# Patient Record
Sex: Male | Born: 1945
Health system: Southern US, Community
[De-identification: ages and names within clinical notes are randomized; demographics above are authoritative.]

## PROBLEM LIST (undated history)

## (undated) DIAGNOSIS — E119 Type 2 diabetes mellitus without complications: Secondary | ICD-10-CM

## (undated) DIAGNOSIS — E7800 Pure hypercholesterolemia, unspecified: Secondary | ICD-10-CM

## (undated) DIAGNOSIS — G8929 Other chronic pain: Secondary | ICD-10-CM

## (undated) DIAGNOSIS — F32A Depression, unspecified: Secondary | ICD-10-CM

## (undated) DIAGNOSIS — E785 Hyperlipidemia, unspecified: Secondary | ICD-10-CM

## (undated) DIAGNOSIS — I10 Essential (primary) hypertension: Secondary | ICD-10-CM

## (undated) DIAGNOSIS — F419 Anxiety disorder, unspecified: Secondary | ICD-10-CM

## (undated) DIAGNOSIS — F329 Major depressive disorder, single episode, unspecified: Secondary | ICD-10-CM

## (undated) DIAGNOSIS — I219 Acute myocardial infarction, unspecified: Secondary | ICD-10-CM

## (undated) DIAGNOSIS — I2510 Atherosclerotic heart disease of native coronary artery without angina pectoris: Secondary | ICD-10-CM

## (undated) DIAGNOSIS — M545 Low back pain, unspecified: Secondary | ICD-10-CM

## (undated) HISTORY — DX: Anxiety disorder, unspecified: F41.9

## (undated) HISTORY — DX: Type 2 diabetes mellitus without complications: E11.9

## (undated) HISTORY — PX: EYE SURGERY: SHX253

## (undated) HISTORY — PX: COLON SURGERY: SHX602

## (undated) HISTORY — PX: HERNIA REPAIR: SHX51

## (undated) HISTORY — PX: UMBILICAL HERNIA REPAIR: SHX196

## (undated) HISTORY — PX: BACK SURGERY: SHX140

## (undated) HISTORY — DX: Hyperlipidemia, unspecified: E78.5

---
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
## 1993-02-06 HISTORY — PX: LUMBAR DISC SURGERY: SHX700

## 2003-02-07 HISTORY — PX: CORONARY ANGIOPLASTY WITH STENT PLACEMENT: SHX49

## 2003-04-23 ENCOUNTER — Ambulatory Visit (HOSPITAL_COMMUNITY): Admission: RE | Admit: 2003-04-23 | Discharge: 2003-04-23 | Payer: Self-pay | Admitting: Internal Medicine

## 2004-01-27 DIAGNOSIS — I219 Acute myocardial infarction, unspecified: Secondary | ICD-10-CM

## 2004-01-27 HISTORY — DX: Acute myocardial infarction, unspecified: I21.9

## 2004-01-28 ENCOUNTER — Inpatient Hospital Stay (HOSPITAL_COMMUNITY): Admission: AD | Admit: 2004-01-28 | Discharge: 2004-01-31 | Payer: Self-pay | Admitting: Cardiology

## 2004-01-28 ENCOUNTER — Encounter: Payer: Self-pay | Admitting: Emergency Medicine

## 2004-01-28 ENCOUNTER — Ambulatory Visit: Payer: Self-pay | Admitting: Cardiology

## 2004-02-11 ENCOUNTER — Ambulatory Visit: Payer: Self-pay | Admitting: *Deleted

## 2004-03-10 ENCOUNTER — Ambulatory Visit: Payer: Self-pay | Admitting: Internal Medicine

## 2004-04-28 ENCOUNTER — Ambulatory Visit: Payer: Self-pay | Admitting: *Deleted

## 2004-05-13 ENCOUNTER — Ambulatory Visit (HOSPITAL_COMMUNITY): Admission: RE | Admit: 2004-05-13 | Discharge: 2004-05-13 | Payer: Self-pay | Admitting: Pulmonary Disease

## 2004-07-25 ENCOUNTER — Ambulatory Visit: Payer: Self-pay | Admitting: *Deleted

## 2004-09-06 ENCOUNTER — Ambulatory Visit: Payer: Self-pay | Admitting: *Deleted

## 2004-10-28 ENCOUNTER — Ambulatory Visit: Payer: Self-pay | Admitting: Internal Medicine

## 2005-01-25 ENCOUNTER — Ambulatory Visit: Payer: Self-pay | Admitting: *Deleted

## 2005-04-21 ENCOUNTER — Ambulatory Visit: Payer: Self-pay | Admitting: Cardiology

## 2007-11-07 ENCOUNTER — Ambulatory Visit: Payer: Self-pay | Admitting: Cardiology

## 2007-11-13 ENCOUNTER — Encounter (HOSPITAL_COMMUNITY): Admission: RE | Admit: 2007-11-13 | Discharge: 2007-12-13 | Payer: Self-pay | Admitting: Cardiology

## 2007-11-13 ENCOUNTER — Ambulatory Visit: Payer: Self-pay | Admitting: Cardiology

## 2007-11-27 ENCOUNTER — Ambulatory Visit: Payer: Self-pay | Admitting: Cardiology

## 2008-05-21 ENCOUNTER — Emergency Department (HOSPITAL_COMMUNITY): Admission: EM | Admit: 2008-05-21 | Discharge: 2008-05-21 | Payer: Self-pay | Admitting: Emergency Medicine

## 2009-02-06 HISTORY — PX: COLONOSCOPY: SHX174

## 2009-05-05 ENCOUNTER — Ambulatory Visit: Payer: Self-pay | Admitting: Internal Medicine

## 2009-05-19 ENCOUNTER — Ambulatory Visit: Payer: Self-pay | Admitting: Internal Medicine

## 2009-05-19 ENCOUNTER — Ambulatory Visit (HOSPITAL_COMMUNITY): Admission: RE | Admit: 2009-05-19 | Discharge: 2009-05-19 | Payer: Self-pay | Admitting: Internal Medicine

## 2009-05-20 ENCOUNTER — Encounter: Payer: Self-pay | Admitting: Internal Medicine

## 2009-07-19 ENCOUNTER — Ambulatory Visit (HOSPITAL_COMMUNITY): Admission: RE | Admit: 2009-07-19 | Discharge: 2009-07-19 | Payer: Self-pay | Admitting: Pulmonary Disease

## 2009-12-17 ENCOUNTER — Inpatient Hospital Stay (HOSPITAL_COMMUNITY): Admission: EM | Admit: 2009-12-17 | Discharge: 2009-12-18 | Payer: Self-pay | Admitting: Emergency Medicine

## 2010-02-06 HISTORY — PX: KNEE ARTHROSCOPY: SHX127

## 2010-03-08 NOTE — Letter (Signed)
Summary: Patient Notice, Colon Biopsy Results  [facility]  [address]   [place], [place] [address]   Phone: [phone]  Fax: [phone]       May 20, 2009   [address] [place], [place]  [address] [birth year]    Dear Steven Stevens,  I am pleased to inform you that the biopsies taken during your recent colonoscopy did not show any evidence of cancer upon pathologic examination.  Additional information/recommendations:  You should have a repeat colonoscopy examination  in 5 years.  Please call us if you are having persistent problems or have questions about your condition that have not been fully answered at this time.  Sincerely,    R. Roetta Sessions MD, FACP [facility] Ph: [phone]    Fax: [phone]   Appended Document: Patient Notice, Colon Biopsy Results letter mailed to pt  Appended Document: Patient Notice, Colon Biopsy Results reminder in computer

## 2010-03-08 NOTE — Assessment & Plan Note (Signed)
Summary: NEEDS TC,HAVING PROBLEMS.GU   Visit Type:  Initial Visit Primary Care Provider:  Juanetta Gosling  Chief Complaint:  Time for TCS.  History of Present Illness: 65 year old gentleman returns to set up a high-risk screening colonoscopy. He underwent colonoscopy by me back in 2005. He was found to have a normal rectum and colon aside from a diminutive polyp at the hepatic flexure. This was biopsied/removed. This came back as a large lymphoid aggregated on pathology. Steven Stevens  has done well since last being seen here. He has any rectal bleeding or other GI symptoms. He is very concerned about getting his colon checked given his mother's history of CRC diagnosed in her 22s. Since I last saw this nice gentleman, he did have a myocardial infarction and now has a stent and takes Plavix.   Current Medications (verified): 1)  Hyzaar 50-12.5 Mg Tabs (Losartan Potassium-Hctz) .... Take 1 Tablet By Mouth Once A Day 2)  Hytrin 5 Mg .... Take 1 Tablet By Mouth Once A Day 3)  Crestor 10 Mg Tabs (Rosuvastatin Calcium) .... Take 1 Tablet By Mouth Once A Day 4)  Zetia 10 Mg Tabs (Ezetimibe) .... Take 1 Tablet By Mouth Once A Day 5)  Plavix 75 Mg Tabs (Clopidogrel Bisulfate) .... Take 1 Tablet By Mouth Once A Day 6)  Asa 325 Mg .... Take 1 Tablet By Mouth Once A Day 7)  Metoprolol Tartrate 25 Mg Tabs (Metoprolol Tartrate) .... Take 1 Tablet By Mouth Once A Day 8)  Alprazolam 1 Mg Tabs (Alprazolam) .... Take 1 Tablet By Mouth Two Times A Day 9)  Glucovance 5-500 Mg Tabs (Glyburide-Metformin) .... Take 1 Tablet By Mouth Two Times A Day 10)  Flax Seed Oil .... Take 1 Tablet By Mouth Once A Day 11)  Vicodin Hp 10-660 Mg Tabs (Hydrocodone-Acetaminophen) .... Take 1 Tablet By Mouth Four Times A Day As Needed  Allergies (verified): No Known Drug Allergies  Past History:  Family History: Last updated: June 03, 2009 Father: Deceased age 86   CHF Mother: Deceased age 93  Colon Cancer Siblings: 3 brothers        one deceased age 49 aneurysm  Social History: Last updated: 06/03/09 Marital Status: Married Children: 3   healthy Occupation: Retired      Press photographer  Past Medical History: Diabetic  hypertension Hypercholesteremia MI Anxiety Back problems  (discs removed in 1998)  Past Surgical History: Back Surgery Umbilical Hernia Heart stents  01/2004  Family History: Father: Deceased age 33   CHF Mother: Deceased age 44  Colon Cancer Siblings: 3 brothers       one deceased age 83 aneurysm  Social History: Marital Status: Married Children: 3   healthy Occupation: Retired      Press photographer  Vital Signs:  Patient profile:   65 year old male Height:      68.5 inches Weight:      321 pounds BMI:     48.27 Temp:     97.9 degrees F oral Pulse rate:   64 / minute BP sitting:   120 / 78  (left arm) Cuff size:   large  Vitals Entered By: Cloria Spring LPN (06-03-09 3:16 PM)  Physical Exam  General:  very pleasant alert well groomed gentleman resting comfortably Eyes:  no scleral icterus. Lungs:  clear to auscultation Heart:  regular rate and rhythm without murmur gallop rub Abdomen:  obese positive bowel sounds soft, nontender without appreciable mass or organomegaly Rectal:  deferred until time of colonoscopy.  Impression & Recommendations: Impression: 65 year old gentleman presents for a screening colonoscopy.  History of colon cancer in his mother but at advanced age. The patient very much wants to have another colonoscopy at this time although guidelines have now changed somewhat since 2005 examination. He is currently not having a lower GI tract symptoms.  Recommendations: All things considered, we'll go ahead and off for Steven Stevens a screening colonoscopy at this time. Risk, benefits, limitations, alternatives and imponderables have been reviewed. His questions have been answered. He is agreeable. We'll set up a screening colonoscopy the very near future. Further  recommendations to follow.         Appended Document: Orders Update    Clinical Lists Changes  Orders: Added new Service order of New Patient Level III [phone]) - Signed

## 2010-03-11 NOTE — Letter (Signed)
Summary: TCS ORDER  TCS ORDER   Imported By: Ave Filter 05/05/2009 16:07:06  _____________________________________________________________________  External Attachment:    Type:   Image     Comment:   External Document

## 2010-04-19 LAB — BASIC METABOLIC PANEL
Creatinine, Ser: 1.14 mg/dL (ref 0.4–1.5)
GFR calc Af Amer: >60 mL/min (ref >60)
Glucose, Bld: 107 mg/dL — ABNORMAL HIGH (ref 70–99)
Potassium: 3.7 mEq/L (ref 3.5–5.1)
Sodium: 138 mEq/L (ref 135–145)

## 2010-04-19 LAB — URINALYSIS, ROUTINE W REFLEX MICROSCOPIC
Bilirubin Urine: NEGATIVE
Glucose, UA: NEGATIVE mg/dL
Protein, ur: NEGATIVE mg/dL
Specific Gravity, Urine: 1.025 (ref 1.005–1.030)
pH: 5.5 (ref 5.0–8.0)

## 2010-04-19 LAB — CARDIAC PANEL(CRET KIN+CKTOT+MB+TROPI)
CK, MB: 3 ng/mL (ref 0.3–4.0)
CK, MB: 3.2 ng/mL (ref 0.3–4.0)
Relative Index: 0.9 (ref 0.0–2.5)
Troponin I: 0.04 ng/mL (ref 0.00–0.06)

## 2010-04-19 LAB — URINE CULTURE
Colony Count: >=100000
Culture  Setup Time: 201111120544

## 2010-04-19 LAB — DIFFERENTIAL
Lymphocytes Relative: 42 % (ref 12–46)
Monocytes Relative: 8 % (ref 3–12)
Neutrophils Relative %: 47 % (ref 43–77)

## 2010-04-19 LAB — CBC
MCH: 30 pg (ref 26.0–34.0)
MCV: 89.1 fL (ref 78.0–100.0)
RBC: 4.45 MIL/uL (ref 4.22–5.81)
WBC: 8.7 10*3/uL (ref 4.0–10.5)

## 2010-04-19 LAB — POCT CARDIAC MARKERS: Myoglobin, poc: 81.3 ng/mL (ref 12–200)

## 2010-04-19 LAB — BRAIN NATRIURETIC PEPTIDE: Pro B Natriuretic peptide (BNP): <30 pg/mL (ref 0.0–100.0)

## 2010-04-19 LAB — GLUCOSE, CAPILLARY

## 2010-04-19 LAB — URINE MICROSCOPIC-ADD ON

## 2010-04-22 ENCOUNTER — Other Ambulatory Visit (HOSPITAL_COMMUNITY): Payer: Self-pay | Admitting: Pulmonary Disease

## 2010-04-22 DIAGNOSIS — M25561 Pain in right knee: Secondary | ICD-10-CM

## 2010-04-26 ENCOUNTER — Other Ambulatory Visit (HOSPITAL_COMMUNITY): Payer: Self-pay

## 2010-05-18 LAB — DIFFERENTIAL
Eosinophils Relative: 1 % (ref 0–5)
Lymphocytes Relative: 24 % (ref 12–46)
Lymphs Abs: 2.1 10*3/uL (ref 0.7–4.0)
Monocytes Absolute: 0.5 10*3/uL (ref 0.1–1.0)
Neutro Abs: 5.8 10*3/uL (ref 1.7–7.7)
Neutrophils Relative %: 69 % (ref 43–77)

## 2010-05-18 LAB — URINALYSIS, ROUTINE W REFLEX MICROSCOPIC
Bilirubin Urine: NEGATIVE
Glucose, UA: NEGATIVE mg/dL
Leukocytes, UA: NEGATIVE
Nitrite: NEGATIVE
Specific Gravity, Urine: 1.02 (ref 1.005–1.030)
pH: 5.5 (ref 5.0–8.0)

## 2010-05-18 LAB — CBC
HCT: 38.5 % — ABNORMAL LOW (ref 39.0–52.0)
MCHC: 34.2 g/dL (ref 30.0–36.0)
MCV: 90.2 fL (ref 78.0–100.0)
RBC: 4.28 MIL/uL (ref 4.22–5.81)
RDW: 14.9 % (ref 11.5–15.5)
WBC: 8.4 10*3/uL (ref 4.0–10.5)

## 2010-05-18 LAB — COMPREHENSIVE METABOLIC PANEL
Chloride: 102 mEq/L (ref 96–112)
Creatinine, Ser: 1.38 mg/dL (ref 0.4–1.5)
GFR calc Af Amer: >60 mL/min (ref >60)
GFR calc non Af Amer: 52 mL/min — ABNORMAL LOW (ref >60)
Potassium: 3.8 mEq/L (ref 3.5–5.1)

## 2010-05-18 LAB — LIPASE, BLOOD: Lipase: 24 U/L (ref 11–59)

## 2010-05-18 LAB — URINE MICROSCOPIC-ADD ON

## 2010-06-21 NOTE — Assessment & Plan Note (Signed)
[facility]                       [place] CARDIOLOGY OFFICE NOTE   LAJUAN, KOVALESKI                      MRN:          [id number]  DATE:11/27/2007                            DOB:          14-Nov-1945    PRIMARY CARE PHYSICIAN:  Ramon Dredge L. Juanetta Gosling, MD   REASON OF VISIT:  Followup cardiac testing.   HISTORY OF PRESENT ILLNESS:  Mr. Goodlin returns after his recent Myoview  following our last visit earlier in the month.  He had no diagnostic  electrocardiographic changes with exercise achieving 7 METS and a peak  heart rate of 136 beats per minute, which was 85% of his maximum  predicted heart rate.  He did have a small inferior/inferior apical  defect most consistent with scar with some evidence of ischemia and this  would be very consistent with his previous coronary anatomy.  There was  some increase in the transient ischemic dilatation ratio suggesting the  possibility of balanced ischemia.  This may be reflective of his  concurrent occlusion of the mid left anterior descending with distal  collateralization.  Symptomatically, he reports having less shortness of  breath.  He has been trying to do some walking, up to a mile at a time  since I saw him.  Today we discussed the testing and, at this point, we  will continue medical therapy and observation of symptoms.  Certainly,  if he has progression, we can consider a cardiac catheterization.   ALLERGIES:  No known drug allergies.   MEDICATIONS:  1. Aspirin 325 mg p.o. daily.  2. Crestor 10 mg p.o. daily.  3. Terazosin 5 mg p.o. daily.  4. Hyzaar 50/12.5 mg p.o. daily.  5. Zetia 10 mg p.o. daily.  6. Metoprolol 12.5 mg p.o. b.i.d.  7. Plavix 75 mg p.o. daily.  8. Glyburide/metformin 5/500 mg p.o. b.i.d.  9. Xanax 1 mg p.o. p.r.n.   REVIEW OF SYSTEMS:  As described in the history of present illness,  otherwise negative.   PHYSICAL EXAMINATION:  VITAL SIGNS:  Blood pressure is 118/78,  heart  rate is 66, weight is 308 pounds.  GENERAL:  This is a morbidly obese male in no acute distress.  NECK:  No elevated jugular venous pressure.  LUNGS:  Clear without labored breathing.  CARDIAC:  Regular rate and rhythm.  No S3 gallop or murmur.  EXTREMITIES:  Exhibit no significant pitting edema.   IMPRESSION AND RECOMMENDATIONS:  Coronary disease status post drug-  eluting stent placement to the mid-to-distal right coronary artery in  2005 with known residual occlusion of the mid left anterior descending  associated with distal collateralization.  Recent Myoview results are  noted above.  At this point, would plan to continue medical therapy.  I  will plan to see him back over the next 6 months.  He will let me know  in advance if symptoms worsen.     Jonelle Sidle, MD  Electronically Signed    SGM/MedQ  DD: 11/27/2007  DT: 11/28/2007  Job #: [id number]   cc:   Ramon Dredge L. Juanetta Gosling, M.D.

## 2010-06-21 NOTE — Assessment & Plan Note (Signed)
[facility]                       [place] CARDIOLOGY OFFICE NOTE   Steven, Stevens                      MRN:          [id number]  DATE:11/07/2007                            DOB:          Jun 22, 1945    PRIMARY CARE PHYSICIAN:  Ramon Dredge L. Juanetta Gosling, MD   REASON FOR VISIT:  Routine cardiac followup.   HISTORY OF PRESENT ILLNESS:  This is my first meeting with [name].  He is a very pleasant gentleman last seen in the office in 2006.  He has  a history of coronary artery disease status post presentation with an  acute coronary syndrome back in 2005, at which time he underwent cardiac  catheterization which demonstrated an ejection fraction of 55% with  inferior basal hypokinesis, total occlusion of the mid-to-distal right  coronary artery with severe distal disease at the point of the posterior  descending branch.  There were left-to-right collaterals to this area,  and ultimately the patient underwent placement of a drug-eluting stent  with successful angiographic results.  Also noted was chronic  collateralization of the distal left anterior descending, which had a  40% eccentric stenosis after the first diagonal with occlusion of the  vessel distal to the second diagonal branch.  He has actually done  fairly well on medical therapy.  Symptomatically, he has been stable  with the exception of progressive dyspnea on exertion.  He does note  that he has not been exercising as regularly as he was approximately 3  months ago due to some problems on his back.  He has been on a good  medical regiment with lipids followed under the direction of Dr.  Juanetta Gosling.  His last LDL was 73 in September 2008.  He is now on an oral  hypoglycemic medicine with diagnosis of diabetes mellitus since he was  last seen here.  He has not had a followup ischemic evaluation.   ALLERGIES:  No known drug allergies.   PRESENT MEDICATIONS:  1. Aspirin 325 mg p.o. daily.  2. Crestor 10 mg p.o. daily.  3. Terazosin 5 mg p.o. daily.  4. Hyzaar 50/12.5 mg p.o. daily.  5. Zetia 10 mg p.o. daily.  6. Metoprolol 12.5 mg p.o. b.i.d.  7. Plavix 75 mg p.o. daily.  8. Glyburide/metformin  5/500 mg p.o. b.i.d.  9. Xanax 1 mg p.r.n.  10.Hydrocodone p.r.n.  11.Previous Levitra p.r.n.   REVIEW OF SYSTEMS:  As described in the history of present illness.  He  still has difficulties with erectile dysfunction.  No palpitations,  orthopnea, or PND.  Otherwise negative.   PHYSICAL EXAMINATION:  VITAL SIGNS:  Blood pressure today is 118/80,  heart rate is 70 and regular, weight is 309 pounds up from 294.  GENERAL:  He is a morbidly obese male in no acute distress.  HEENT:  Conjunctiva is normal.  Oropharynx is clear.  NECK:  Supple.  No elevated jugular venous pressure.  No loud bruits.  No thyromegaly is noted.  LUNGS:  Clear without labored breathing at rest.  CARDIAC:  Regular rate and rhythm.  No S3, gallop or pericardial rub.  ABDOMEN:  Obese.  Unable to adequately palpate liver edge.  Bowel sounds  present.  EXTREMITIES:  No trace edema.  Distal pulses are 2+.  SKIN:  Warm and dry.  MUSCULOSKELETAL:  No kyphosis noted.  NEUROPSYCHIATRIC:  The patient is alert and oriented x3.  Affect is  appropriate.   IMPRESSION AND RECOMMENDATIONS:  1. Cardiovascular disease status post drug-eluting stent placement to      the mid-to-distal right coronary artery back in 2005.  The patient      has residual occlusion of the mid left anterior descending with      collateralization distally, and he has done fairly well      symptomatically since then with previously documented normal      ejection fraction of 55%.  He is now experiencing dyspnea on      exertion over the last few months, although no frank angina.  In      the meanwhile, he has also been diagnosed with diabetes mellitus.      His medical regimen looks good, and we talked about proceeding with      a followup  adenosine Myoview to assess for ischemic burden.  I will      have him come back to the office over the next few weeks to discuss      the results and plan.  2. Hyperlipidemia, on Crestor.  This has been followed by Dr. Juanetta Gosling.      I would aim for aggressive LDL control around 70.  3. Hypertension, well controlled today.     Jonelle Sidle, MD  Electronically Signed    SGM/MedQ  DD: 11/07/2007  DT: 11/08/2007  Job #: [id number]   cc:   Ramon Dredge L. Juanetta Gosling, M.D.

## 2010-06-24 NOTE — Cardiovascular Report (Signed)
NAMEHYMAN, CROSSAN NO.:  [id number]   MEDICAL RECORD NO.:  [id number]          PATIENT TYPE:  OIB   LOCATION:  2931                         FACILITY:  [facility]   PHYSICIAN:  Arturo Morton. Riley Kill, M.D. [facility] OF BIRTH:  1946-01-23   DATE OF PROCEDURE:  01/28/2004  DATE OF DISCHARGE:                              CARDIAC CATHETERIZATION   INDICATIONS:  Mr. Elzey is a 65 year old gentleman who presents with chest  pain.  He has not had definite ST abnormalities, but enzymes have been  positive.  He was transferred to the cardiac catheterization for further  evaluation and treatment.   PROCEDURES:  1.  Left heart catheterization.  2.  Selective coronary arteriography.  3.  Selective left ventriculography.  4.  Temporary transvenous pacer implantation.  5.  Percutaneous stenting of the right coronary artery.   DESCRIPTION OF PROCEDURE:  The patient was brought to the catheterization  lab and prepped and draped in the usual fashion.  Through an anterior  puncture, the  right femoral artery was easily entered.  Central aortic and  left ventricular pressures were measured with a pigtail.  Ventriculography  was performed in the right RAO projection.  Coronary arteriography was  performed with standard Judkins catheters.  He was noted to have a totally  occluded and what appeared to be a fresh occlusion of the right coronary  artery.  Plans were made for percutaneous intervention.  Because of  bradycardia and RCA occlusion, it was felt that a temporary transvenous  pacer would be required.  Because of this, the femoral vein was entered and  a 6 French sheath was placed.  Temporary transvenous pacer was passed to the  RV apex where it was tested and found to work appropriately.  Intravenous  heparin was given to prolong the ACT between 200 and 300 seconds with  eptifibatide.  The right appeared to be the acute lesion and therefore the  right was crossed with a Hi-Torque  Floppy wire.  Dilatations were initially  done with 2-0 balloon, then a 2.25 balloon.  We debated about whether to put  in a 2.25 non drug-eluting stent or a 2.5 drug-eluting stent.  We leaned in  the direction of a drug-eluting stent.  A 28 x 2.5 Cypher stent was then  delivered to the distal lesion and taken up to about 11 atmospheres.  Post  dilatation was done with 2.5 mm Quantum Maverick balloon up to 14  atmospheres.  There was marked improvement in the appearance of the artery  although there still remains some TIMI-2 flow in the distal vessel.  This  was thought to be possibly due to the presence of collaterals previously.  The patient's pain was much improved.  It was felt that the LAD occlusion  was not acute and therefore could be managed medically.   At the completion of the procedure, all catheters were subsequently removed  and the femoral sheath sewn into place.  The temporary transvenous pacer was  also removed.   He was taken to the holding area in satisfactory clinical condition.   HEMODYNAMIC  DATA:  1.  Central aorta:  101/69, mean 84.  2.  Left ventricle:  121/26.  3.  No aortic or left ventricular gradient on pullback across the aortic      valve.   ANGIOGRAPHIC DATA:  1.  Ventriculography was performed in the RAO projection.  There was      inferior basal hypokinesis with ejection fraction estimated at 55%.  2.  The left main was free of critical disease.  3.  The LAD has a 40% eccentric stenosis after the diagonal and before the      septal.  After the second diagonal and septal, the vessel was totally      occluded.  It fills by bridging collaterals from the septal perforators      as well as the right mid coronary vessel.  The second diagonal has 50%      segmental narrowing.  4.  The right coronary artery is fairly smooth down to the site of total      occlusion and then is totally occluded.  The distal vessel is fairly      severely diseased up to the  point of the PDA.  This entire area was      stented using a 28 x 2.5 Cypher stent and post dilated with a 2.5      Quantum Maverick.  There remains some TIMI-2 flow in the distal vessel.      We questioned whether there might be some diffuse disease in the distal      vessel prior to the PDA take off, but after careful analysis, it was      felt that there was a fairly large caliber lumen.   CONCLUSION:  1.  Acute myocardial infarction, non-ST-elevation due to occlusion of the      right coronary artery with left-to-right collaterals.  2.  Successful percutaneous stenting of the distal right coronary using a      Cypher drug-eluting stent.  3.  Chronic collateralization of the distal left anterior descending artery.   RECOMMENDATIONS:  At the present time, we will continue on the same medical  regimen.  The patient has been enrolled in the TRITAN study and has received  TRITAN study drug.  Hopefully, he will continue to do well.  He will need  close followup in the office.       TDS/MEDQ  D:  01/28/2004  T:  01/29/2004  Job:  [id number]   cc:   Ramon Dredge L. Juanetta Gosling, M.D.  [address]  [place]  [place] [address]  Fax: [phone]

## 2010-06-24 NOTE — Discharge Summary (Signed)
Steven Stevens, VANOVERBEKE NO.:  [id number]   MEDICAL RECORD NO.:  [id number]          PATIENT TYPE:  OIB   LOCATION:  2011                         FACILITY:  [facility]   PHYSICIAN:  Willa Rough, M.D.     DATE OF BIRTH:  1945-06-26   DATE OF ADMISSION:  01/28/2004  DATE OF DISCHARGE:  01/31/2004                           DISCHARGE SUMMARY - REFERRING   SUMMARY OF HISTORY:  Mr. Colan is a 65 year old African-American male who  on the evening prior to admission at approximately 11 o'clock developed a  right sternal chest discomfort.  He felt that his sensation was similar  symptoms relieved with antacids or belching that he has had over these last  1-2 weeks.  However, these did not resolve.  He went to [facility] at  approximately 8 a.m.  He received 3 sublingual nitroglycerin, aspirin,  nitroglycerin drip, IV heparin, IV Lopressor and Integrilin reducing his  discomfort for 3 on a scale of 0/10.  [facility] transported him to [facility].   HISTORY:  1.  Obesity.  2.  Hyperlipidemia.  3.  Tobacco use.   LABORATORY DATA:  At [facility] H&H was 14.5 and 41.5 normal  indices, platelets 234, WBCs 10.1.  Subsequent hematologies were stable.  His last H&H was drawn on the 23rd.  This was 13.3 and 37.8, platelets 234,  WBCs 9.7.  Admission PTT was greater than 200, PT 14.7, INR 1.2.  Sodium  128, potassium 4.2, BUN 12, creatinine 1.2.  At [facility] LFTs were drawn  on the 23rd that showed a low alkaline phosphatase at 35 and SGOT elevated  at 106.  At [facility] total CK was 1775 with an MB of 178.8,  relative index 10.1.  Subsequent CK-MBs were declining.  The first troponin  that was drawn on the 23rd was 13.57.  Hemoglobin A1C on the 23rd was 6.  Total cholesterol 219, triglycerides 193, HDL 39, LDL 141.  TSH 1.832.  EKG  on admission showed normal sinus rhythm, normal axis, inferior Q waves,  early R waves.  Subsequent EKGs were essentially  the same.  Chest x-ray  obtained on the 22nd showed mild cardiomegaly and possible mild interstitial  edema with bibasilar atelectasis.  On the 24th two-view chest x-ray was  performed showed mild COPD without edema, mild diffuse peribronchial  thickening.   HOSPITAL COURSE:  Mr. Hineman was taken emergently to the cardiac  catheterization lab by Dr. Riley Kill.  According to Dr. Rosalyn Charters note, his EF  was 55%, inferior hypokinesis, 40% proximal LAD after the first diagonal,  50% proximal second diagonal, 100% mid RCA with right and left-to-left  collaterals.  He had 100% distal RCA.  Utilizing a CYPHER stent, Dr. Riley Kill  reduced this lesion from 100% to 0% restoring TIMI II flow.  Dr. Riley Kill  noted that the patient has a past history of ADVERSE REACTION TO LIPITOR  thus Crestor was begun and to add a beta-blocker.  Post catheterization, he  was admitted to the coronary care unit.  He was placed in the TRITON  research  trial.  Tobacco cessation consult was also performed and cardiac  rehab assisted with education and ambulation.  On the 23rd and 24th the  patient wanted to go home; however, given this recent myocardial infarction  it was felt that he should stay another 24 hours.  By the 25th he was doing  well without any further problems with bradycardia, chest discomfort or  shortness of breath.  Dr. Myrtis Ser reviewed and felt that this patient could be  discharged home.   DISCHARGE DIAGNOSES:  1.  Inferior myocardial infarction status post stenting to the right      coronary artery utilizing CYPHER stent.  2.  Hyperlipidemia, Crestor initiated.  3.  Sinus bradycardia.  4.  Hypertension.  5.  History of tobacco use.  6.  History as previously.   DISPOSITION:  He is discharged home.   DISCHARGE MEDICATIONS:  1.  His medications included the TRITON study drug daily.  2.  Aspirin 325 mg daily.  3.  His Cozaar was decreased to 25 mg daily.  4.  Xanax 1 mg q.h.s.  5.  Terazosin 5 mg  daily.  6.  Crestor 10 mg daily.  7.  Nitroglycerin 0.4 mg p.r.n.  8.  Toprol-XL 25 mg daily.   He is advised no lifting, driving, sexual activity, or heavy exertion for  two weeks.  Maintain low salt, fat, cholesterol diet.  If he has any  problems with her catheterization site, he was asked to call.  The research  team will be contacting him for follow up appointment.  He was asked to call  on Tuesday to the [place] office to arrange a 2-3 week appointment with  Dr. Dorethea Clan.  He was advised no smoking or tobacco products.       EW/MEDQ  D:  01/31/2004  T:  02/01/2004  Job:  [id number]   cc:   Ramon Dredge L. Juanetta Gosling, M.D.  [address]  [place]  [place] [address]  Fax: [phone]   Arturo Morton. Riley Kill, M.D. [facility]

## 2010-06-24 NOTE — Op Note (Signed)
NAME:  Steven Stevens, RUHLMAN                         ACCOUNT NO.:  [id number]   MEDICAL RECORD NO.:  [id number]                   PATIENT TYPE:  AMB   LOCATION:  DAY                                  FACILITY:  [facility]   PHYSICIAN:  R. Roetta Sessions, M.D.              DATE OF BIRTH:  Mar 17, 1945   DATE OF PROCEDURE:  04/23/2003  DATE OF DISCHARGE:                                 OPERATIVE REPORT   PROCEDURE:  High-risk screening colonoscopy.   ENDOSCOPIST:  Gerrit Friends. Rourk, M.D.   INDICATIONS FOR PROCEDURE:  The patient is a 65 year old gentleman with no  GI symptoms whose mother succumbed to colorectal cancer at age 46.  He is  referred over for colorectal cancer screening.  He has never had his lower  GI tract imaged previously.  Colonoscopy is now being done as a screening  maneuver.  This approach has been discussed with the patient at length.  The  potential risks, benefits, and alternatives have been reviewed; questions  answered.   PROCEDURE NOTE:  O2 saturation, blood pressure, pulse and respirations were  monitored throughout the entire procedure.  Conscious sedation: Versed 2 mg IV, Demerol 50 mg IV.   INSTRUMENT:  Olympus CF-140 colonoscope.   FINDINGS:  Digital rectal exam revealed no abnormalities.   ENDOSCOPIC FINDINGS:  The prep was adequate.   RECTUM:  Examination of the rectal mucosa including the retroflex view of  the anal verge revealed no abnormalities.   COLON:  The colonic mucosa was surveyed from the rectosigmoid junction  through the left transverse and right colon to the area of the appendiceal  orifice, ileocecal valve, and cecum.  These structures were well seen and  photographed for the record.   From this level the scope was slowly withdrawn.  All previously mentioned  mucosal surfaces were again seen.  The patient was noted to have a 4-mm  polyp at the hepatic flexure and some early shallow left-sided diverticula.  The remainder of the colonic mucosa  appeared normal.  The polyp at the  hepatic flexure was cold biopsied/removed.  The patient tolerated the  procedure well and was reacted in endoscopy.   IMPRESSION:  1. Rectum, normal colon.  2. Shallow, subdiverticular dimunitive polyp in the hepatic flexure cold     biopsied/removed.  3. The remainder of the colonic mucosa appeared normal.   RECOMMENDATIONS:  1. Follow up on path.  2. Further recommendations to follow.      ___________________________________________                                            Jonathon Bellows, M.D.   RMR/MEDQ  D:  04/23/2003  T:  04/23/2003  Job:  [id number]   cc:   Ramon Dredge L. Juanetta Gosling, M.D.  [address]  [place]  [place] [address]  Fax: [phone]   R. Roetta Sessions, M.D.  [address]  [place]  [place] [address]  Fax: [phone]

## 2010-06-24 NOTE — H&P (Signed)
NAMEJEVAUGHN, Steven Stevens NO.:  [id number]   MEDICAL RECORD NO.:  [id number]          PATIENT TYPE:  OIB   LOCATION:  2931                         FACILITY:  [facility]   PHYSICIAN:  Steven Stevens, M.D. [facility] OF BIRTH:  [birth year]   DATE OF ADMISSION:  01/28/2004  DATE OF DISCHARGE:                                HISTORY & PHYSICAL   PRIMARY CARE PHYSICIAN:  Dr. Juanetta Gosling.   PRIMARY CARDIOLOGIST:  New, and will be Dr. Riley Stevens unless he requests  followup in [place].   CHIEF COMPLAINT:  Chest pain.   HISTORY OF PRESENT ILLNESS:  [name] is a 65 year old African-American  male with no known history of coronary artery disease. He had onset of  substernal/right sternal chest pain at approximately 11:00 last night. He  has had symptoms similar to this before that were relieved by antacids or  belching. He gets these symptoms about one or two times a week. The symptoms  he had last p.m. did not resolve despite his usual home medications. He got  very little sleep and went to [facility] emergency room about 8 a.m.  (driving himself). There, he received 3 sublingual nitroglycerin, 325 mg of  aspirin. He was started on a nitroglycerin drip, IV heparin, given Lopressor  IV, and Integrilin. His pain was down to a 3/10 when [facility] picked him up.  They gave him 6 mg total of morphine and increased his nitroglycerin from 5  mcg to 50 mcg, and his chest pain now is less than 1/10. His states his pain  is almost completely gone. He had some slight shortness of breath but no  vomiting and denies diaphoresis with the pain.   PAST MEDICAL HISTORY:  1.  Significant for hypertension.  2.  Obesity.  3.  Tobacco use  4.  Family history of coronary artery disease.   PAST SURGICAL HISTORY:  1.  He has had back surgery and has ongoing back problems.  2.  He has also had an umbilical hernia repair.   ALLERGIES:  No known drug allergies.   MEDICATIONS:  1.  Cozaar 50 mg a  day.  2.  Terazosin 5 mg a day.  3.  Xanax 1 mg q.h.s.  4.  Vicodin p.r.n.  5.  Aspirin 325 mg q.d.   SOCIAL HISTORY:  He lives alone in [place] but has two daughters that live in  the area. He is disabled secondary to his back problems. He smokes a pipe  but does not abuse alcohol or drugs.   FAMILY HISTORY:  His parents were both in their 54s when they died and had  no history of heart disease, but he has a brother who his alive but had a MI  about 5 years ago.   REVIEW OF SYSTEMS:  Significant for chest pain as described above. He has  had some dyspnea on exertion and orthopnea. He has an occasional cough but  denies PND or syncope or claudication symptoms. He denies any genitourinary  symptoms and states that he has no problems with his prostate. He has  chronic arthralgias  and back pain. He has occasional reflux symptoms but  denies any hematemesis, hemoptysis, or melena. Review of systems is  otherwise negative.   PHYSICAL EXAMINATION:  VITAL SIGNS:  Temperature 97.2, blood pressure  104/55, heart rate 55, respiratory rate 16 to 18, and O2 saturation is 97%  on 2 liters.  GENERAL:  He is a well-developed, obese, African-American male in no acute  distress.  HEENT:  His head is normocephalic, atraumatic. Pupils are equal, round, and  reactive to light and accommodation. Extraocular movements intact. Sclerae  are clear. Nares without difficulty.  NECK:  Supple and there is no lymphadenopathy, thyromegaly, bruit, or JVD  noted.  CARDIOVASCULAR:  His heart is regular in rate and rhythm with a S1, S2, and  a possible S4 but no significant murmur or rub is noted. His distal pulses  are 2+ in all four extremities, and no femoral bruits are appreciated.  LUNGS:  Clear to auscultation bilaterally.  SKIN:  No rashes or lesions are noted.  ABDOMEN:  Firm and nontender with active bowel sounds. No hepatosplenomegaly  by percussion.  EXTREMITIES:  There is no clubbing, cyanosis, or  edema noted.  MUSCULOSKELETAL:  There is joint deformity or effusion, and no spine or CVA  tenderness.  NEUROLOGICAL:  He is alert and oriented with cranial nerves II-XII grossly  intact.   STUDIES:  Chest x-ray showed cardiomegaly and possible mild interstitial  edema. EKG:  Rate is sinus rhythm with frequent PACs, rate 65. He has  inferior Q waves. There is no old to compare.   LABORATORY VALUES:  His myoglobin was greater than 500, his MB fraction was  40.4, and his troponin was 0.87 on point of care markers. Hemoglobin 14.4,  hematocrit 41.5, WBCs 10.1, platelets 234. Sodium 128, potassium 4.2,  chloride 99, CO2 23, BUN 12, creatinine 1.2, glucose 129.   ASSESSMENT/PLAN:  1.  Acute myocardial infarction. His cardiac enzymes were elevated, and he      has inferior Q waves with ongoing pain. He will be taken urgently to the      catheterization lab.  2.  Hyperglycemia. Check hemoglobin A1c.  3.  Unknown lipid status. Check lipids in a.m.  4.  Ongoing tobacco use. Smoking cessation consult.  5.  Hypertension. Add beta blocker.  6.  He is otherwise stable and will be continued on his home medications for      his back.       RB/MEDQ  D:  01/28/2004  T:  01/29/2004  Job:  [id number]

## 2011-03-20 ENCOUNTER — Ambulatory Visit (HOSPITAL_COMMUNITY)
Admission: RE | Admit: 2011-03-20 | Discharge: 2011-03-20 | Disposition: A | Discharge status: Home / Self Care | Payer: Medicare Other | Source: Ambulatory Visit | Attending: Pulmonary Disease | Admitting: Pulmonary Disease

## 2011-03-20 ENCOUNTER — Other Ambulatory Visit (HOSPITAL_COMMUNITY): Payer: Self-pay | Admitting: Pulmonary Disease

## 2011-03-20 DIAGNOSIS — M25579 Pain in unspecified ankle and joints of unspecified foot: Secondary | ICD-10-CM | POA: Insufficient documentation

## 2011-03-20 DIAGNOSIS — IMO0002 Reserved for concepts with insufficient information to code with codable children: Secondary | ICD-10-CM

## 2011-03-21 ENCOUNTER — Emergency Department (HOSPITAL_COMMUNITY)
Admission: EM | Admit: 2011-03-21 | Discharge: 2011-03-21 | Disposition: A | Discharge status: Home / Self Care | Payer: Medicare Other | Attending: Emergency Medicine | Admitting: Emergency Medicine

## 2011-03-21 ENCOUNTER — Encounter (HOSPITAL_COMMUNITY): Payer: Self-pay | Admitting: *Deleted

## 2011-03-21 ENCOUNTER — Emergency Department (HOSPITAL_COMMUNITY): Payer: Medicare Other

## 2011-03-21 DIAGNOSIS — E7800 Pure hypercholesterolemia, unspecified: Secondary | ICD-10-CM | POA: Insufficient documentation

## 2011-03-21 DIAGNOSIS — W268XXA Contact with other sharp object(s), not elsewhere classified, initial encounter: Secondary | ICD-10-CM | POA: Insufficient documentation

## 2011-03-21 DIAGNOSIS — S91339A Puncture wound without foreign body, unspecified foot, initial encounter: Secondary | ICD-10-CM

## 2011-03-21 DIAGNOSIS — E119 Type 2 diabetes mellitus without complications: Secondary | ICD-10-CM | POA: Insufficient documentation

## 2011-03-21 DIAGNOSIS — M7730 Calcaneal spur, unspecified foot: Secondary | ICD-10-CM

## 2011-03-21 DIAGNOSIS — I10 Essential (primary) hypertension: Secondary | ICD-10-CM | POA: Insufficient documentation

## 2011-03-21 DIAGNOSIS — S91309A Unspecified open wound, unspecified foot, initial encounter: Secondary | ICD-10-CM | POA: Insufficient documentation

## 2011-03-21 DIAGNOSIS — I252 Old myocardial infarction: Secondary | ICD-10-CM | POA: Insufficient documentation

## 2011-03-21 HISTORY — DX: Essential (primary) hypertension: I10

## 2011-03-21 HISTORY — DX: Acute myocardial infarction, unspecified: I21.9

## 2011-03-21 HISTORY — DX: Pure hypercholesterolemia, unspecified: E78.00

## 2011-03-21 MED ORDER — TETANUS-DIPHTHERIA TOXOIDS TD 5-2 LFU IM INJ
0.5000 mL | INJECTION | Freq: Once | INTRAMUSCULAR | Status: AC
  Administered 2011-03-21: 0.5 mL via INTRAMUSCULAR
  Filled 2011-03-21: qty 0.5

## 2011-03-21 NOTE — Discharge Instructions (Signed)
Heel Spur °A heel spur is a hook of bone that can form on the calcaneus (the heel bone and the largest bone of the foot). Heel spurs are often associated with plantar fasciitis and usually come in people who have had the problem for an extended period of time. The cause of the relationship is unknown. The pain associated with them is thought to be caused by an inflammation (soreness and redness) of the plantar fascia rather than the spur itself. The plantar fascia is a thick fibrous like tissue that runs from the calcaneus (heel bone) to the ball of the foot. This strong, tight tissue helps maintain the arch of your foot. It helps distribute the weight across your foot as you walk or run. Stresses placed on the plantar fascia can be tremendous. When it is inflamed normal activities become painful. Pain is worse in the morning after sleeping. After sleeping the plantar fascia is tight. The first movements stretch the fascia and this causes pain. As the tendon loosens, the pain usually gets better. It often returns with too much standing or walking.  °About 70% of patients with plantar fasciitis have a heel spur. About half of people without foot pain also have heel spurs. °DIAGNOSIS  °The diagnosis of a heel spur is made by X-ray. The X-ray shows a hook of bone protruding from the bottom of the calcaneus at the point where the plantar fascia is attached to the heel bone.  °TREATMENT °· It is necessary to find out what is causing the stretching of the plantar fascia. If the cause is over-pronation (flat feet), orthotics and proper foot ware may help.  °· Stretching exercises, losing weight, wearing shoes that have a cushioned heel that absorbs shock, and elevating the heel with the use of a heel cradle, heel cup, or orthotics may all help. Heel cradles and heel cups provide extra comfort and cushion to the heel, and reduce the amount of shock to the sore area.  °AVOIDING THE PAIN OF PLANTAR FASCIITIS AND HEEL  SPURS °· Consult a sports medicine professional before beginning a new exercise program.  °· Walking programs offer a good workout. There is a lower chance of overuse injuries common to the runners. There is less impact and less jarring of the joints.  °· Begin all new exercise programs slowly. If problems or pains develop, decrease the amount of time or distance until you are at a comfortable level.  °· Wear good shoes and replace them regularly.  °· Stretch your foot and the heel cords at the back of the ankle (Achilles tendons) both before and after exercise.  °· Run or exercise on even surfaces that are not hard. For example, asphalt is better than pavement.  °· Do not run barefoot on hard surfaces.  °· If using a treadmill, vary the incline.  °· Do not continue to workout if you have foot or joint problems. Seek professional help if they do not improve.  °HOME CARE INSTRUCTIONS  °· Avoid activities that cause you pain until you recover.  °· Use ice or cold packs to the problem or painful areas after working out.  °· Only take over-the-counter or prescription medicines for pain, discomfort, or fever as directed by your caregiver.  °· Soft shoe inserts or athletic shoes with air or gel sole cushions may be helpful.  °· If problems continue or become more severe, consult a sports medicine caregiver. Cortisone is a potent anti-inflammatory medication that may be injected into   the painful area. You can discuss this treatment with your caregiver.  MAKE SURE YOU:   Understand these instructions.   Will watch your condition.   Will get help right away if you are not doing well or get worse.  Document Released: 03/01/2005 Document Revised: 10/05/2010 Document Reviewed: 05/03/2005 [facility] Patient Information 2012 [place], [place].   Take over the counter extra strength tyelnol, as directed on packaging, with food, for the next week, then as needed for discomfort.  Wear well-supportive shoes, with soft  cushions to heels or insoles for the next several months. Apply moist heat or ice to the area(s) of discomfort, for 15 minutes at a time, several times per day for the next few days.  Do not fall asleep on a heating or ice pack.  Call your regular medical doctor tomorrow to schedule a follow up appointment this week.  Return to the Emergency Department immediately if worsening.

## 2011-03-21 NOTE — ED Notes (Signed)
Pt back in room from radiology.

## 2011-03-21 NOTE — ED Provider Notes (Signed)
History     CSN: [id number]  Arrival date & time 03/21/11  1710   First MD Initiated Contact with Patient 03/21/11 1745      Chief Complaint  Patient presents with  . Foot Pain    HPI Pt was seen at 1825.  Per pt, c/o gradual onset and persistence of constant left heel "pain" x1 week.  States he may have stepped on a pin approx 1 week ago but is unsure.  Denies rash, no fevers, no drainage.   Past Medical History  Diagnosis Date  . Myocardial infarct   . Hypertension   . High cholesterol   . Diabetes mellitus     Past Surgical History  Procedure Date  . Hernia repair      History  Substance Use Topics  . Smoking status: Former Games developer  . Smokeless tobacco: Not on file  . Alcohol Use: No      Review of Systems ROS: Statement: All systems negative except as marked or noted in the HPI; Constitutional: Negative for fever and chills. ; ; Eyes: Negative for eye pain, redness and discharge. ; ; ENMT: Negative for ear pain, hoarseness, nasal congestion, sinus pressure and sore throat. ; ; Cardiovascular: Negative for chest pain, palpitations, diaphoresis, dyspnea and peripheral edema. ; ; Respiratory: Negative for cough, wheezing and stridor. ; ; Gastrointestinal: Negative for nausea, vomiting, diarrhea, abdominal pain, blood in stool, hematemesis, jaundice and rectal bleeding. . ; ; Genitourinary: Negative for dysuria, flank pain and hematuria. ; ; Musculoskeletal: Negative for back pain and neck pain. Negative for swelling, +left heel pain; ; Skin: Negative for pruritus, rash, abrasions, blisters, bruising and skin lesion.; ; Neuro: Negative for headache, lightheadedness and neck stiffness. Negative for weakness, altered level of consciousness , altered mental status, extremity weakness, paresthesias, involuntary movement, seizure and syncope.      Allergies  Review of patient's allergies indicates no known allergies.  Home Medications  No current outpatient prescriptions on  file.  BP 133/81  Pulse 76  Temp(Src) 98 F (36.7 C) (Oral)  Resp 18  Ht 5\' 9"  (1.753 m)  Wt 301 lb (136.533 kg)  BMI 44.45 kg/m2  SpO2 98%  Physical Exam 1830: Physical examination:  Nursing notes reviewed; Vital signs and O2 SAT reviewed;  Constitutional: Well developed, Well nourished, Well hydrated, In no acute distress; Head:  Normocephalic, atraumatic; Eyes: EOMI, PERRL, No scleral icterus; ENMT: Mouth and pharynx normal, Mucous membranes moist; Neck: Supple, Full range of motion, No lymphadenopathy; Cardiovascular: Regular rate and rhythm, No murmur, rub, or gallop; Respiratory: Breath sounds clear & equal bilaterally, No rales, rhonchi, wheezes, or rub, Normal respiratory effort/excursion; Chest: Nontender, Movement normal; Abdomen: Soft, Nontender, Nondistended, Normal bowel sounds; Extremities: Pulses normal, No tenderness, No edema, No calf edema or asymmetry, +very small puncture wound to left heel without surrounding erythema, no ecchymosis, no drainage, no streaking.  +mild tenderness to palp left distal heel and arch of foot.; Neuro: AA&Ox3, Major CN grossly intact.  No gross focal motor or sensory deficits in extremities.; Skin: Color normal, Warm, Dry, no rash.    ED Course  Procedures  MDM  MDM Reviewed: nursing note and vitals Interpretation: x-ray     Dg Os Calcis Left 03/21/2011  *RADIOLOGY REPORT*  Clinical Data: Foot pain.  Rule out foreign body.  May have step on something a week ago.  LEFT OS CALCIS - 2+ VIEW  Comparison: None.  Findings: There are small posterior and plantar are calcaneal heel spurs.  No fracture or subluxation.  No radio-opaque foreign bodies or soft tissue calcifications.  IMPRESSION:  1.  No acute findings. 2.  Heel spurs.  Original Report Authenticated By: Rosealee Albee, M.D.    Dg Foot Complete Left 03/21/2011  *RADIOLOGY REPORT*  Clinical Data: Foot pain.  Evaluate for foreign body  LEFT FOOT - COMPLETE 3+ VIEW  Comparison: None   Findings: There are small plantar are and posterior calcaneal heel spurs.  There is no acute fracture or subluxation identified.  No radio-opaque foreign bodies or soft tissue calcifications.  IMPRESSION:  1.  No acute findings. 2.  Heel spurs.  Original Report Authenticated By: Rosealee Albee, M.D.     6:58 PM:  No FB, no signs of infection.  Td updated.  Same XR found in [facility] from yesterday.  Pt and family questioned regarding this.  Now endorse they went to their PMD yesterday for same, had XR, told he had heel spurs, no FB and was referred to a Podiatrist.  Apparently he came to the ED for another opinion.  Dx testing d/w pt and family.  Questions answered.  Verb understanding, agreeable to d/c home with outpt f/u.      Laray Anger, DO 03/23/11 [phone]

## 2011-03-21 NOTE — ED Notes (Signed)
C/o pain to left heel x 1 wk.  States he thinks he stepped on a pin needle and tip of needle broke off into heel.

## 2012-02-04 ENCOUNTER — Emergency Department (HOSPITAL_COMMUNITY)
Admission: EM | Admit: 2012-02-04 | Discharge: 2012-02-04 | Disposition: A | Discharge status: Home / Self Care | Payer: Medicare Other | Attending: Emergency Medicine | Admitting: Emergency Medicine

## 2012-02-04 ENCOUNTER — Encounter (HOSPITAL_COMMUNITY): Payer: Self-pay | Admitting: *Deleted

## 2012-02-04 DIAGNOSIS — M79609 Pain in unspecified limb: Secondary | ICD-10-CM | POA: Insufficient documentation

## 2012-02-04 DIAGNOSIS — I252 Old myocardial infarction: Secondary | ICD-10-CM | POA: Insufficient documentation

## 2012-02-04 DIAGNOSIS — Z87891 Personal history of nicotine dependence: Secondary | ICD-10-CM | POA: Insufficient documentation

## 2012-02-04 DIAGNOSIS — M79643 Pain in unspecified hand: Secondary | ICD-10-CM

## 2012-02-04 DIAGNOSIS — E7800 Pure hypercholesterolemia, unspecified: Secondary | ICD-10-CM | POA: Insufficient documentation

## 2012-02-04 DIAGNOSIS — Z7901 Long term (current) use of anticoagulants: Secondary | ICD-10-CM | POA: Insufficient documentation

## 2012-02-04 DIAGNOSIS — I10 Essential (primary) hypertension: Secondary | ICD-10-CM | POA: Insufficient documentation

## 2012-02-04 DIAGNOSIS — E119 Type 2 diabetes mellitus without complications: Secondary | ICD-10-CM | POA: Insufficient documentation

## 2012-02-04 DIAGNOSIS — Z79899 Other long term (current) drug therapy: Secondary | ICD-10-CM | POA: Insufficient documentation

## 2012-02-04 LAB — CBC WITH DIFFERENTIAL/PLATELET
Eosinophils Absolute: 0.1 10*3/uL (ref 0.0–0.7)
Eosinophils Relative: 1 % (ref 0–5)
Hemoglobin: 12.9 g/dL — ABNORMAL LOW (ref 13.0–17.0)
Lymphs Abs: 1.3 10*3/uL (ref 0.7–4.0)
MCH: 30.1 pg (ref 26.0–34.0)
MCV: 89.5 fL (ref 78.0–100.0)
Monocytes Absolute: 0.5 10*3/uL (ref 0.1–1.0)
Monocytes Relative: 6 % (ref 3–12)
RBC: 4.28 MIL/uL (ref 4.22–5.81)

## 2012-02-04 LAB — BASIC METABOLIC PANEL
BUN: 16 mg/dL (ref 6–23)
Calcium: 9 mg/dL (ref 8.4–10.5)
GFR calc non Af Amer: 62 mL/min — ABNORMAL LOW (ref >90)
Glucose, Bld: 181 mg/dL — ABNORMAL HIGH (ref 70–99)

## 2012-02-04 MED ORDER — HYDROMORPHONE HCL 4 MG PO TABS: 4.0000 mg | ORAL_TABLET | ORAL | Status: DC | PRN

## 2012-02-04 MED ORDER — FENTANYL CITRATE 0.05 MG/ML IJ SOLN
50.0000 ug | Freq: Once | INTRAMUSCULAR | Status: AC
  Administered 2012-02-04: 50 ug via INTRAVENOUS
  Filled 2012-02-04: qty 2

## 2012-02-04 MED ORDER — FENTANYL CITRATE 0.05 MG/ML IJ SOLN
100.0000 ug | Freq: Once | INTRAMUSCULAR | Status: AC
  Administered 2012-02-04: 100 ug via INTRAVENOUS
  Filled 2012-02-04: qty 2

## 2012-02-04 MED ORDER — ONDANSETRON HCL 4 MG/2ML IJ SOLN
4.0000 mg | Freq: Once | INTRAMUSCULAR | Status: AC
  Administered 2012-02-04: 4 mg via INTRAVENOUS
  Filled 2012-02-04: qty 2

## 2012-02-04 MED ORDER — PREDNISONE 20 MG PO TABS: ORAL_TABLET | ORAL | Status: DC

## 2012-02-04 MED ORDER — HYDROMORPHONE HCL PF 1 MG/ML IJ SOLN
1.0000 mg | Freq: Once | INTRAMUSCULAR | Status: AC
  Administered 2012-02-04: 1 mg via INTRAVENOUS
  Filled 2012-02-04: qty 1

## 2012-02-04 NOTE — ED Notes (Signed)
Patient has been using a salonpas  patch

## 2012-02-04 NOTE — ED Notes (Signed)
Pt states that his pain is worse again, Dr. Fonnie Jarvis notified,

## 2012-02-04 NOTE — ED Provider Notes (Signed)
History     CSN: [id number]  Arrival date & time 02/04/12  1478   First MD Initiated Contact with Patient 02/04/12 [phone]      Chief Complaint  Patient presents with  . Hand Pain    no injury noted    (Consider location/radiation/quality/duration/timing/severity/associated sxs/prior treatment) HPI This 66 year old male has a five-day history gradual onset constant bilateral hand pain left hand worse than the right with no weakness no numbness no color change and minimal swelling and no trauma. He has not had pain in his hands like this before. His treatment prior to arrival consists of his hydrocodone that he takes for his chronic low back pain and has not helped his pain in his hands. He is no fever chest pain palpitations cough shortness breath abdominal pain vomiting or other concerns. His pain is severe. His pain is worse with palpation or movement and better if he stays still. Past Medical History  Diagnosis Date  . Myocardial infarct   . Hypertension   . High cholesterol   . Diabetes mellitus     Past Surgical History  Procedure Date  . Hernia repair     History reviewed. No pertinent family history.  History  Substance Use Topics  . Smoking status: Former Games developer  . Smokeless tobacco: Not on file  . Alcohol Use: No      Review of Systems 10 Systems reviewed and are negative for acute change except as noted in the HPI. Allergies  Review of patient's allergies indicates no known allergies.  Home Medications   Current Outpatient Rx  Name  Route  Sig  Dispense  Refill  . ALPRAZOLAM 1 MG PO TABS   Oral   Take 1 mg by mouth 4 (four) times daily.         . ASPIRIN 325 MG PO TABS   Oral   Take 325 mg by mouth daily.         . ATORVASTATIN CALCIUM 20 MG PO TABS   Oral   Take 20 mg by mouth daily.         Marland Kitchen CLOPIDOGREL BISULFATE 75 MG PO TABS   Oral   Take 75 mg by mouth daily.         . GLYBURIDE-METFORMIN 5-500 MG PO TABS   Oral   Take 1  tablet by mouth 2 (two) times daily with a meal.         . HYDROCODONE-ACETAMINOPHEN 10-500 MG PO TABS   Oral   Take 1 tablet by mouth every 6 (six) hours as needed.         Marland Kitchen METOPROLOL TARTRATE 25 MG PO TABS   Oral   Take 12.5 mg by mouth 2 (two) times daily.         Marland Kitchen NIACIN 500 MG PO TABS   Oral   Take 500 mg by mouth daily with breakfast.         . TERAZOSIN HCL 5 MG PO CAPS   Oral   Take 5 mg by mouth at bedtime.         Marland Kitchen HYDROMORPHONE HCL 4 MG PO TABS   Oral   Take 1 tablet (4 mg total) by mouth every 4 (four) hours as needed for pain.   10 tablet   0   . PREDNISONE 20 MG PO TABS      3 tabs po daily x 2 days   6 tablet   0     BP  122/54  Pulse 83  Temp 99.7 F (37.6 C) (Oral)  Resp 22  Ht 5' 8.5" (1.74 m)  Wt 301 lb (136.533 kg)  BMI 45.10 kg/m2  SpO2 93%  Physical Exam  Nursing note and vitals reviewed. Constitutional:       Awake, alert, nontoxic appearance.  HENT:  Head: Atraumatic.  Eyes: Right eye exhibits no discharge. Left eye exhibits no discharge.  Neck: Neck supple.  Cardiovascular: Normal rate and regular rhythm.   No murmur heard. Pulmonary/Chest: Effort normal and breath sounds normal. No respiratory distress. He has no wheezes. He has no rales. He exhibits no tenderness.  Abdominal: Soft. There is no tenderness. There is no rebound.  Musculoskeletal: He exhibits no edema and no tenderness.       Baseline ROM except somewhat limited and bilateral hands due to pain, no obvious new focal weakness. Bilateral upper arms forearms are nontender. Bilateral hands have capillary refill less than 2 seconds in all digits, normal light touch, decreased range of motion due to pain with diffuse tenderness to both hands and all joints with minimal tenderness to the wrist diffusely with intact light touch and strength in the distributions of the median, radial, and ulnar nerve function in both hands. The skin to both hands is intact without  erythema to both hands feel a bit warmer than his forearms. There is no specific swelling or tenderness to any one specific joint rather he is a diffuse apparent polyarthritis in both hands.  Neurological: He is alert.       Mental status and motor strength appears baseline for patient and situation.  Skin: No rash noted.  Psychiatric: He has a normal mood and affect.    ED Course  Procedures (including critical care time)  Labs Reviewed  CBC WITH DIFFERENTIAL - Abnormal; Notable for the following:    Hemoglobin 12.9 (*)     HCT 38.3 (*)     Neutrophils Relative 80 (*)     All other components within normal limits  BASIC METABOLIC PANEL - Abnormal; Notable for the following:    Glucose, Bld 181 (*)     GFR calc non Af Amer 62 (*)     GFR calc Af Amer 72 (*)     All other components within normal limits  SEDIMENTATION RATE - Abnormal; Notable for the following:    Sed Rate 35 (*)     All other components within normal limits  RHEUMATOID FACTOR  ANA   No results found.   1. Hand pain       MDM  Patient / Family / Caregiver informed of clinical course, understand medical decision-making process, and agree with plan.I doubt any other EMC precluding discharge at this time including, but not necessarily limited to the following:septic arthritis.        Hurman Horn, MD 02/04/12 [phone]

## 2012-02-04 NOTE — ED Notes (Signed)
Dr. Bednar at bedside. 

## 2012-02-05 ENCOUNTER — Ambulatory Visit (HOSPITAL_COMMUNITY)
Admission: RE | Admit: 2012-02-05 | Discharge: 2012-02-05 | Disposition: A | Discharge status: Home / Self Care | Payer: Medicare Other | Source: Ambulatory Visit | Attending: Pulmonary Disease | Admitting: Pulmonary Disease

## 2012-02-05 ENCOUNTER — Other Ambulatory Visit (HOSPITAL_COMMUNITY): Payer: Self-pay | Admitting: Pulmonary Disease

## 2012-02-05 DIAGNOSIS — R609 Edema, unspecified: Secondary | ICD-10-CM

## 2012-02-05 DIAGNOSIS — M7989 Other specified soft tissue disorders: Secondary | ICD-10-CM | POA: Insufficient documentation

## 2012-02-05 DIAGNOSIS — M79609 Pain in unspecified limb: Secondary | ICD-10-CM | POA: Insufficient documentation

## 2012-02-06 LAB — ANTI-NUCLEAR AB-TITER (ANA TITER): ANA Titer 1: NEGATIVE

## 2012-02-06 LAB — ANA: Anti Nuclear Antibody(ANA): POSITIVE — AB

## 2012-05-31 ENCOUNTER — Encounter: Payer: Self-pay | Admitting: *Deleted

## 2012-05-31 DIAGNOSIS — Z9861 Coronary angioplasty status: Secondary | ICD-10-CM | POA: Insufficient documentation

## 2012-05-31 DIAGNOSIS — E785 Hyperlipidemia, unspecified: Secondary | ICD-10-CM | POA: Insufficient documentation

## 2012-05-31 DIAGNOSIS — I2510 Atherosclerotic heart disease of native coronary artery without angina pectoris: Secondary | ICD-10-CM | POA: Insufficient documentation

## 2012-06-03 ENCOUNTER — Ambulatory Visit (INDEPENDENT_AMBULATORY_CARE_PROVIDER_SITE_OTHER): Payer: Medicare Other | Admitting: Internal Medicine

## 2012-06-03 ENCOUNTER — Encounter: Payer: Self-pay | Admitting: *Deleted

## 2012-06-03 ENCOUNTER — Encounter: Payer: Self-pay | Admitting: Internal Medicine

## 2012-06-03 VITALS — BP 140/85 | Wt 314.0 lb

## 2012-06-03 DIAGNOSIS — E785 Hyperlipidemia, unspecified: Secondary | ICD-10-CM

## 2012-06-03 DIAGNOSIS — I2510 Atherosclerotic heart disease of native coronary artery without angina pectoris: Secondary | ICD-10-CM

## 2012-06-03 DIAGNOSIS — I10 Essential (primary) hypertension: Secondary | ICD-10-CM

## 2012-06-03 MED ORDER — EZETIMIBE 10 MG PO TABS: 10.0000 mg | ORAL_TABLET | Freq: Every day | ORAL | Status: DC

## 2012-06-03 MED ORDER — LISINOPRIL 2.5 MG PO TABS: 2.5000 mg | ORAL_TABLET | Freq: Every day | ORAL | Status: DC

## 2012-06-03 NOTE — Patient Instructions (Addendum)
Your physician has recommended you make the following change in your medication: START Zetia 10mg  take one by mouth daily, START Lisinopril 2.5mg  take one by mouth daily  Your physician recommends that you return for a FASTING Lipid profile in 8 WEEKS--nothing to eat or drink after midnight (order in system)  Your physician recommends that you return for lab work in: 10 days (BMP--order given to patient)  Your physician has requested that you have a lexiscan myoview. For further information please visit [email or web address] Please follow instruction sheet, as given.  Your physician wants you to follow-up in: 6 MONTHS with Dr Tenny Craw.  You will receive a reminder letter in the mail two months in advance. If you don't receive a letter, please call our office to schedule the follow-up appointment.

## 2012-06-03 NOTE — Progress Notes (Signed)
HPI Patient is a 67 yo who was previously seen by Ival Bible  In 2009. He has a histroy of CAD, s/p ACS in 2005.  Cath showed LVEF of 55% with inferobasal hypokinesis. RCA was 100% occluded in mid/distal region.  There was severe dz in PDA.  L to R collaterals in this area. Patient underwent PTCA/ DES to the RCA.  The LAD had a chronic occlusion after second diagonal with collateralziation. THe patient underwent a myoview scan in 2009 that showed inferior and inferoapical defect consistent with scar and ischemia The patient is usually followed in medicine clinic He has noticed over the past few months increased chest tightness with walking hills.  None at rest.   Is very busy helping wife who is an invalid.  Has no CP with this.  No Known Allergies  Current Outpatient Prescriptions  Medication Sig Dispense Refill  . ALPRAZolam (XANAX) 1 MG tablet Take 1 mg by mouth 4 (four) times daily.      Marland Kitchen aspirin 325 MG tablet Take 325 mg by mouth daily.      Marland Kitchen atorvastatin (LIPITOR) 20 MG tablet Take 20 mg by mouth daily.      . clopidogrel (PLAVIX) 75 MG tablet Take 75 mg by mouth daily.      Marland Kitchen glyBURIDE-metformin (GLUCOVANCE) 5-500 MG per tablet Take 1 tablet by mouth 2 (two) times daily with a meal.      . HYDROcodone-acetaminophen (LORTAB) 10-500 MG per tablet Take 1 tablet by mouth every 6 (six) hours as needed.      . metoprolol tartrate (LOPRESSOR) 25 MG tablet Take 12.5 mg by mouth 2 (two) times daily.      . niacin 500 MG tablet Take 500 mg by mouth daily with breakfast.      . nitroGLYCERIN (NITROSTAT) 0.4 MG SL tablet Place 0.4 mg under the tongue every 5 (five) minutes as needed for chest pain.      Marland Kitchen terazosin (HYTRIN) 5 MG capsule Take 5 mg by mouth at bedtime.       No current facility-administered medications for this visit.    Past Medical History  Diagnosis Date  . Myocardial infarct   . Hypertension   . High cholesterol   . Diabetes mellitus     Past Surgical History   Procedure Laterality Date  . Hernia repair    . Coronary angioplasty with stent placement    . Back surgery    . Colonoscopy      No family history on file.  History   Social History  . Marital Status: Married    Spouse Name: N/A    Number of Children: N/A  . Years of Education: N/A   Occupational History  . Not on file.   Social History Main Topics  . Smoking status: Former Games developer  . Smokeless tobacco: Not on file  . Alcohol Use: No  . Drug Use: No  . Sexually Active: Not on file   Other Topics Concern  . Not on file   Social History Narrative  . No narrative on file    Review of Systems:  All systems reviewed.  They are negative to the above problem except as previously stated.  Vital Signs: BP 140/85  Wt 314 lb (142.429 kg)  BMI 47.04 kg/m2  Physical Exam Patient is a morbidly obese 67 yo in NAD HEENT:  Normocephalic, atraumatic. EOMI, PERRLA.  Neck: JVP is normal.  No bruits.  Lungs: clear to auscultation. No rales  no wheezes.  Heart: Regular rate and rhythm. Normal S1, S2. No S3.   No significant murmurs. PMI not displaced.  Abdomen:  Supple, nontender. Normal bowel sounds. No masses. No hepatomegaly.  Extremities:   Good distal pulses throughout. No lower extremity edema.  Musculoskeletal :moving all extremities.  Neuro:   alert and oriented x3.  CN II-XII grossly intact.  EKG  SR 71.  rSR' V1.  Possible inf MI Assessment and Plan:  1.  CAD  Significant CAD history  Complains of SOB and chest tightness with some activities but not others.   I would recomm a lexiscan myoview to r/o ischemia  2.  DM  COunselled on diet  3.  HL  Lipids are too high for extent of dz  Will add Zetia to lipitor  Recheck lipids with AST in 8 wks.    4.  HTN  Will add low dose ACE I to regiemn  2.5 mg Lisinopril. Follow up BMET in 10 days.  Tentative f/u in 6 months.

## 2012-06-13 ENCOUNTER — Other Ambulatory Visit: Payer: Self-pay | Admitting: Adult Health

## 2012-06-13 ENCOUNTER — Encounter (HOSPITAL_COMMUNITY): Payer: Self-pay

## 2012-06-13 ENCOUNTER — Encounter (HOSPITAL_COMMUNITY)
Admission: RE | Admit: 2012-06-13 | Discharge: 2012-06-13 | Disposition: A | Discharge status: Home / Self Care | Payer: Medicare Other | Source: Ambulatory Visit | Attending: Internal Medicine | Admitting: Internal Medicine

## 2012-06-13 ENCOUNTER — Ambulatory Visit (HOSPITAL_COMMUNITY)
Admission: RE | Admit: 2012-06-13 | Discharge: 2012-06-13 | Disposition: A | Discharge status: Home / Self Care | Payer: Medicare Other | Source: Ambulatory Visit | Attending: Internal Medicine | Admitting: Internal Medicine

## 2012-06-13 DIAGNOSIS — I2510 Atherosclerotic heart disease of native coronary artery without angina pectoris: Secondary | ICD-10-CM | POA: Insufficient documentation

## 2012-06-13 DIAGNOSIS — I209 Angina pectoris, unspecified: Secondary | ICD-10-CM | POA: Insufficient documentation

## 2012-06-13 DIAGNOSIS — I10 Essential (primary) hypertension: Secondary | ICD-10-CM | POA: Insufficient documentation

## 2012-06-13 DIAGNOSIS — R079 Chest pain, unspecified: Secondary | ICD-10-CM | POA: Insufficient documentation

## 2012-06-13 DIAGNOSIS — E785 Hyperlipidemia, unspecified: Secondary | ICD-10-CM

## 2012-06-13 MED ORDER — REGADENOSON 0.4 MG/5ML IV SOLN
INTRAVENOUS | Status: AC
  Filled 2012-06-13: qty 5

## 2012-06-13 MED ORDER — TECHNETIUM TC 99M SESTAMIBI - CARDIOLITE
30.0000 | Freq: Once | INTRAVENOUS | Status: AC | PRN
  Administered 2012-06-13: 11:00:00 30 via INTRAVENOUS

## 2012-06-13 MED ORDER — SODIUM CHLORIDE 0.9 % IJ SOLN
INTRAMUSCULAR | Status: AC
  Administered 2012-06-13: 10 mL via INTRAVENOUS
  Filled 2012-06-13: qty 10

## 2012-06-13 NOTE — Progress Notes (Signed)
Stress Lab Nurses Notes - Steven Stevens  Steven Stevens 06/13/2012 Reason for doing test: CAD and Chest Pain Type of test: Stress Cardiolite Nurse performing test: Parke Poisson, RN Nuclear Medicine Tech: Lyndel Pleasure Echo Tech: Not Applicable MD performing test: R. Rothbart Family MD: Dr. Juanetta Gosling Test explained and consent signed: yes IV started: 22g jelco, Saline lock flushed, No redness or edema and Saline lock started in radiology Symptoms:  Chest tightness & SOB Treatment/Intervention: None Reason test stopped: fatigue After recovery IV was: Discontinued via X-ray tech and No redness or edema Patient to return to Nuc. Med at : 12:00 Patient discharged: Home Patient's Condition upon discharge was: stable Comments: During test peak BP 146/84 & HR 150.  Recovery BP 144/76 & HR 82.. Symptoms resolved in recovery. [name]

## 2012-06-14 ENCOUNTER — Encounter (HOSPITAL_COMMUNITY)
Admission: RE | Admit: 2012-06-14 | Discharge: 2012-06-14 | Disposition: A | Discharge status: Home / Self Care | Payer: Medicare Other | Source: Ambulatory Visit | Attending: Internal Medicine | Admitting: Internal Medicine

## 2012-06-14 MED ORDER — TECHNETIUM TC 99M SESTAMIBI - CARDIOLITE
25.0000 | Freq: Once | INTRAVENOUS | Status: AC | PRN
  Administered 2012-06-14: 25 via INTRAVENOUS

## 2012-06-19 LAB — BASIC METABOLIC PANEL
CO2: 31 mEq/L (ref 19–32)
Calcium: 9.2 mg/dL (ref 8.4–10.5)
Creat: 1.2 mg/dL (ref 0.50–1.35)

## 2012-08-28 ENCOUNTER — Other Ambulatory Visit: Payer: Self-pay | Admitting: *Deleted

## 2012-08-28 ENCOUNTER — Encounter: Payer: Self-pay | Admitting: *Deleted

## 2012-08-28 DIAGNOSIS — E782 Mixed hyperlipidemia: Secondary | ICD-10-CM

## 2012-08-30 ENCOUNTER — Telehealth: Payer: Self-pay | Admitting: *Deleted

## 2012-08-30 NOTE — Telephone Encounter (Signed)
[facility] pharmacist called because pt came to peak up a prescription for Lozartan/hct 50-12.5 mg one tablet by mouth daily. The pharmacist wants to know if Dr. Tenny Craw wants for pt to take this medication and Lisinopril 2.5 mg. Pt has been taking these medications for the  last 4 to 5 months. On pt's last office visit on 4/28 /14 pt was taking the Losartan/HCT 50-12.5 mg and his BP then was 140/85. [facility] Pharmacist will refill the Losartan/hct prescription, and wants for the office to call pt with update.

## 2012-09-03 NOTE — Telephone Encounter (Signed)
Keep on the same regimen. WIll review when I see her in clinic next.

## 2012-09-04 NOTE — Telephone Encounter (Signed)
Shannon notified.  °

## 2012-10-22 ENCOUNTER — Other Ambulatory Visit: Payer: Self-pay | Admitting: Internal Medicine

## 2012-10-22 LAB — LIPID PANEL
Cholesterol: 136 mg/dL (ref 0–200)
HDL: 38 mg/dL — ABNORMAL LOW (ref >39)
Triglycerides: 170 mg/dL — ABNORMAL HIGH (ref <150)

## 2012-10-30 ENCOUNTER — Other Ambulatory Visit: Payer: Self-pay | Admitting: *Deleted

## 2012-10-30 DIAGNOSIS — E782 Mixed hyperlipidemia: Secondary | ICD-10-CM

## 2012-10-30 DIAGNOSIS — I10 Essential (primary) hypertension: Secondary | ICD-10-CM

## 2012-11-11 ENCOUNTER — Encounter: Payer: Self-pay | Admitting: Internal Medicine

## 2012-11-11 ENCOUNTER — Ambulatory Visit (INDEPENDENT_AMBULATORY_CARE_PROVIDER_SITE_OTHER): Payer: Medicare Other | Admitting: Internal Medicine

## 2012-11-11 ENCOUNTER — Ambulatory Visit: Payer: Medicare Other | Admitting: Internal Medicine

## 2012-11-11 VITALS — BP 148/92 | HR 70 | Ht 68.0 in | Wt 318.0 lb

## 2012-11-11 DIAGNOSIS — E782 Mixed hyperlipidemia: Secondary | ICD-10-CM

## 2012-11-11 DIAGNOSIS — I10 Essential (primary) hypertension: Secondary | ICD-10-CM

## 2012-11-11 MED ORDER — LOSARTAN POTASSIUM-HCTZ 100-12.5 MG PO TABS: 1.0000 | ORAL_TABLET | Freq: Every day | ORAL | Status: DC

## 2012-11-11 MED ORDER — ATORVASTATIN CALCIUM 40 MG PO TABS: 40.0000 mg | ORAL_TABLET | Freq: Every day | ORAL | Status: DC

## 2012-11-11 MED ORDER — NITROGLYCERIN 0.4 MG SL SUBL: 0.4000 mg | SUBLINGUAL_TABLET | SUBLINGUAL | Status: DC | PRN

## 2012-11-11 NOTE — Progress Notes (Signed)
HPI Patient is a 67 yo who was previously seen by Steven Stevens  In 2009. He has a histroy of CAD, s/p ACS in 2005.  Cath showed LVEF of 55% with inferobasal hypokinesis. RCA was 100% occluded in mid/distal region.  There was severe dz in PDA.  L to R collaterals in this area. Patient underwent PTCA/ DES to the RCA.  The LAD had a chronic occlusion after second diagonal with collateralziation. THe patient underwent a myoview scan in 2009 that showed inferior and inferoapical defect consistent with scar and ischemia  He was last in cardiology clinic in the spring  He complained of some chest tightness with walking not with other activities  Nuclear stress test done  It showed inferior scar with mild periinfarct ischemia.  Zetia was added to medical regimen  Lipid in September were excellent with LDL of 64.   [name] called  Recomm stop and increase Lipitor to 40  F/U lipids in 8 wks.   SInce seen he has done well Breathing is OK  NO CP  Did not walk much in summer due to heat.   No Known Allergies  Current Outpatient Prescriptions  Medication Sig Dispense Refill  . ALPRAZolam (XANAX) 1 MG tablet Take 1 mg by mouth 4 (four) times daily.      Marland Kitchen aspirin 325 MG tablet Take 325 mg by mouth daily.      Marland Kitchen atorvastatin (LIPITOR) 20 MG tablet Take 20 mg by mouth daily.      . clopidogrel (PLAVIX) 75 MG tablet Take 75 mg by mouth daily.      Marland Kitchen glyBURIDE-metformin (GLUCOVANCE) 5-500 MG per tablet Take 1 tablet by mouth 2 (two) times daily with a meal.      . HYDROcodone-acetaminophen (LORTAB) 10-500 MG per tablet Take 1 tablet by mouth every 6 (six) hours as needed.      Marland Kitchen lisinopril (PRINIVIL,ZESTRIL) 2.5 MG tablet Take 1 tablet (2.5 mg total) by mouth daily.  90 tablet  3  . losartan-hydrochlorothiazide (HYZAAR) 50-12.5 MG per tablet       . meclizine (ANTIVERT) 25 MG tablet       . metoprolol tartrate (LOPRESSOR) 25 MG tablet Take 12.5 mg by mouth 2 (two) times daily.      . niacin 500 MG tablet Take  500 mg by mouth daily with breakfast.      . nitroGLYCERIN (NITROSTAT) 0.4 MG SL tablet Place 1 tablet (0.4 mg total) under the tongue every 5 (five) minutes as needed for chest pain.  25 tablet  4  . ONE TOUCH ULTRA TEST test strip       . terazosin (HYTRIN) 5 MG capsule Take 5 mg by mouth at bedtime.       No current facility-administered medications for this visit.    Past Medical History  Diagnosis Date  . Myocardial infarct   . Hypertension   . High cholesterol   . Diabetes mellitus     Past Surgical History  Procedure Laterality Date  . Hernia repair    . Coronary angioplasty with stent placement    . Back surgery    . Colonoscopy      No family history on file.  History   Social History  . Marital Status: Married    Spouse Name: N/A    Number of Children: N/A  . Years of Education: N/A   Occupational History  . Not on file.   Social History Main Topics  . Smoking status:  Former Smoker  . Smokeless tobacco: Not on file  . Alcohol Use: No  . Drug Use: No  . Sexual Activity: Not on file   Other Topics Concern  . Not on file   Social History Narrative  . No narrative on file    Review of Systems:  All systems reviewed.  They are negative to the above problem except as previously stated.  Vital Signs: BP 148/92  Pulse 70  Ht 5\' 8"  (1.727 m)  Wt 318 lb (144.244 kg)  BMI 48.36 kg/m2  Physical Exam Patient is a morbidly obese 67 yo in NAD HEENT:  Normocephalic, atraumatic. EOMI, PERRLA.  Neck: JVP is normal.  No bruits.  Lungs: clear to auscultation. No rales no wheezes.  Heart: Regular rate and rhythm. Normal S1, S2. No S3.   No significant murmurs. PMI not displaced.  Abdomen:  Supple, nontender. Normal bowel sounds. No masses. No hepatomegaly.  Extremities:   Good distal pulses throughout. No lower extremity edema.  Musculoskeletal :moving all extremities.  Neuro:   alert and oriented x3.  CN II-XII grossly intact.  EKG  SR 71.  rSR' V1.   Possible inf MI Assessment and Plan:  1.  CAD  Significant CAD history   Recent myoview unchanged. Asymptomatic  Continue durrent regimen  2.  DM  COunselled on diet  Glu running good.   3.  HL  Lipids were excellent  Recomm to try Lipitor 40 instead of Zetia to simplify  Will start  Get lipids in December.  4.  HTN  BP on my check 120/85  WIll d/c lisinopril  Increase hyzaar to 100/12.5 5.  Obesity  Counselled on wt loss  Patient has lost up t o40 lbs in past  Will refer to dietary Needs to use silver sneakers   Tentative f/u in 6 months.

## 2012-11-11 NOTE — Patient Instructions (Addendum)
Your physician recommends that you schedule a follow-up appointment in: 6 MONTHS  Your physician has recommended you make the following change in your medication:   1) FINISH YOUR CURRENT PRESCRIPTION FOR LISINOPRIL 2.5MG , THEN STOP  2) INCREASE YOUR HYZAAR TO 100/12.5MG  ONCE DAILY 3) PICK UP YOUR INCREASE IN LIPITOR 40MG  ONCE DAILY, ALREADY CALLED INTO YOUR PHARMACY   Your physician recommends THAT YOU HAVE AN APPOINTMENT SCHEDULED WITH OUR NUTIRONIST TO DISCUSS WEIGHT LOSS, A staff member from our office will alert you the with appointment date and time, once available

## 2012-11-13 ENCOUNTER — Telehealth (HOSPITAL_COMMUNITY): Payer: Self-pay | Admitting: Dietician

## 2012-11-13 NOTE — Telephone Encounter (Signed)
Called and left message at [phone].

## 2012-11-13 NOTE — Telephone Encounter (Signed)
Received referral via fax from [facility] (Dr. Tenny Craw) for dx: obesity.

## 2012-11-20 NOTE — Telephone Encounter (Signed)
No response to previous contact attempt. Sent letter to pt home via US Mail in attempt to contact pt to schedule appointment.  

## 2012-11-25 ENCOUNTER — Encounter (HOSPITAL_COMMUNITY): Payer: Self-pay

## 2012-11-25 NOTE — Telephone Encounter (Signed)
Pt has not responded to attempts to contact to schedule appointment. Referral filed.  

## 2012-11-26 ENCOUNTER — Encounter (HOSPITAL_COMMUNITY)
Admission: RE | Admit: 2012-11-26 | Discharge: 2012-11-26 | Disposition: A | Discharge status: Home / Self Care | Payer: Medicare Other | Source: Ambulatory Visit | Attending: Ophthalmology | Admitting: Ophthalmology

## 2012-11-26 ENCOUNTER — Encounter (HOSPITAL_COMMUNITY): Payer: Self-pay

## 2012-11-26 DIAGNOSIS — Z01812 Encounter for preprocedural laboratory examination: Secondary | ICD-10-CM | POA: Insufficient documentation

## 2012-11-26 LAB — CBC
HCT: 38.6 % — ABNORMAL LOW (ref 39.0–52.0)
MCH: 30.1 pg (ref 26.0–34.0)
MCV: 89.4 fL (ref 78.0–100.0)
RDW: 13.8 % (ref 11.5–15.5)
WBC: 6.6 10*3/uL (ref 4.0–10.5)

## 2012-11-26 LAB — BASIC METABOLIC PANEL
BUN: 17 mg/dL (ref 6–23)
Chloride: 99 mEq/L (ref 96–112)
Creatinine, Ser: 1.27 mg/dL (ref 0.50–1.35)
GFR calc Af Amer: 66 mL/min — ABNORMAL LOW (ref >90)

## 2012-11-26 NOTE — Progress Notes (Signed)
11/26/12 0829  OBSTRUCTIVE SLEEP APNEA  Have you ever been diagnosed with sleep apnea through a sleep study? No  Do you snore loudly (loud enough to be heard through closed doors)?  1  Do you often feel tired, fatigued, or sleepy during the daytime? 0  Has anyone observed you stop breathing during your sleep? 0  Do you have, or are you being treated for high blood pressure? 1  BMI more than 35 kg/m2? 1  Age over 67 years old? 1  Neck circumference greater than 40 cm/18 inches? 1  Gender: 1  Obstructive Sleep Apnea Score 6

## 2012-11-26 NOTE — Patient Instructions (Signed)
Liliana Brentlinger Somes  11/26/2012   Your procedure is scheduled on:  12/03/2012  Report to [facility] at 9:30 AM.  Call this number if you have problems the morning of surgery: [phone]   Remember:   Do not eat food or drink liquids after midnight.   Take these medicines the morning of surgery with A SIP OF WATER: Lisinopril, Lopressor and Hyzaar  Do not wear jewelry, make-up or nail polish.  Do not wear lotions, powders, or perfumes. You may wear deodorant.  Do not shave 48 hours prior to surgery. Men may shave face and neck.  Do not bring valuables to the hospital.  [facility] is not responsible                  for any belongings or valuables.               Contacts, dentures or bridgework may not be worn into surgery.  Leave suitcase in the car. After surgery it may be brought to your room.  For patients admitted to the hospital, discharge time is determined by your                treatment team.               Patients discharged the day of surgery will not be allowed to drive  home.  Name and phone number of your driver:  Special Instructions: N/A   Please read over the following fact sheets that you were given: Care and Recovery After Surgery

## 2012-12-03 ENCOUNTER — Ambulatory Visit (HOSPITAL_COMMUNITY)
Admission: RE | Admit: 2012-12-03 | Discharge: 2012-12-03 | Disposition: A | Discharge status: Home / Self Care | Payer: Medicare Other | Source: Ambulatory Visit | Attending: Ophthalmology | Admitting: Ophthalmology

## 2012-12-03 ENCOUNTER — Encounter (HOSPITAL_COMMUNITY): Payer: Medicare Other | Admitting: Anesthesiology

## 2012-12-03 ENCOUNTER — Ambulatory Visit (HOSPITAL_COMMUNITY): Payer: Medicare Other | Admitting: Anesthesiology

## 2012-12-03 ENCOUNTER — Encounter (HOSPITAL_COMMUNITY): Payer: Self-pay

## 2012-12-03 ENCOUNTER — Encounter (HOSPITAL_COMMUNITY)
Admission: RE | Disposition: A | Discharge status: Home / Self Care | Payer: Self-pay | Source: Ambulatory Visit | Attending: Ophthalmology

## 2012-12-03 DIAGNOSIS — H2510 Age-related nuclear cataract, unspecified eye: Secondary | ICD-10-CM | POA: Insufficient documentation

## 2012-12-03 DIAGNOSIS — Z01812 Encounter for preprocedural laboratory examination: Secondary | ICD-10-CM | POA: Insufficient documentation

## 2012-12-03 DIAGNOSIS — E119 Type 2 diabetes mellitus without complications: Secondary | ICD-10-CM | POA: Insufficient documentation

## 2012-12-03 DIAGNOSIS — I10 Essential (primary) hypertension: Secondary | ICD-10-CM | POA: Insufficient documentation

## 2012-12-03 HISTORY — PX: CATARACT EXTRACTION W/PHACO: SHX586

## 2012-12-03 LAB — GLUCOSE, CAPILLARY
Glucose-Capillary: 66 mg/dL — ABNORMAL LOW (ref 70–99)
Glucose-Capillary: 91 mg/dL (ref 70–99)

## 2012-12-03 SURGERY — PHACOEMULSIFICATION, CATARACT, WITH IOL INSERTION
Anesthesia: Monitor Anesthesia Care | Site: Eye | Laterality: Right | Wound class: Clean

## 2012-12-03 MED ORDER — FENTANYL CITRATE 0.05 MG/ML IJ SOLN
25.0000 ug | INTRAMUSCULAR | Status: AC
  Administered 2012-12-03 (×2): 25 ug via INTRAVENOUS
  Filled 2012-12-03: qty 2

## 2012-12-03 MED ORDER — LACTATED RINGERS IV SOLN
INTRAVENOUS | Status: DC
  Administered 2012-12-03: 10:00:00 via INTRAVENOUS

## 2012-12-03 MED ORDER — DEXTROSE 50 % IV SOLN
INTRAVENOUS | Status: AC
  Filled 2012-12-03: qty 50

## 2012-12-03 MED ORDER — EPINEPHRINE HCL 1 MG/ML IJ SOLN
INTRAMUSCULAR | Status: DC | PRN
  Administered 2012-12-03: 11:00:00

## 2012-12-03 MED ORDER — KETOROLAC TROMETHAMINE 0.5 % OP SOLN
1.0000 [drp] | OPHTHALMIC | Status: AC
  Administered 2012-12-03 (×3): 1 [drp] via OPHTHALMIC

## 2012-12-03 MED ORDER — MIDAZOLAM HCL 2 MG/2ML IJ SOLN
INTRAMUSCULAR | Status: AC
  Filled 2012-12-03: qty 2

## 2012-12-03 MED ORDER — PROVISC 10 MG/ML IO SOLN
INTRAOCULAR | Status: DC | PRN
  Administered 2012-12-03: 8.5 mg via INTRAOCULAR

## 2012-12-03 MED ORDER — TETRACAINE HCL 0.5 % OP SOLN
1.0000 [drp] | OPHTHALMIC | Status: AC
  Administered 2012-12-03 (×3): 1 [drp] via OPHTHALMIC

## 2012-12-03 MED ORDER — MIDAZOLAM HCL 2 MG/2ML IJ SOLN
1.0000 mg | INTRAMUSCULAR | Status: DC | PRN
  Administered 2012-12-03 (×2): 2 mg via INTRAVENOUS
  Filled 2012-12-03: qty 2

## 2012-12-03 MED ORDER — DEXTROSE 50 % IV SOLN
12.5000 g | Freq: Once | INTRAVENOUS | Status: AC
  Administered 2012-12-03: 12.5 g via INTRAVENOUS

## 2012-12-03 MED ORDER — PHENYLEPHRINE HCL 2.5 % OP SOLN
1.0000 [drp] | OPHTHALMIC | Status: AC
  Administered 2012-12-03 (×3): 1 [drp] via OPHTHALMIC

## 2012-12-03 MED ORDER — CYCLOPENTOLATE-PHENYLEPHRINE 0.2-1 % OP SOLN
1.0000 [drp] | OPHTHALMIC | Status: AC
  Administered 2012-12-03 (×3): 1 [drp] via OPHTHALMIC

## 2012-12-03 MED ORDER — BSS IO SOLN
INTRAOCULAR | Status: DC | PRN
  Administered 2012-12-03: 15 mL via INTRAOCULAR

## 2012-12-03 MED ORDER — EPINEPHRINE HCL 1 MG/ML IJ SOLN
INTRAMUSCULAR | Status: AC
  Filled 2012-12-03: qty 1

## 2012-12-03 SURGICAL SUPPLY — 9 items
CLOTH BEACON ORANGE TIMEOUT ST (SAFETY) ×1 IMPLANT
EYE SHIELD UNIVERSAL CLEAR (GAUZE/BANDAGES/DRESSINGS) ×1 IMPLANT
GLOVE BIO SURGEON STRL SZ 6.5 (GLOVE) ×1 IMPLANT
GLOVE EXAM NITRILE LRG STRL (GLOVE) ×1 IMPLANT
PAD ARMBOARD 7.5X6 YLW CONV (MISCELLANEOUS) ×1 IMPLANT
SIGHTPATH CAT PROC W REG LENS (Ophthalmic Related) ×2 IMPLANT
TAPE SURG TRANSPORE 1 IN (GAUZE/BANDAGES/DRESSINGS) IMPLANT
TAPE SURGICAL TRANSPORE 1 IN (GAUZE/BANDAGES/DRESSINGS) ×1
WATER STERILE IRR 250ML POUR (IV SOLUTION) ×1 IMPLANT

## 2012-12-03 NOTE — Anesthesia Preprocedure Evaluation (Addendum)
Anesthesia Evaluation  Patient identified by MRN, date of birth, ID band Patient awake  General Assessment Comment:HR 40's, will resume beta blocker when he returns home today.  Reviewed: Allergy & Precautions, H&P , Patient's Chart, lab work & pertinent test results, reviewed documented beta blocker date and time   Airway Mallampati: IV TM Distance: >3 FB Neck ROM: Full    Dental  (+) Teeth Intact   Pulmonary former smoker,  breath sounds clear to auscultation        Cardiovascular hypertension, Pt. on medications + angina with exertion + CAD, + Past MI and + Cardiac Stents Rhythm:Regular Rate:Normal     Neuro/Psych    GI/Hepatic   Endo/Other  diabetes, Type 2, Oral Hypoglycemic AgentsMorbid obesity  Renal/GU      Musculoskeletal   Abdominal   Peds  Hematology   Anesthesia Other Findings   Reproductive/Obstetrics                           Anesthesia Physical Anesthesia Plan  ASA: III  Anesthesia Plan: MAC   Post-op Pain Management:    Induction: Intravenous  Airway Management Planned: Nasal Cannula  Additional Equipment:   Intra-op Plan:   Post-operative Plan:   Informed Consent: I have reviewed the patients History and Physical, chart, labs and discussed the procedure including the risks, benefits and alternatives for the proposed anesthesia with the patient or authorized representative who has indicated his/her understanding and acceptance.     Plan Discussed with:   Anesthesia Plan Comments:         Anesthesia Quick Evaluation  

## 2012-12-03 NOTE — H&P (Signed)
The patient was re examined and there is no change in the patients condition since the original H and P. 

## 2012-12-03 NOTE — Op Note (Signed)
Patient brought to the operating room and prepped and draped in the usual manner.  Lid speculum inserted in right eye.  Stab incision made at the twelve o'clock position.  Provisc instilled in the anterior chamber.   A 2.4 mm. Stab incision was made temporally.  An anterior capsulotomy was done with a bent 25 gauge needle.  The nucleus was hydrodissected.  The Phaco tip was inserted in the anterior chamber and the nucleus was emulsified.  CDE was 2.41.  The cortical material was then removed with the I and A tip.  Posterior capsule was the polished.  The anterior chamber was deepened with Provisc.  A 20.0 Diopter Rayner 570C IOL was then inserted in the capsular bag.  Provisc was then removed with the I and A tip.  The wound was then hydrated.  Patient sent to the Recovery Room in good condition with follow up in my office.  Preoperative Diagnosis:  Nuclear Cataract OD Postoperative Diagnosis:  Same Procedure name: Kelman Phacoemulsification OD with IOL

## 2012-12-03 NOTE — Anesthesia Postprocedure Evaluation (Signed)
  Anesthesia Post-op Note  Patient: Steven Stevens  Procedure(s) Performed: Procedure(s): CATARACT EXTRACTION RIGHT EYE (WITH PHACO) AND INTRAOCULAR LENS PLACEMENT  CDE=2.41 (Right)  Patient Location: Short Stay  Anesthesia Type:MAC  Level of Consciousness: awake, alert  and oriented  Airway and Oxygen Therapy: Patient Spontanous Breathing  Post-op Pain: none  Post-op Assessment: Post-op Vital signs reviewed, Patient's Cardiovascular Status Stable, Respiratory Function Stable, Patent Airway and No signs of Nausea or vomiting  Post-op Vital Signs: Reviewed and stable  Complications: No apparent anesthesia complications

## 2012-12-03 NOTE — Transfer of Care (Signed)
Immediate Anesthesia Transfer of Care Note  Patient: Steven Stevens  Procedure(s) Performed: Procedure(s): CATARACT EXTRACTION RIGHT EYE (WITH PHACO) AND INTRAOCULAR LENS PLACEMENT  CDE=2.41 (Right)  Patient Location: Short Stay  Anesthesia Type:MAC  Level of Consciousness: awake  Airway & Oxygen Therapy: Patient Spontanous Breathing  Post-op Assessment: Report given to PACU RN  Post vital signs: Reviewed  Complications: No apparent anesthesia complications

## 2012-12-04 ENCOUNTER — Encounter (HOSPITAL_COMMUNITY): Payer: Self-pay | Admitting: Ophthalmology

## 2012-12-11 ENCOUNTER — Encounter (HOSPITAL_COMMUNITY): Payer: Medicare Other | Attending: Pulmonary Disease

## 2012-12-11 MED ORDER — FENTANYL CITRATE 0.05 MG/ML IJ SOLN
25.0000 ug | INTRAMUSCULAR | Status: DC | PRN
Start: 2012-12-11 — End: 2012-12-17

## 2012-12-11 MED ORDER — ONDANSETRON HCL 4 MG/2ML IJ SOLN: 4.0000 mg | Freq: Once | INTRAMUSCULAR | Status: AC | PRN

## 2012-12-17 ENCOUNTER — Encounter (HOSPITAL_COMMUNITY): Payer: Self-pay | Admitting: *Deleted

## 2012-12-17 ENCOUNTER — Encounter (HOSPITAL_COMMUNITY)
Admission: RE | Disposition: A | Discharge status: Home / Self Care | Payer: Self-pay | Source: Ambulatory Visit | Attending: Ophthalmology

## 2012-12-17 ENCOUNTER — Encounter (HOSPITAL_COMMUNITY): Payer: Medicare Other | Admitting: Anesthesiology

## 2012-12-17 ENCOUNTER — Ambulatory Visit (HOSPITAL_COMMUNITY): Payer: Medicare Other | Admitting: Anesthesiology

## 2012-12-17 ENCOUNTER — Ambulatory Visit (HOSPITAL_COMMUNITY)
Admission: RE | Admit: 2012-12-17 | Discharge: 2012-12-17 | Disposition: A | Discharge status: Home / Self Care | Payer: Medicare Other | Source: Ambulatory Visit | Attending: Ophthalmology | Admitting: Ophthalmology

## 2012-12-17 DIAGNOSIS — I10 Essential (primary) hypertension: Secondary | ICD-10-CM | POA: Insufficient documentation

## 2012-12-17 DIAGNOSIS — Z01812 Encounter for preprocedural laboratory examination: Secondary | ICD-10-CM | POA: Insufficient documentation

## 2012-12-17 DIAGNOSIS — H2510 Age-related nuclear cataract, unspecified eye: Secondary | ICD-10-CM | POA: Insufficient documentation

## 2012-12-17 DIAGNOSIS — E119 Type 2 diabetes mellitus without complications: Secondary | ICD-10-CM | POA: Insufficient documentation

## 2012-12-17 HISTORY — PX: CATARACT EXTRACTION W/PHACO: SHX586

## 2012-12-17 LAB — GLUCOSE, CAPILLARY: Glucose-Capillary: 121 mg/dL — ABNORMAL HIGH (ref 70–99)

## 2012-12-17 SURGERY — PHACOEMULSIFICATION, CATARACT, WITH IOL INSERTION
Anesthesia: Monitor Anesthesia Care | Site: Eye | Laterality: Left | Wound class: Clean

## 2012-12-17 MED ORDER — LACTATED RINGERS IV SOLN
INTRAVENOUS | Status: DC
  Administered 2012-12-17: 1000 mL via INTRAVENOUS

## 2012-12-17 MED ORDER — FENTANYL CITRATE 0.05 MG/ML IJ SOLN
25.0000 ug | INTRAMUSCULAR | Status: AC
  Administered 2012-12-17 (×2): 25 ug via INTRAVENOUS

## 2012-12-17 MED ORDER — MIDAZOLAM HCL 2 MG/2ML IJ SOLN
1.0000 mg | INTRAMUSCULAR | Status: DC | PRN
  Administered 2012-12-17: 2 mg via INTRAVENOUS

## 2012-12-17 MED ORDER — BUPIVACAINE HCL 0.5 % IJ SOLN
INTRAMUSCULAR | Status: DC | PRN
  Administered 2012-12-17: 5 mL

## 2012-12-17 MED ORDER — EPINEPHRINE HCL 1 MG/ML IJ SOLN
INTRAMUSCULAR | Status: AC
  Filled 2012-12-17: qty 1

## 2012-12-17 MED ORDER — BUPIVACAINE HCL (PF) 0.5 % IJ SOLN
INTRAMUSCULAR | Status: AC
  Filled 2012-12-17: qty 30

## 2012-12-17 MED ORDER — PHENYLEPHRINE HCL 2.5 % OP SOLN
1.0000 [drp] | OPHTHALMIC | Status: AC
  Administered 2012-12-17 (×3): 1 [drp] via OPHTHALMIC

## 2012-12-17 MED ORDER — BSS IO SOLN
INTRAOCULAR | Status: DC | PRN
  Administered 2012-12-17: 15 mL via INTRAOCULAR

## 2012-12-17 MED ORDER — FENTANYL CITRATE 0.05 MG/ML IJ SOLN
INTRAMUSCULAR | Status: AC
  Filled 2012-12-17: qty 2

## 2012-12-17 MED ORDER — KETOROLAC TROMETHAMINE 0.5 % OP SOLN
1.0000 [drp] | OPHTHALMIC | Status: AC
  Administered 2012-12-17 (×3): 1 [drp] via OPHTHALMIC

## 2012-12-17 MED ORDER — PROVISC 10 MG/ML IO SOLN
INTRAOCULAR | Status: DC | PRN
  Administered 2012-12-17: 0.85 mL via INTRAOCULAR

## 2012-12-17 MED ORDER — TETRACAINE HCL 0.5 % OP SOLN
1.0000 [drp] | OPHTHALMIC | Status: AC
  Administered 2012-12-17 (×3): 1 [drp] via OPHTHALMIC

## 2012-12-17 MED ORDER — LIDOCAINE HCL (PF) 1 % IJ SOLN
INTRAMUSCULAR | Status: DC | PRN
  Administered 2012-12-17: .5 mL

## 2012-12-17 MED ORDER — LIDOCAINE HCL (PF) 1 % IJ SOLN
INTRAMUSCULAR | Status: AC
  Filled 2012-12-17: qty 30

## 2012-12-17 MED ORDER — BSS IO SOLN
INTRAOCULAR | Status: DC | PRN
  Administered 2012-12-17: 09:00:00

## 2012-12-17 MED ORDER — MIDAZOLAM HCL 2 MG/2ML IJ SOLN
INTRAMUSCULAR | Status: AC
  Filled 2012-12-17: qty 2

## 2012-12-17 MED ORDER — CYCLOPENTOLATE-PHENYLEPHRINE 0.2-1 % OP SOLN
1.0000 [drp] | OPHTHALMIC | Status: AC
  Administered 2012-12-17 (×3): 1 [drp] via OPHTHALMIC

## 2012-12-17 MED ORDER — LIDOCAINE HCL (PF) 1 % IJ SOLN
INTRAMUSCULAR | Status: AC
  Filled 2012-12-17: qty 2

## 2012-12-17 SURGICAL SUPPLY — 23 items

## 2012-12-17 NOTE — Anesthesia Postprocedure Evaluation (Signed)
  Anesthesia Post-op Note  Patient: Steven Stevens  Procedure(s) Performed: Procedure(s) with comments: CATARACT EXTRACTION PHACO AND INTRAOCULAR LENS PLACEMENT (IOC) (Left) - CDE:7.82  Patient Location: Short Stay  Anesthesia Type:MAC  Level of Consciousness: awake, alert  and oriented  Airway and Oxygen Therapy: Patient Spontanous Breathing  Post-op Pain: none  Post-op Assessment: Post-op Vital signs reviewed, Patient's Cardiovascular Status Stable, Respiratory Function Stable, Patent Airway and No signs of Nausea or vomiting  Post-op Vital Signs: Reviewed and stable  Complications: No apparent anesthesia complications

## 2012-12-17 NOTE — Anesthesia Preprocedure Evaluation (Signed)
Anesthesia Evaluation  Patient identified by MRN, date of birth, ID band Patient awake  General Assessment Comment:HR 40's, will resume beta blocker when he returns home today.  Reviewed: Allergy & Precautions, H&P , Patient's Chart, lab work & pertinent test results, reviewed documented beta blocker date and time   Airway Mallampati: IV TM Distance: >3 FB Neck ROM: Full    Dental  (+) Teeth Intact   Pulmonary former smoker,  breath sounds clear to auscultation        Cardiovascular hypertension, Pt. on medications + angina with exertion + CAD, + Past MI and + Cardiac Stents Rhythm:Regular Rate:Normal     Neuro/Psych    GI/Hepatic   Endo/Other  diabetes, Type 2, Oral Hypoglycemic AgentsMorbid obesity  Renal/GU      Musculoskeletal   Abdominal   Peds  Hematology   Anesthesia Other Findings   Reproductive/Obstetrics                           Anesthesia Physical Anesthesia Plan  ASA: III  Anesthesia Plan: MAC   Post-op Pain Management:    Induction: Intravenous  Airway Management Planned: Nasal Cannula  Additional Equipment:   Intra-op Plan:   Post-operative Plan:   Informed Consent: I have reviewed the patients History and Physical, chart, labs and discussed the procedure including the risks, benefits and alternatives for the proposed anesthesia with the patient or authorized representative who has indicated his/her understanding and acceptance.     Plan Discussed with:   Anesthesia Plan Comments:         Anesthesia Quick Evaluation

## 2012-12-17 NOTE — H&P (Signed)
The patient was re examined and there is no change in the patients condition since the original H and P. 

## 2012-12-17 NOTE — Op Note (Signed)
Patient brought to the operating room and prepped and draped in the usual manner. A Van Lint block was given with Marcaine and Xylocaine.  Lid speculum inserted in left eye.  Stab incision made at the twelve o'clock position.  Provisc instilled in the anterior chamber.   A 2.4 mm. Stab incision was made temporally.  An anterior capsulotomy was done with a bent 25 gauge needle.  The nucleus was hydrodissected.  The Phaco tip was inserted in the anterior chamber and the nucleus was emulsified.  CDE was 7.82.  The cortical material was then removed with the I and A tip.  Posterior capsule was the polished.  The anterior chamber was deepened with Provisc.  A 19.5 Diopter Rayner 570C IOL was then inserted in the capsular bag.  Provisc was then removed with the I and A tip.  The wound was then hydrated.  Patient sent to the Recovery Room in good condition with follow up in my office.  Preoperative Diagnosis:  Nuclear Cataract OS Postoperative Diagnosis:  Same Procedure name: Kelman Phacoemulsification OS with IOL

## 2012-12-17 NOTE — Transfer of Care (Signed)
Immediate Anesthesia Transfer of Care Note  Patient: Steven Stevens  Procedure(s) Performed: Procedure(s) with comments: CATARACT EXTRACTION PHACO AND INTRAOCULAR LENS PLACEMENT (IOC) (Left) - CDE:7.82  Patient Location: Short Stay  Anesthesia Type:MAC  Level of Consciousness: awake  Airway & Oxygen Therapy: Patient Spontanous Breathing  Post-op Assessment: Report given to PACU RN  Post vital signs: Reviewed  Complications: No apparent anesthesia complications

## 2012-12-18 ENCOUNTER — Encounter (HOSPITAL_COMMUNITY): Payer: Self-pay | Admitting: Ophthalmology

## 2012-12-23 ENCOUNTER — Encounter: Payer: Self-pay | Admitting: *Deleted

## 2012-12-23 ENCOUNTER — Other Ambulatory Visit: Payer: Self-pay | Admitting: *Deleted

## 2012-12-23 DIAGNOSIS — E782 Mixed hyperlipidemia: Secondary | ICD-10-CM

## 2012-12-23 DIAGNOSIS — I10 Essential (primary) hypertension: Secondary | ICD-10-CM

## 2012-12-26 LAB — BASIC METABOLIC PANEL
BUN: 15 mg/dL (ref 6–23)
CO2: 28 mEq/L (ref 19–32)
Chloride: 103 mEq/L (ref 96–112)
Creat: 1.25 mg/dL (ref 0.50–1.35)
Potassium: 4.4 mEq/L (ref 3.5–5.3)

## 2012-12-26 LAB — LIPID PANEL
Cholesterol: 162 mg/dL (ref 0–200)
LDL Cholesterol: 87 mg/dL (ref 0–99)
Triglycerides: 199 mg/dL — ABNORMAL HIGH (ref <150)

## 2013-01-10 ENCOUNTER — Telehealth: Payer: Self-pay | Admitting: *Deleted

## 2013-01-10 NOTE — Telephone Encounter (Signed)
FYI: pt did not reply to several attempts made from the referral you ordered for the pt to have outpatient nutrional management per phone note from [facility] dietician Shirl Harris phone note encounter dated 11-13-2012

## 2013-01-20 ENCOUNTER — Encounter (HOSPITAL_COMMUNITY): Payer: Self-pay

## 2013-05-09 ENCOUNTER — Ambulatory Visit (INDEPENDENT_AMBULATORY_CARE_PROVIDER_SITE_OTHER): Payer: Medicare Other | Admitting: Adult Health

## 2013-05-09 ENCOUNTER — Ambulatory Visit: Payer: Medicare Other | Admitting: Internal Medicine

## 2013-05-09 ENCOUNTER — Encounter: Payer: Self-pay | Admitting: Adult Health

## 2013-05-09 VITALS — BP 147/61 | HR 64 | Ht 68.0 in | Wt 319.0 lb

## 2013-05-09 DIAGNOSIS — I2510 Atherosclerotic heart disease of native coronary artery without angina pectoris: Secondary | ICD-10-CM

## 2013-05-09 DIAGNOSIS — E782 Mixed hyperlipidemia: Secondary | ICD-10-CM

## 2013-05-09 DIAGNOSIS — I10 Essential (primary) hypertension: Secondary | ICD-10-CM

## 2013-05-09 DIAGNOSIS — E785 Hyperlipidemia, unspecified: Secondary | ICD-10-CM

## 2013-05-09 MED ORDER — LOSARTAN POTASSIUM-HCTZ 100-25 MG PO TABS: 1.0000 | ORAL_TABLET | Freq: Every day | ORAL | Status: DC

## 2013-05-09 NOTE — Progress Notes (Signed)
HPI: [name] is a 68 year old patient of Dr. Domenic Polite, we are following for ongoing assessment and management of prior myocardial infarction, he was last seen in the office in May of 2012, for followup with drug-eluting stent to the mid and distal right coronary artery in 2005 with known residual occlusion of the mid left anterior descending associated with distal collateralization. He had a recent Myoview in 2012 revealing a defect most consistent with scar and some evidence of ischemia consistent with previous coronary anatomy.   The patient comes today feeling well but is unhappy with his weight gain over the winter. He admits to not being very active during the winter months. He also admits to eating salty foods. His daughter, is a Designer, jewellery. She was advised him to avoid salt, but he states he is having trouble with this.   He denies any chest pain, he is having some dyspnea on exertion due to his morbid obesity, he denies any wheezing or worsening fatigue. He is medically compliant.  No Known Allergies  Current Outpatient Prescriptions  Medication Sig Dispense Refill  . ALPRAZolam (XANAX) 1 MG tablet Take 1 mg by mouth 4 (four) times daily as needed for anxiety.       Marland Kitchen aspirin 325 MG tablet Take 325 mg by mouth daily.      Marland Kitchen atorvastatin (LIPITOR) 40 MG tablet Take 1 tablet (40 mg total) by mouth daily.  90 tablet  1  . clopidogrel (PLAVIX) 75 MG tablet Take 75 mg by mouth daily.      Marland Kitchen glyBURIDE-metformin (GLUCOVANCE) 5-500 MG per tablet Take 1 tablet by mouth 2 (two) times daily with a meal.      . HYDROcodone-acetaminophen (LORTAB) 10-500 MG per tablet Take 1 tablet by mouth every 6 (six) hours as needed for pain.       . meclizine (ANTIVERT) 25 MG tablet Take 25 mg by mouth daily.       . metoprolol tartrate (LOPRESSOR) 25 MG tablet Take 12.5 mg by mouth 2 (two) times daily.      . nitroGLYCERIN (NITROSTAT) 0.4 MG SL tablet Place 1 tablet (0.4 mg total) under the tongue  every 5 (five) minutes as needed for chest pain.  25 tablet  4  . ONE TOUCH ULTRA TEST test strip       . terazosin (HYTRIN) 5 MG capsule Take 5 mg by mouth at bedtime.      Marland Kitchen losartan-hydrochlorothiazide (HYZAAR) 100-25 MG per tablet Take 1 tablet by mouth daily.  30 tablet  6   No current facility-administered medications for this visit.    Past Medical History  Diagnosis Date  . Hypertension   . High cholesterol   . Diabetes mellitus   . Myocardial infarct 2005    Past Surgical History  Procedure Laterality Date  . Hernia repair    . Back surgery    . Colonoscopy    . Coronary angioplasty with stent placement  2005  . Cataract extraction w/phaco Right 12/03/2012    Procedure: CATARACT EXTRACTION RIGHT EYE (WITH PHACO) AND INTRAOCULAR LENS PLACEMENT  CDE=2.41;  Surgeon: Elta Guadeloupe [name], MD;  Location: [facility];  Service: Ophthalmology;  Laterality: Right;  . Cataract extraction w/phaco Left 12/17/2012    Procedure: CATARACT EXTRACTION PHACO AND INTRAOCULAR LENS PLACEMENT (IOC);  Surgeon: Elta Guadeloupe [name], MD;  Location: [facility];  Service: Ophthalmology;  Laterality: Left;  CDE:7.82    ROS:  Review of systems complete and found to  be negative unless listed above  PHYSICAL EXAM BP 147/61  Pulse 64  Ht 5\' 8"  (1.727 m)  Wt 319 lb (144.697 kg)  BMI 48.51 kg/m2 General: Well developed, well nourished, in no acute distress, obese Head: Eyes PERRLA, No xanthomas.   Normal cephalic and atramatic  Lungs: Clear bilaterally to auscultation and percussion. Heart: HRRR S1 S2, without MRG.  Pulses are 2+ & equal.            No carotid bruit. No JVD.  No abdominal bruits. No femoral bruits. Abdomen: Bowel sounds are positive, abdomen soft , obese, non-tender without masses or                  Hernia's noted. Msk:  Back normal, normal gait. Normal strength and tone for age. Extremities: No clubbing, cyanosis 1+ bilateral edema.  DP +1 Neuro: Alert and oriented X 3. Psych:  Good affect,  responds appropriately   ASSESSMENT AND PLAN

## 2013-05-09 NOTE — Assessment & Plan Note (Signed)
He will have followup lipids and LFTs completed dear Dr. Luan Pulling office. Most recent studies were completed in November 2000 4T with a total cholesterol 162, triglycerides 199, HDL 35, LDL 87. Low cholesterol diet is also recommended with his healthy lifestyle changes.

## 2013-05-09 NOTE — Assessment & Plan Note (Signed)
He is without cardiac complaints today, his blood pressure is elevated. All increase his losartan HCTZ to 100/25 mg daily. I have given him pamphlets on a low-sodium diet and counseled him for same. I have also advised since he walks daily. He states during the spring through fall he walks approximately 1 mile a day but has not done so over the winter.  He will be seen again in 6 months unless he becomes symptomatic. Labs are followed by his primary care physician Dr. Luan Pulling.

## 2013-05-09 NOTE — Patient Instructions (Signed)
Your physician recommends that you schedule a follow-up appointment in: 6 months You will receive a reminder letter two months in advance reminding you to call and schedule your appointment. If you don't receive this letter, please contact our office.  Your physician has recommended you make the following change in your medication:  Increase Hyzaar to 100/25 mg daily

## 2013-05-09 NOTE — Progress Notes (Deleted)
Name: Steven Stevens    DOB: May 31, 1945  Age: 68 y.o.  MR#: [id number]       PCP:  Alonza Bogus, MD      Insurance: Payor: [employer] MEDICARE / Plan: AARP MEDICARE COMPLETE / Product Type: *No Product type* /   CC:   No chief complaint on file.   VS Filed Vitals:   05/09/13 1417  BP: 147/61  Pulse: 64  Height: $Remove'5\' 8"'VdDHBAt$  (1.727 m)  Weight: 319 lb (144.697 kg)    Weights Current Weight  05/09/13 319 lb (144.697 kg)  12/17/12 312 lb (141.522 kg)  12/17/12 312 lb (141.522 kg)    Blood Pressure  BP Readings from Last 3 Encounters:  05/09/13 147/61  12/17/12 118/75  12/17/12 118/75     Admit date:  (Not on file) Last encounter with RMR:  Visit date not found   Allergy Review of patient's allergies indicates no known allergies.  Current Outpatient Prescriptions  Medication Sig Dispense Refill  . ALPRAZolam (XANAX) 1 MG tablet Take 1 mg by mouth 4 (four) times daily as needed for anxiety.       Marland Kitchen aspirin 325 MG tablet Take 325 mg by mouth daily.      Marland Kitchen atorvastatin (LIPITOR) 40 MG tablet Take 1 tablet (40 mg total) by mouth daily.  90 tablet  1  . clopidogrel (PLAVIX) 75 MG tablet Take 75 mg by mouth daily.      Marland Kitchen glyBURIDE-metformin (GLUCOVANCE) 5-500 MG per tablet Take 1 tablet by mouth 2 (two) times daily with a meal.      . HYDROcodone-acetaminophen (LORTAB) 10-500 MG per tablet Take 1 tablet by mouth every 6 (six) hours as needed for pain.       Marland Kitchen losartan-hydrochlorothiazide (HYZAAR) 100-12.5 MG per tablet Take 1 tablet by mouth daily.  30 tablet  6  . meclizine (ANTIVERT) 25 MG tablet Take 25 mg by mouth daily.       . metoprolol tartrate (LOPRESSOR) 25 MG tablet Take 12.5 mg by mouth 2 (two) times daily.      . nitroGLYCERIN (NITROSTAT) 0.4 MG SL tablet Place 1 tablet (0.4 mg total) under the tongue every 5 (five) minutes as needed for chest pain.  25 tablet  4  . ONE TOUCH ULTRA TEST test strip       . terazosin (HYTRIN) 5 MG capsule Take 5 mg by mouth at  bedtime.       No current facility-administered medications for this visit.    Discontinued Meds:    Medications Discontinued During This Encounter  Medication Reason  . lisinopril (PRINIVIL,ZESTRIL) 2.5 MG tablet Error    Patient Active Problem List   Diagnosis Date Noted  . Other and unspecified hyperlipidemia 05/31/2012  . CAD (coronary artery disease) 05/31/2012    LABS    Component Value Date/Time   NA 142 12/26/2012 0831   NA 138 11/26/2012 0900   NA 141 06/18/2012 1130   K 4.4 12/26/2012 0831   K 4.3 11/26/2012 0900   K 4.5 06/18/2012 1130   CL 103 12/26/2012 0831   CL 99 11/26/2012 0900   CL 103 06/18/2012 1130   CO2 28 12/26/2012 0831   CO2 31 11/26/2012 0900   CO2 31 06/18/2012 1130   GLUCOSE 133* 12/26/2012 0831   GLUCOSE 113* 11/26/2012 0900   GLUCOSE 102* 06/18/2012 1130   BUN 15 12/26/2012 0831   BUN 17 11/26/2012 0900   BUN 16 06/18/2012 1130   CREATININE  1.25 12/26/2012 0831   CREATININE 1.27 11/26/2012 0900   CREATININE 1.20 06/18/2012 1130   CREATININE 1.19 02/04/2012 0650   CREATININE 1.14 12/17/2009 1741   CALCIUM 9.2 12/26/2012 0831   CALCIUM 9.6 11/26/2012 0900   CALCIUM 9.2 06/18/2012 1130   GFRNONAA 57* 11/26/2012 0900   GFRNONAA 62* 02/04/2012 0650   GFRNONAA >60 12/17/2009 1741   GFRAA 66* 11/26/2012 0900   GFRAA 72* 02/04/2012 0650   GFRAA  Value: >60        The eGFR has been calculated using the MDRD equation. This calculation has not been validated in all clinical situations. eGFR's persistently <60 mL/min signify possible Chronic Kidney Disease. 12/17/2009 1741   CMP     Component Value Date/Time   NA 142 12/26/2012 0831   K 4.4 12/26/2012 0831   CL 103 12/26/2012 0831   CO2 28 12/26/2012 0831   GLUCOSE 133* 12/26/2012 0831   BUN 15 12/26/2012 0831   CREATININE 1.25 12/26/2012 0831   CREATININE 1.27 11/26/2012 0900   CALCIUM 9.2 12/26/2012 0831   PROT 7.0 05/21/2008 1316   ALBUMIN 3.9 05/21/2008 1316   AST 19 12/26/2012 0829    ALT 26 05/21/2008 1316   ALKPHOS 38* 05/21/2008 1316   BILITOT 0.7 05/21/2008 1316   GFRNONAA 57* 11/26/2012 0900   GFRAA 66* 11/26/2012 0900       Component Value Date/Time   WBC 6.6 11/26/2012 0930   WBC 9.5 02/04/2012 0650   WBC 8.7 12/17/2009 1741   HGB 13.0 11/26/2012 0930   HGB 12.9* 02/04/2012 0650   HGB 13.4 12/17/2009 1741   HCT 38.6* 11/26/2012 0930   HCT 38.3* 02/04/2012 0650   HCT 39.7 12/17/2009 1741   MCV 89.4 11/26/2012 0930   MCV 89.5 02/04/2012 0650   MCV 89.1 12/17/2009 1741    Lipid Panel     Component Value Date/Time   CHOL 162 12/26/2012 0826   TRIG 199* 12/26/2012 0826   HDL 35* 12/26/2012 0826   CHOLHDL 4.6 12/26/2012 0826   VLDL 40 12/26/2012 0826   LDLCALC 87 12/26/2012 0826    ABG No results found for this basename: phart, pco2, pco2art, po2, po2art, hco3, tco2, acidbasedef, o2sat     No results found for this basename: TSH   BNP (last 3 results) No results found for this basename: PROBNP,  in the last 8760 hours Cardiac Panel (last 3 results) No results found for this basename: CKTOTAL, CKMB, TROPONINI, RELINDX,  in the last 72 hours  Iron/TIBC/Ferritin No results found for this basename: iron, tibc, ferritin     EKG Orders placed in visit on 06/05/12  . EKG 12-LEAD     Prior Assessment and Plan Problem List as of 05/09/2013     Cardiovascular and Mediastinum   CAD (coronary artery disease)     Other   Other and unspecified hyperlipidemia       Imaging: No results found.

## 2013-05-14 ENCOUNTER — Other Ambulatory Visit: Payer: Self-pay | Admitting: Internal Medicine

## 2013-10-29 ENCOUNTER — Ambulatory Visit (INDEPENDENT_AMBULATORY_CARE_PROVIDER_SITE_OTHER): Payer: Medicare Other | Admitting: Physician Assistant

## 2013-10-29 ENCOUNTER — Encounter: Payer: Self-pay | Admitting: Physician Assistant

## 2013-10-29 VITALS — BP 102/62 | HR 67 | Ht 68.0 in | Wt 313.0 lb

## 2013-10-29 DIAGNOSIS — E785 Hyperlipidemia, unspecified: Secondary | ICD-10-CM

## 2013-10-29 DIAGNOSIS — I10 Essential (primary) hypertension: Secondary | ICD-10-CM

## 2013-10-29 DIAGNOSIS — I2510 Atherosclerotic heart disease of native coronary artery without angina pectoris: Secondary | ICD-10-CM

## 2013-10-29 NOTE — Progress Notes (Signed)
HPI: This is a 68 year old male patient of Dr. Luan Pulling and Dr. Harrington Challenger who has history of coronary artery disease status post drug-eluting stent to the mid and distal RCA in 2005 with chronic occlusion in the LAD with distal collateralization.EF 55% with inferobasal HK. Myoview in 2012 revealed defect most consistent with scar and some evidence of ischemia consistent with previous anatomy. Patient also has history of hypertension, hyperlipidemia, and diabetes mellitus.  Patient comes in today for routine followup. Overall he is doing well. He gets short of breath and mild chest tightness if he overexerts himself by lifting something too heavy. It goes away immediately with rest and he never has to use nitroglycerin. [place] and does whatever he wants to do without angina. Unfortunately he continues to gain weight and now weighs 313 pounds. He used to walk 1 mile twice a day but stopped doing this. He also eats out 2 or 3 days a week. He is followed by Dr. Luan Pulling for his other medical problems and recently had blood work which was stable.    No Known Allergies   Current Outpatient Prescriptions  Medication Sig Dispense Refill  . ALPRAZolam (XANAX) 1 MG tablet Take 1 mg by mouth 4 (four) times daily as needed for anxiety.       Marland Kitchen aspirin 325 MG tablet Take 325 mg by mouth daily.      Marland Kitchen atorvastatin (LIPITOR) 40 MG tablet TAKE 1 TABLET BY MOUTH EVERY DAY  90 tablet  3  . clopidogrel (PLAVIX) 75 MG tablet Take 75 mg by mouth daily.      Marland Kitchen glyBURIDE-metformin (GLUCOVANCE) 5-500 MG per tablet Take 1 tablet by mouth 2 (two) times daily with a meal.      . HYDROcodone-acetaminophen (LORTAB) 10-500 MG per tablet Take 1 tablet by mouth every 6 (six) hours as needed for pain.       Marland Kitchen losartan-hydrochlorothiazide (HYZAAR) 100-25 MG per tablet Take 1 tablet by mouth daily.  30 tablet  6  . meclizine (ANTIVERT) 25 MG tablet Take 25 mg by mouth daily.       . metoprolol  tartrate (LOPRESSOR) 25 MG tablet Take 12.5 mg by mouth 2 (two) times daily.      . nitroGLYCERIN (NITROSTAT) 0.4 MG SL tablet Place 1 tablet (0.4 mg total) under the tongue every 5 (five) minutes as needed for chest pain.  25 tablet  4  . ONE TOUCH ULTRA TEST test strip       . terazosin (HYTRIN) 5 MG capsule Take 5 mg by mouth at bedtime.       No current facility-administered medications for this visit.    Past Medical History  Diagnosis Date  . Hypertension   . High cholesterol   . Diabetes mellitus   . Myocardial infarct 2005    Past Surgical History  Procedure Laterality Date  . Hernia repair    . Back surgery    . Colonoscopy    . Coronary angioplasty with stent placement  2005  . Cataract extraction w/phaco Right 12/03/2012    Procedure: CATARACT EXTRACTION RIGHT EYE (WITH PHACO) AND INTRAOCULAR LENS PLACEMENT  CDE=2.41;  Surgeon: Elta Guadeloupe [name], MD;  Location: [facility];  Service: Ophthalmology;  Laterality: Right;  . Cataract extraction w/phaco Left 12/17/2012    Procedure: CATARACT EXTRACTION PHACO AND INTRAOCULAR LENS PLACEMENT (IOC);  Surgeon: Elta Guadeloupe [name], MD;  Location: [facility];  Service: Ophthalmology;  Laterality: Left;  CDE:7.82    No family history on file.  History   Social History  . Marital Status: Married    Spouse Name: N/A    Number of Children: N/A  . Years of Education: N/A   Occupational History  . Not on file.   Social History Main Topics  . Smoking status: Former Research scientist (life sciences)  . Smokeless tobacco: Not on file  . Alcohol Use: No  . Drug Use: No  . Sexual Activity: Not on file   Other Topics Concern  . Not on file   Social History Narrative  . No narrative on file    ROS: See history of present illness otherwise negative  BP 102/62  Pulse 67  Ht 5\' 8"  (1.727 m)  Wt 313 lb (141.976 kg)  BMI 47.60 kg/m2   PHYSICAL EXAM: Obese, in no acute distress. Neck: No JVD, HJR, Bruit, or thyroid enlargement  Lungs: No tachypnea, clear  without wheezing, rales, or rhonchi  Cardiovascular: RRR, PMI not displaced, positive S4, 2/6 systolic murmur at the left sternal border, no bruit, thrill, or heave.  Abdomen: BS normal. Soft without organomegaly, masses, lesions or tenderness.  Extremities: without cyanosis, clubbing or edema. Good distal pulses bilateral  SKin: Warm, no lesions or rashes   Musculoskeletal: No deformities  Neuro: no focal signs   Wt Readings from Last 3 Encounters:  05/09/13 319 lb (144.697 kg)  12/17/12 312 lb (141.522 kg)  12/17/12 312 lb (141.522 kg)     EKG: Normal sinus rhythm prior inferior infarct, no acute change

## 2013-10-29 NOTE — Assessment & Plan Note (Signed)
I had along discussion with the patient concerning his weight gain. Discussed a weight loss program and exercise program. He is willing to try and says he'll be 25 pounds less on his next office visit.

## 2013-10-29 NOTE — Assessment & Plan Note (Signed)
Stable without chest pain. Continue Plavix, aspirin and metoprolol.

## 2013-10-29 NOTE — Assessment & Plan Note (Signed)
Controlled.  

## 2013-10-29 NOTE — Patient Instructions (Signed)
Your physician wants you to follow-up in: 6 months with Dr. Theressa Stamps will receive a reminder letter in the mail two months in advance. If you don't receive a letter, please call our office to schedule the follow-up appointment.  Your physician recommends that you continue on your current medications as directed. Please refer to the Current Medication list given to you today.  Your physician encouraged you to lose weight for better health.  Thank you for choosing [facility]!!

## 2013-10-29 NOTE — Assessment & Plan Note (Signed)
Treated by Dr. Luan Pulling

## 2013-11-08 ENCOUNTER — Other Ambulatory Visit: Payer: Self-pay | Admitting: Internal Medicine

## 2013-11-21 ENCOUNTER — Other Ambulatory Visit: Payer: Self-pay

## 2014-01-26 NOTE — Discharge Instructions (Signed)
Steven Stevens  01/26/2014     Instructions    Activity: No Restrictions.   Diet: Resume Diet you were on at home.   Pain Medication: Tylenol if Needed.   CONTACT YOUR DOCTOR IF YOU HAVE PAIN, REDNESS IN YOUR EYE, OR DECREASED VISION.   Follow-up: today between 2:00-3:00  with Elta Guadeloupe [name], MD.   Dr. Gershon Crane: [phone]        If you find that you cannot contact your physician, but feel that your signs and   Symptoms warrant a physician's attention, call the Emergency Room at   [phone] ext.[phone].

## 2014-01-27 ENCOUNTER — Encounter (HOSPITAL_COMMUNITY): Payer: Self-pay | Admitting: *Deleted

## 2014-01-27 ENCOUNTER — Ambulatory Visit (HOSPITAL_COMMUNITY)
Admission: RE | Admit: 2014-01-27 | Discharge: 2014-01-27 | Disposition: A | Discharge status: Home / Self Care | Payer: Medicare Other | Source: Ambulatory Visit | Attending: Ophthalmology | Admitting: Ophthalmology

## 2014-01-27 ENCOUNTER — Encounter (HOSPITAL_COMMUNITY)
Admission: RE | Disposition: A | Discharge status: Home / Self Care | Payer: Self-pay | Source: Ambulatory Visit | Attending: Ophthalmology

## 2014-01-27 DIAGNOSIS — H2640 Unspecified secondary cataract: Secondary | ICD-10-CM | POA: Diagnosis present

## 2014-01-27 HISTORY — PX: YAG LASER APPLICATION: SHX6189

## 2014-01-27 SURGERY — TREATMENT, USING YAG LASER
Anesthesia: LOCAL | Laterality: Right

## 2014-01-27 MED ORDER — TROPICAMIDE 1 % OP SOLN
1.0000 [drp] | OPHTHALMIC | Status: AC
  Administered 2014-01-27 (×3): 1 [drp] via OPHTHALMIC

## 2014-01-27 MED ORDER — TROPICAMIDE 1 % OP SOLN
OPHTHALMIC | Status: AC
  Filled 2014-01-27: qty 3

## 2014-01-27 NOTE — Op Note (Signed)
Steven Stevens [name], MD  Procedure: Yag Capsulotomy  Yag Laser Self Test Completedyes. Procedure: Posterior Capsulotomy, Eye Protection Worn by Staff yes. Laser In Use Sign on Door yes.  Laser: Nd:YAG Spot Size: Fixed Burst Mode: III Power Setting: 3,5 mJ/burst Number of shots: 12 Total energy delivered: 58.0 mJ   The patient tolerated the procedure without difficulty. No complications were encountered.   The patient was discharged home with the instructions to continue all her current glaucoma medications, if any.   Patient instructed to go to office at 0200 for intraocular pressure check.  Patient verbalizes understanding of discharge instructions Yes.  .    Pre-Operative Diagnosis: After-Cataract, obscuring vision, 366.53 OD Post-Operative Diagnosis: After-Cataract, obscuring vision, 366.53 OD

## 2014-01-27 NOTE — H&P (Signed)
The patient was re examined and there is no change in the patients condition since the original H and P. 

## 2014-02-02 ENCOUNTER — Encounter (HOSPITAL_COMMUNITY): Payer: Self-pay | Admitting: Ophthalmology

## 2014-02-10 ENCOUNTER — Other Ambulatory Visit: Payer: Self-pay | Admitting: Internal Medicine

## 2014-04-30 ENCOUNTER — Encounter: Payer: Self-pay | Admitting: Internal Medicine

## 2014-04-30 DIAGNOSIS — I10 Essential (primary) hypertension: Secondary | ICD-10-CM | POA: Diagnosis not present

## 2014-04-30 DIAGNOSIS — E1165 Type 2 diabetes mellitus with hyperglycemia: Secondary | ICD-10-CM | POA: Diagnosis not present

## 2014-04-30 DIAGNOSIS — E669 Obesity, unspecified: Secondary | ICD-10-CM | POA: Diagnosis not present

## 2014-04-30 DIAGNOSIS — I2510 Atherosclerotic heart disease of native coronary artery without angina pectoris: Secondary | ICD-10-CM | POA: Diagnosis not present

## 2014-05-27 ENCOUNTER — Other Ambulatory Visit: Payer: Self-pay | Admitting: Adult Health

## 2014-05-29 DIAGNOSIS — I10 Essential (primary) hypertension: Secondary | ICD-10-CM | POA: Diagnosis not present

## 2014-05-29 DIAGNOSIS — I2510 Atherosclerotic heart disease of native coronary artery without angina pectoris: Secondary | ICD-10-CM | POA: Diagnosis not present

## 2014-05-29 DIAGNOSIS — E1165 Type 2 diabetes mellitus with hyperglycemia: Secondary | ICD-10-CM | POA: Diagnosis not present

## 2014-05-29 DIAGNOSIS — E669 Obesity, unspecified: Secondary | ICD-10-CM | POA: Diagnosis not present

## 2014-06-08 ENCOUNTER — Other Ambulatory Visit: Payer: Self-pay | Admitting: Internal Medicine

## 2014-06-23 ENCOUNTER — Other Ambulatory Visit: Payer: Self-pay | Admitting: Physician Assistant

## 2014-08-03 ENCOUNTER — Other Ambulatory Visit: Payer: Self-pay

## 2014-08-04 DIAGNOSIS — Z0000 Encounter for general adult medical examination without abnormal findings: Secondary | ICD-10-CM | POA: Diagnosis not present

## 2014-10-09 DIAGNOSIS — E785 Hyperlipidemia, unspecified: Secondary | ICD-10-CM | POA: Diagnosis not present

## 2014-10-09 DIAGNOSIS — Z125 Encounter for screening for malignant neoplasm of prostate: Secondary | ICD-10-CM | POA: Diagnosis not present

## 2014-10-09 DIAGNOSIS — E1165 Type 2 diabetes mellitus with hyperglycemia: Secondary | ICD-10-CM | POA: Diagnosis not present

## 2014-10-09 DIAGNOSIS — F329 Major depressive disorder, single episode, unspecified: Secondary | ICD-10-CM | POA: Diagnosis not present

## 2014-10-09 DIAGNOSIS — F419 Anxiety disorder, unspecified: Secondary | ICD-10-CM | POA: Diagnosis not present

## 2014-10-09 DIAGNOSIS — E669 Obesity, unspecified: Secondary | ICD-10-CM | POA: Diagnosis not present

## 2014-11-04 DIAGNOSIS — E1165 Type 2 diabetes mellitus with hyperglycemia: Secondary | ICD-10-CM | POA: Diagnosis not present

## 2014-11-04 DIAGNOSIS — I119 Hypertensive heart disease without heart failure: Secondary | ICD-10-CM | POA: Diagnosis not present

## 2014-11-04 DIAGNOSIS — I10 Essential (primary) hypertension: Secondary | ICD-10-CM | POA: Diagnosis not present

## 2014-11-04 DIAGNOSIS — F419 Anxiety disorder, unspecified: Secondary | ICD-10-CM | POA: Diagnosis not present

## 2014-12-03 DIAGNOSIS — L03031 Cellulitis of right toe: Secondary | ICD-10-CM | POA: Diagnosis not present

## 2014-12-03 DIAGNOSIS — M79671 Pain in right foot: Secondary | ICD-10-CM | POA: Diagnosis not present

## 2014-12-03 DIAGNOSIS — M79674 Pain in right toe(s): Secondary | ICD-10-CM | POA: Diagnosis not present

## 2014-12-03 DIAGNOSIS — L600 Ingrowing nail: Secondary | ICD-10-CM | POA: Diagnosis not present

## 2014-12-17 DIAGNOSIS — M79671 Pain in right foot: Secondary | ICD-10-CM | POA: Diagnosis not present

## 2014-12-17 DIAGNOSIS — M79674 Pain in right toe(s): Secondary | ICD-10-CM | POA: Diagnosis not present

## 2014-12-17 DIAGNOSIS — L600 Ingrowing nail: Secondary | ICD-10-CM | POA: Diagnosis not present

## 2014-12-17 DIAGNOSIS — L03031 Cellulitis of right toe: Secondary | ICD-10-CM | POA: Diagnosis not present

## 2015-01-18 ENCOUNTER — Other Ambulatory Visit: Payer: Self-pay | Admitting: Physician Assistant

## 2015-02-04 ENCOUNTER — Other Ambulatory Visit: Payer: Self-pay | Admitting: Physician Assistant

## 2015-02-04 ENCOUNTER — Other Ambulatory Visit: Payer: Self-pay | Admitting: Internal Medicine

## 2015-02-04 DIAGNOSIS — I10 Essential (primary) hypertension: Secondary | ICD-10-CM | POA: Diagnosis not present

## 2015-02-04 DIAGNOSIS — M545 Low back pain: Secondary | ICD-10-CM | POA: Diagnosis not present

## 2015-02-04 DIAGNOSIS — E1165 Type 2 diabetes mellitus with hyperglycemia: Secondary | ICD-10-CM | POA: Diagnosis not present

## 2015-02-04 DIAGNOSIS — I25119 Atherosclerotic heart disease of native coronary artery with unspecified angina pectoris: Secondary | ICD-10-CM | POA: Diagnosis not present

## 2015-02-23 ENCOUNTER — Other Ambulatory Visit: Payer: Self-pay | Admitting: Physician Assistant

## 2015-02-28 ENCOUNTER — Other Ambulatory Visit: Payer: Self-pay | Admitting: Physician Assistant

## 2015-03-01 ENCOUNTER — Other Ambulatory Visit: Payer: Self-pay | Admitting: *Deleted

## 2015-03-01 MED ORDER — LOSARTAN POTASSIUM-HCTZ 100-25 MG PO TABS: 1.0000 | ORAL_TABLET | Freq: Every day | ORAL | Status: DC

## 2015-03-01 NOTE — Telephone Encounter (Signed)
Patient has not been seen since 10/2013. Giving half RX.

## 2015-03-25 ENCOUNTER — Other Ambulatory Visit: Payer: Self-pay | Admitting: Adult Health

## 2015-03-25 ENCOUNTER — Ambulatory Visit (HOSPITAL_COMMUNITY)
Admission: RE | Admit: 2015-03-25 | Discharge: 2015-03-25 | Disposition: A | Discharge status: Home / Self Care | Payer: Medicare Other | Source: Ambulatory Visit | Attending: Adult Health | Admitting: Adult Health

## 2015-03-25 ENCOUNTER — Encounter: Payer: Self-pay | Admitting: Adult Health

## 2015-03-25 ENCOUNTER — Encounter: Payer: Self-pay | Admitting: *Deleted

## 2015-03-25 ENCOUNTER — Ambulatory Visit (INDEPENDENT_AMBULATORY_CARE_PROVIDER_SITE_OTHER): Payer: Medicare Other | Admitting: Adult Health

## 2015-03-25 ENCOUNTER — Other Ambulatory Visit: Payer: Self-pay | Admitting: Cardiology

## 2015-03-25 VITALS — BP 142/82 | HR 76 | Ht 68.5 in | Wt 312.0 lb

## 2015-03-25 DIAGNOSIS — I10 Essential (primary) hypertension: Secondary | ICD-10-CM

## 2015-03-25 DIAGNOSIS — Z01818 Encounter for other preprocedural examination: Secondary | ICD-10-CM | POA: Insufficient documentation

## 2015-03-25 DIAGNOSIS — I25119 Atherosclerotic heart disease of native coronary artery with unspecified angina pectoris: Secondary | ICD-10-CM | POA: Diagnosis not present

## 2015-03-25 DIAGNOSIS — I2510 Atherosclerotic heart disease of native coronary artery without angina pectoris: Secondary | ICD-10-CM | POA: Diagnosis not present

## 2015-03-25 DIAGNOSIS — M545 Low back pain: Secondary | ICD-10-CM | POA: Diagnosis not present

## 2015-03-25 DIAGNOSIS — R0789 Other chest pain: Secondary | ICD-10-CM | POA: Insufficient documentation

## 2015-03-25 DIAGNOSIS — Z01810 Encounter for preprocedural cardiovascular examination: Secondary | ICD-10-CM

## 2015-03-25 DIAGNOSIS — Z634 Disappearance and death of family member: Secondary | ICD-10-CM | POA: Diagnosis not present

## 2015-03-25 DIAGNOSIS — E1165 Type 2 diabetes mellitus with hyperglycemia: Secondary | ICD-10-CM | POA: Diagnosis not present

## 2015-03-25 DIAGNOSIS — R0602 Shortness of breath: Secondary | ICD-10-CM | POA: Insufficient documentation

## 2015-03-25 NOTE — Progress Notes (Signed)
Cardiology Office Note   Date:  03/25/2015   ID:  Steven Stevens, DOB [birth year], MRN [id number]  PCP:  Alonza Bogus, MD  Cardiologist: Ross/  Jory Sims, NP   No chief complaint on file.     History of Present Illness: Steven Stevens is a 70 y.o. male who presents for ongoing assessment and management of CAD, with history of drug-eluting stent to the mid and distal RCA in 2005, chronic occlusion of the LAD with distal collateralization.  An EF of 55% per cath in 2005.  As recent exercise stress test revealed a defect consistent with scar with some evidence of ischemia consistent with previous MI.  Other history includes hypertension, hyperlipidemia, and diabetes.he was last seen in the office in September of 2015 and was stable from a cardiovascular standpoint.  He was continued on all of his medications.  In a with his daughter with complaints of recurrent chest pain.  The patient states he has been having progressive chest tightness and pressure, non-radiating with mild dyspnea associated.  Pain goes away with rest.also, the patient's wife died approximately 10 weeks ago suddenly.  He at first developed a recurrent chest pain was related to the stress of her death, but he continues to have this has become concerned.  His daughter works for the [employer] at  [employer], and accompanies him today.  Past Medical History  Diagnosis Date  . Hypertension   . High cholesterol   . Diabetes mellitus   . Myocardial infarct [facility]) 2005    Past Surgical History  Procedure Laterality Date  . Hernia repair    . Back surgery    . Colonoscopy    . Coronary angioplasty with stent placement  2005  . Cataract extraction w/phaco Right 12/03/2012    Procedure: CATARACT EXTRACTION RIGHT EYE (WITH PHACO) AND INTRAOCULAR LENS PLACEMENT  CDE=2.41;  Surgeon: Elta Guadeloupe [name], MD;  Location: [facility];  Service: Ophthalmology;  Laterality: Right;  . Cataract extraction w/phaco Left 12/17/2012   Procedure: CATARACT EXTRACTION PHACO AND INTRAOCULAR LENS PLACEMENT (IOC);  Surgeon: Elta Guadeloupe [name], MD;  Location: [facility];  Service: Ophthalmology;  Laterality: Left;  CDE:7.82  . Yag laser application Right [id number]    Procedure: YAG LASER APPLICATION;  Surgeon: Elta Guadeloupe [name], MD;  Location: [facility];  Service: Ophthalmology;  Laterality: Right;     Current Outpatient Prescriptions  Medication Sig Dispense Refill  . ALPRAZolam (XANAX) 1 MG tablet Take 1 mg by mouth 4 (four) times daily as needed for anxiety.     Marland Kitchen aspirin 325 MG tablet Take 325 mg by mouth daily.    Marland Kitchen atorvastatin (LIPITOR) 40 MG tablet TAKE 1 TABLET BY MOUTH EVERY DAY 30 tablet 0  . clopidogrel (PLAVIX) 75 MG tablet Take 75 mg by mouth daily.    Marland Kitchen glipiZIDE-metformin (METAGLIP) 5-500 MG per tablet Take 1 tablet by mouth 2 (two) times daily before a meal.     . HYDROcodone-acetaminophen (LORTAB) 10-500 MG per tablet Take 1 tablet by mouth every 6 (six) hours as needed for pain.     Marland Kitchen losartan-hydrochlorothiazide (HYZAAR) 100-25 MG tablet Take 1 tablet by mouth daily. 15 tablet 0  . metoprolol tartrate (LOPRESSOR) 25 MG tablet Take 12.5 mg by mouth 2 (two) times daily.    . nitroGLYCERIN (NITROSTAT) 0.4 MG SL tablet Place 1 tablet (0.4 mg total) under the tongue every 5 (five) minutes as needed for chest pain. 25 tablet 4  . ONE TOUCH ULTRA TEST  test strip     . sertraline (ZOLOFT) 50 MG tablet Take 50 mg by mouth daily.     Marland Kitchen terazosin (HYTRIN) 5 MG capsule Take 5 mg by mouth at bedtime.     No current facility-administered medications for this visit.    Allergies:   Review of patient's allergies indicates no known allergies.    Social History:  The patient  reports that he quit smoking about 11 years ago. His smoking use included Cigarettes. He smoked 1.00 pack per day. He has never used smokeless tobacco. He reports that he does not drink alcohol or use illicit drugs.   Family History:  The patient's family  history is not on file.    ROS: All other systems are reviewed and negative. Unless otherwise mentioned in H&P    PHYSICAL EXAM: VS:  BP 142/82 mmHg  Pulse 76  Ht 5' 8.5" (1.74 m)  Wt 312 lb (141.522 kg)  BMI 46.74 kg/m2  SpO2 94% , BMI Body mass index is 46.74 kg/(m^2). GEN: Well nourished, well developed, in no acute distressmorbidly obese. HEENT: normal Neck: no JVD, carotid bruits, or masses Cardiac: RRR, distant heart sounds; no murmurs, rubs, or gallops,no edema  Respiratory:  clear to auscultation bilaterally, normal work of breathing GI: soft, nontender, nondistended, + BS MS: no deformity or atrophy Skin: warm and dry, no rash Neuro:  Strength and sensation are intact Psych: euthymic mood, full affect   EKG:  The ekg ordered today demonstrates normal sinus rhythm, rate of 75 beats per minute, with inferior Q waves.   Recent Labs: No results found for requested labs within last 365 days.    Lipid Panel    Component Value Date/Time   CHOL 162 12/26/2012 0826   TRIG 199* 12/26/2012 0826   HDL 35* 12/26/2012 0826   CHOLHDL 4.6 12/26/2012 0826   VLDL 40 12/26/2012 0826   LDLCALC 87 12/26/2012 0826      Wt Readings from Last 3 Encounters:  03/25/15 312 lb (141.522 kg)  10/29/13 313 lb (141.976 kg)  05/09/13 319 lb (144.697 kg)      Other studies Reviewed: Additional studies/ records that were reviewed today include: cardiac catheterization report from 06/22/2003  Review of the above records demonstrates: LAD with a 40% eccentric stenosis at the diagonal and before the septal, after the second diagonal and septal the vessel was totally occluded.  The second diagonal has 50% segmental narrowing.  The right coronary artery was fairly smooth down the side of the total occlusion and then totally occluded.  The distal vessel severely diseased its point of the PDA.  The entire area was stented using a Cypher stent.   ASSESSMENT AND PLAN:  1. CAD: history of  drug-eluting stent to the right coronary artery with disease noted in the LAD, and first and second diagonal per cardiac catheterization in 2005.  The patient has continued cardiovascular risk factors include diabetes, hypertension, obesity.  He continues on aspirin, metoprolol, and Plavix.  He also continues on statin therapy.    Due to recurrent chest pain, which has become progressive with exertion, plus situational , with the occurrence of his wife's recent death, I discussed with Dr. Harl Bowie, the need to proceed with cardiac catheterization. After reviewing the patient's case, he is in agreement to proceed with cardiac catheterization.  I discussed with the patient the risks and benefits of the procedure, along with description of the procedure, which is significantly different than in 2005.  Both he  and his daughter verbalized understanding and are willing to proceed. I have written a new prescription for nitroglycerin as he does not have any  Fresh bottles.  Cardiac catheterization has been scheduled for 03/30/2015 at 10 AM with Dr.Varanosi.  Orders are written.  2. Hypertension:blood pressure is currently slightly elevated for someone with diabetes.  However, he is anxious, and continues to grieve over his wife as we discussed.  It.  I will continue him on losartan and metoprolol.  3. Hypercholesterolemia: He will continue on statin therapy.   Current medicines are reviewed at length with the patient today.    Labs/ tests ordered today include: Pre-cardiac cath with pre-procedure labs and X-rays.   Orders Placed This Encounter  Procedures  . DG Chest 2 View  . EKG 12-Lead     Disposition:   FU with Dr. Harl Bowie or myself post procedure.  Signed, Jory Sims, NP  03/25/2015 4:33 PM    [facility] [address], [place], [place] [address] Phone: [phone]; Fax: [phone]

## 2015-03-25 NOTE — Progress Notes (Signed)
Name: Steven Stevens    DOB: [birth year]  Age: 70 y.o.  MR#: [id number]       PCP:  Alonza Bogus, MD      Insurance: Payor: [employer] MEDICARE / Plan: [facility] MEDICARE / Product Type: *No Product type* /   CC:   No chief complaint on file.   VS Filed Vitals:   03/25/15 1536  BP: 142/82  Pulse: 76  Height: 5' 8.5" (1.74 m)  Weight: 312 lb (141.522 kg)  SpO2: 94%    Weights Current Weight  03/25/15 312 lb (141.522 kg)  10/29/13 313 lb (141.976 kg)  05/09/13 319 lb (144.697 kg)    Blood Pressure  BP Readings from Last 3 Encounters:  03/25/15 142/82  01/27/14 159/57  10/29/13 102/62     Admit date:  (Not on file) Last encounter with RMR:  Visit date not found   Allergy Review of patient's allergies indicates no known allergies.  Current Outpatient Prescriptions  Medication Sig Dispense Refill  . ALPRAZolam (XANAX) 1 MG tablet Take 1 mg by mouth 4 (four) times daily as needed for anxiety.     Marland Kitchen aspirin 325 MG tablet Take 325 mg by mouth daily.    Marland Kitchen atorvastatin (LIPITOR) 40 MG tablet TAKE 1 TABLET BY MOUTH EVERY DAY 30 tablet 0  . clopidogrel (PLAVIX) 75 MG tablet Take 75 mg by mouth daily.    Marland Kitchen glipiZIDE-metformin (METAGLIP) 5-500 MG per tablet Take 1 tablet by mouth 2 (two) times daily before a meal.     . HYDROcodone-acetaminophen (LORTAB) 10-500 MG per tablet Take 1 tablet by mouth every 6 (six) hours as needed for pain.     Marland Kitchen losartan-hydrochlorothiazide (HYZAAR) 100-25 MG tablet Take 1 tablet by mouth daily. 15 tablet 0  . metoprolol tartrate (LOPRESSOR) 25 MG tablet Take 12.5 mg by mouth 2 (two) times daily.    . nitroGLYCERIN (NITROSTAT) 0.4 MG SL tablet Place 1 tablet (0.4 mg total) under the tongue every 5 (five) minutes as needed for chest pain. 25 tablet 4  . ONE TOUCH ULTRA TEST test strip     . sertraline (ZOLOFT) 50 MG tablet Take 50 mg by mouth daily.     Marland Kitchen terazosin (HYTRIN) 5 MG capsule Take 5 mg by mouth at bedtime.     No current  facility-administered medications for this visit.    Discontinued Meds:   There are no discontinued medications.  Patient Active Problem List   Diagnosis Date Noted  . Essential hypertension 10/29/2013  . Obesity, morbid, BMI 40.0-49.9 ([place]) 10/29/2013  . Other and unspecified hyperlipidemia 05/31/2012  . CAD (coronary artery disease) 05/31/2012    LABS    Component Value Date/Time   NA 142 12/26/2012 0831   NA 138 11/26/2012 0900   NA 141 06/18/2012 1130   K 4.4 12/26/2012 0831   K 4.3 11/26/2012 0900   K 4.5 06/18/2012 1130   CL 103 12/26/2012 0831   CL 99 11/26/2012 0900   CL 103 06/18/2012 1130   CO2 28 12/26/2012 0831   CO2 31 11/26/2012 0900   CO2 31 06/18/2012 1130   GLUCOSE 133* 12/26/2012 0831   GLUCOSE 113* 11/26/2012 0900   GLUCOSE 102* 06/18/2012 1130   BUN 15 12/26/2012 0831   BUN 17 11/26/2012 0900   BUN 16 06/18/2012 1130   CREATININE 1.25 12/26/2012 0831   CREATININE 1.27 11/26/2012 0900   CREATININE 1.20 06/18/2012 1130   CREATININE 1.19 02/04/2012 0650   CREATININE 1.14  12/17/2009 1741   CALCIUM 9.2 12/26/2012 0831   CALCIUM 9.6 11/26/2012 0900   CALCIUM 9.2 06/18/2012 1130   GFRNONAA 57* 11/26/2012 0900   GFRNONAA 62* 02/04/2012 0650   GFRNONAA >60 12/17/2009 1741   GFRAA 66* 11/26/2012 0900   GFRAA 72* 02/04/2012 0650   GFRAA  12/17/2009 1741    >60        The eGFR has been calculated using the MDRD equation. This calculation has not been validated in all clinical situations. eGFR's persistently <60 mL/min signify possible Chronic Kidney Disease.   CMP     Component Value Date/Time   NA 142 12/26/2012 0831   K 4.4 12/26/2012 0831   CL 103 12/26/2012 0831   CO2 28 12/26/2012 0831   GLUCOSE 133* 12/26/2012 0831   BUN 15 12/26/2012 0831   CREATININE 1.25 12/26/2012 0831   CREATININE 1.27 11/26/2012 0900   CALCIUM 9.2 12/26/2012 0831   PROT 7.0 05/21/2008 1316   ALBUMIN 3.9 05/21/2008 1316   AST 19 12/26/2012 0829   ALT 26  05/21/2008 1316   ALKPHOS 38* 05/21/2008 1316   BILITOT 0.7 05/21/2008 1316   GFRNONAA 57* 11/26/2012 0900   GFRAA 66* 11/26/2012 0900       Component Value Date/Time   WBC 6.6 11/26/2012 0930   WBC 9.5 02/04/2012 0650   WBC 8.7 12/17/2009 1741   HGB 13.0 11/26/2012 0930   HGB 12.9* 02/04/2012 0650   HGB 13.4 12/17/2009 1741   HCT 38.6* 11/26/2012 0930   HCT 38.3* 02/04/2012 0650   HCT 39.7 12/17/2009 1741   MCV 89.4 11/26/2012 0930   MCV 89.5 02/04/2012 0650   MCV 89.1 12/17/2009 1741    Lipid Panel     Component Value Date/Time   CHOL 162 12/26/2012 0826   TRIG 199* 12/26/2012 0826   HDL 35* 12/26/2012 0826   CHOLHDL 4.6 12/26/2012 0826   VLDL 40 12/26/2012 0826   LDLCALC 87 12/26/2012 0826    ABG No results found for: PHART, PCO2ART, PO2ART, HCO3, TCO2, ACIDBASEDEF, O2SAT   No results found for: TSH BNP (last 3 results) No results for input(s): BNP in the last 8760 hours.  ProBNP (last 3 results) No results for input(s): PROBNP in the last 8760 hours.  Cardiac Panel (last 3 results) No results for input(s): CKTOTAL, CKMB, TROPONINI, RELINDX in the last 72 hours.  Iron/TIBC/Ferritin/ %Sat No results found for: IRON, TIBC, FERRITIN, IRONPCTSAT   EKG Orders placed or performed in visit on 03/25/15  . EKG 12-Lead     Prior Assessment and Plan Problem List as of 03/25/2015      Cardiovascular and Mediastinum   CAD (coronary artery disease)   Last Assessment & Plan 10/29/2013 Office Visit Written 10/29/2013  2:10 PM by Imogene Burn, PA-C    Stable without chest pain. Continue Plavix, aspirin and metoprolol.      Essential hypertension   Last Assessment & Plan 10/29/2013 Office Visit Written 10/29/2013  2:12 PM by Imogene Burn, PA-C    Controlled        Other   Other and unspecified hyperlipidemia   Last Assessment & Plan 10/29/2013 Office Visit Written 10/29/2013  2:13 PM by Imogene Burn, PA-C    Treated by Dr. Luan Pulling      Obesity, morbid, BMI  40.0-49.9 [facility])   Last Assessment & Plan 10/29/2013 Office Visit Written 10/29/2013  2:14 PM by Imogene Burn, PA-C    I had along discussion with the  patient concerning his weight gain. Discussed a weight loss program and exercise program. He is willing to try and says he'll be 25 pounds less on his next office visit.          Imaging: No results found.

## 2015-03-25 NOTE — Patient Instructions (Addendum)
Your physician has requested that you have a cardiac catheterization. Cardiac catheterization is used to diagnose and/or treat various heart conditions. Doctors may recommend this procedure for a number of different reasons. The most common reason is to evaluate chest pain. Chest pain can be a symptom of coronary artery disease (CAD), and cardiac catheterization can show whether plaque is narrowing or blocking your heart's arteries. This procedure is also used to evaluate the valves, as well as measure the blood flow and oxygen levels in different parts of your heart. For further information please visit [email or web address]. Please follow instruction sheet, as given.  Have Chest X-Ray done today  Your physician recommends that you continue on your current medications as directed. Please refer to the Current Medication list given to you today.  If you need a refill on your cardiac medications before your next appointment, please call your pharmacy.  Thank you for choosing [facility]!

## 2015-03-30 ENCOUNTER — Encounter (HOSPITAL_COMMUNITY)
Admission: RE | Disposition: A | Discharge status: Home / Self Care | Payer: Self-pay | Source: Ambulatory Visit | Attending: Interventional Cardiology

## 2015-03-30 ENCOUNTER — Ambulatory Visit (HOSPITAL_COMMUNITY)
Admission: RE | Admit: 2015-03-30 | Discharge: 2015-03-31 | Disposition: A | Discharge status: Home / Self Care | Payer: Medicare Other | Source: Ambulatory Visit | Attending: Interventional Cardiology | Admitting: Interventional Cardiology

## 2015-03-30 ENCOUNTER — Encounter (HOSPITAL_COMMUNITY): Payer: Self-pay | Admitting: Interventional Cardiology

## 2015-03-30 DIAGNOSIS — I10 Essential (primary) hypertension: Secondary | ICD-10-CM | POA: Diagnosis not present

## 2015-03-30 DIAGNOSIS — Z7982 Long term (current) use of aspirin: Secondary | ICD-10-CM | POA: Insufficient documentation

## 2015-03-30 DIAGNOSIS — I2510 Atherosclerotic heart disease of native coronary artery without angina pectoris: Secondary | ICD-10-CM | POA: Diagnosis present

## 2015-03-30 DIAGNOSIS — I252 Old myocardial infarction: Secondary | ICD-10-CM | POA: Insufficient documentation

## 2015-03-30 DIAGNOSIS — Z955 Presence of coronary angioplasty implant and graft: Secondary | ICD-10-CM | POA: Insufficient documentation

## 2015-03-30 DIAGNOSIS — E119 Type 2 diabetes mellitus without complications: Secondary | ICD-10-CM | POA: Insufficient documentation

## 2015-03-30 DIAGNOSIS — Z6841 Body Mass Index (BMI) 40.0 and over, adult: Secondary | ICD-10-CM | POA: Diagnosis not present

## 2015-03-30 DIAGNOSIS — Z87891 Personal history of nicotine dependence: Secondary | ICD-10-CM | POA: Insufficient documentation

## 2015-03-30 DIAGNOSIS — I25119 Atherosclerotic heart disease of native coronary artery with unspecified angina pectoris: Secondary | ICD-10-CM | POA: Insufficient documentation

## 2015-03-30 DIAGNOSIS — Z01812 Encounter for preprocedural laboratory examination: Secondary | ICD-10-CM | POA: Diagnosis not present

## 2015-03-30 DIAGNOSIS — E785 Hyperlipidemia, unspecified: Secondary | ICD-10-CM | POA: Insufficient documentation

## 2015-03-30 DIAGNOSIS — Z7902 Long term (current) use of antithrombotics/antiplatelets: Secondary | ICD-10-CM | POA: Diagnosis not present

## 2015-03-30 DIAGNOSIS — E7800 Pure hypercholesterolemia, unspecified: Secondary | ICD-10-CM | POA: Insufficient documentation

## 2015-03-30 DIAGNOSIS — Z01810 Encounter for preprocedural cardiovascular examination: Secondary | ICD-10-CM | POA: Diagnosis not present

## 2015-03-30 DIAGNOSIS — Z9861 Coronary angioplasty status: Secondary | ICD-10-CM

## 2015-03-30 HISTORY — DX: Atherosclerotic heart disease of native coronary artery without angina pectoris: I25.10

## 2015-03-30 HISTORY — PX: CORONARY STENT PLACEMENT: SHX1402

## 2015-03-30 HISTORY — PX: CARDIAC CATHETERIZATION: SHX172

## 2015-03-30 LAB — CBC
HCT: 35.2 % — ABNORMAL LOW (ref 39.0–52.0)
HEMOGLOBIN: 11.6 g/dL — AB (ref 13.0–17.0)
MCH: 29.9 pg (ref 26.0–34.0)
MCHC: 33 g/dL (ref 30.0–36.0)
MCV: 90.7 fL (ref 78.0–100.0)
Platelets: 243 10*3/uL (ref 150–400)
RBC: 3.88 MIL/uL — AB (ref 4.22–5.81)
RDW: 14.6 % (ref 11.5–15.5)
WBC: 7.8 10*3/uL (ref 4.0–10.5)

## 2015-03-30 LAB — CBC WITH DIFFERENTIAL/PLATELET
BASOS PCT: 0 % (ref 0–1)
Basophils Absolute: 0 10*3/uL (ref 0.0–0.1)
EOS ABS: 0.2 10*3/uL (ref 0.0–0.7)
Eosinophils Relative: 3 % (ref 0–5)
HCT: 36.8 % — ABNORMAL LOW (ref 39.0–52.0)
Hemoglobin: 12.1 g/dL — ABNORMAL LOW (ref 13.0–17.0)
Lymphocytes Relative: 26 % (ref 12–46)
Lymphs Abs: 2 10*3/uL (ref 0.7–4.0)
MCH: 29.3 pg (ref 26.0–34.0)
MCHC: 32.9 g/dL (ref 30.0–36.0)
MCV: 89.1 fL (ref 78.0–100.0)
MONOS PCT: 8 % (ref 3–12)
MPV: 8.4 fL — AB (ref 8.6–12.4)
Monocytes Absolute: 0.6 10*3/uL (ref 0.1–1.0)
NEUTROS PCT: 63 % (ref 43–77)
Neutro Abs: 4.9 10*3/uL (ref 1.7–7.7)
Platelets: 277 10*3/uL (ref 150–400)
RBC: 4.13 MIL/uL — AB (ref 4.22–5.81)
RDW: 15 % (ref 11.5–15.5)
WBC: 7.7 10*3/uL (ref 4.0–10.5)

## 2015-03-30 LAB — BASIC METABOLIC PANEL
ANION GAP: 12 (ref 5–15)
BUN: 14 mg/dL (ref 6–20)
CALCIUM: 8.7 mg/dL — AB (ref 8.9–10.3)
CO2: 26 mmol/L (ref 22–32)
Chloride: 102 mmol/L (ref 101–111)
Creatinine, Ser: 1.06 mg/dL (ref 0.61–1.24)
Glucose, Bld: 144 mg/dL — ABNORMAL HIGH (ref 65–99)
Potassium: 3.7 mmol/L (ref 3.5–5.1)
Sodium: 140 mmol/L (ref 135–145)

## 2015-03-30 LAB — GLUCOSE, CAPILLARY
GLUCOSE-CAPILLARY: 116 mg/dL — AB (ref 65–99)
GLUCOSE-CAPILLARY: 110 mg/dL — AB (ref 65–99)
Glucose-Capillary: 118 mg/dL — ABNORMAL HIGH (ref 65–99)

## 2015-03-30 LAB — PROTIME-INR
INR: 1.02 (ref <1.50)
INR: 1.19 (ref 0.00–1.49)
PROTHROMBIN TIME: 13.5 s (ref 11.6–15.2)
PROTHROMBIN TIME: 15.3 s — AB (ref 11.6–15.2)

## 2015-03-30 LAB — POCT ACTIVATED CLOTTING TIME
ACTIVATED CLOTTING TIME: 270 s
Activated Clotting Time: 250 seconds

## 2015-03-30 SURGERY — LEFT HEART CATH AND CORONARY ANGIOGRAPHY

## 2015-03-30 MED ORDER — HEPARIN SODIUM (PORCINE) 1000 UNIT/ML IJ SOLN
INTRAMUSCULAR | Status: AC
  Filled 2015-03-30: qty 1

## 2015-03-30 MED ORDER — SODIUM CHLORIDE 0.9% FLUSH: 3.0000 mL | INTRAVENOUS | Status: DC | PRN

## 2015-03-30 MED ORDER — SODIUM CHLORIDE 0.9 % WEIGHT BASED INFUSION: 1.0000 mL/kg/h | INTRAVENOUS | Status: DC

## 2015-03-30 MED ORDER — HEPARIN (PORCINE) IN NACL 2-0.9 UNIT/ML-% IJ SOLN
INTRAMUSCULAR | Status: AC
  Filled 2015-03-30: qty 1000

## 2015-03-30 MED ORDER — ASPIRIN 81 MG PO CHEW: 81.0000 mg | CHEWABLE_TABLET | ORAL | Status: DC

## 2015-03-30 MED ORDER — NITROGLYCERIN 1 MG/10 ML FOR IR/CATH LAB
INTRA_ARTERIAL | Status: AC
  Filled 2015-03-30: qty 10

## 2015-03-30 MED ORDER — SERTRALINE HCL 50 MG PO TABS
50.0000 mg | ORAL_TABLET | Freq: Every day | ORAL | Status: DC
  Administered 2015-03-31: 09:00:00 50 mg via ORAL
  Filled 2015-03-30: qty 1

## 2015-03-30 MED ORDER — FENTANYL CITRATE (PF) 100 MCG/2ML IJ SOLN
INTRAMUSCULAR | Status: AC
  Filled 2015-03-30: qty 2

## 2015-03-30 MED ORDER — IOHEXOL 350 MG/ML SOLN
INTRAVENOUS | Status: DC | PRN
  Administered 2015-03-30: 160 mL via INTRA_ARTERIAL

## 2015-03-30 MED ORDER — SODIUM CHLORIDE 0.9 % WEIGHT BASED INFUSION
3.0000 mL/kg/h | INTRAVENOUS | Status: DC
  Administered 2015-03-30: 3 mL/kg/h via INTRAVENOUS

## 2015-03-30 MED ORDER — HEPARIN (PORCINE) IN NACL 2-0.9 UNIT/ML-% IJ SOLN
INTRAMUSCULAR | Status: DC | PRN
Start: 2015-03-30 — End: 2015-03-30
  Administered 2015-03-30: 1000 mL via INTRA_ARTERIAL

## 2015-03-30 MED ORDER — MIDAZOLAM HCL 2 MG/2ML IJ SOLN
INTRAMUSCULAR | Status: DC | PRN
  Administered 2015-03-30: 2 mg via INTRAVENOUS

## 2015-03-30 MED ORDER — NITROGLYCERIN 0.4 MG SL SUBL: 0.4000 mg | SUBLINGUAL_TABLET | SUBLINGUAL | Status: DC | PRN

## 2015-03-30 MED ORDER — NITROGLYCERIN 1 MG/10 ML FOR IR/CATH LAB
INTRA_ARTERIAL | Status: DC | PRN
  Administered 2015-03-30 (×2): 200 ug via INTRACORONARY

## 2015-03-30 MED ORDER — GLIPIZIDE 5 MG PO TABS
5.0000 mg | ORAL_TABLET | Freq: Two times a day (BID) | ORAL | Status: DC
  Administered 2015-03-30 – 2015-03-31 (×2): 5 mg via ORAL
  Filled 2015-03-30 (×2): qty 1

## 2015-03-30 MED ORDER — SODIUM CHLORIDE 0.9 % IV SOLN: 250.0000 mL | INTRAVENOUS | Status: DC | PRN

## 2015-03-30 MED ORDER — ATORVASTATIN CALCIUM 40 MG PO TABS
40.0000 mg | ORAL_TABLET | Freq: Every day | ORAL | Status: DC
  Administered 2015-03-30: 18:00:00 40 mg via ORAL
  Filled 2015-03-30: qty 1

## 2015-03-30 MED ORDER — SODIUM CHLORIDE 0.9% FLUSH: 3.0000 mL | Freq: Two times a day (BID) | INTRAVENOUS | Status: DC

## 2015-03-30 MED ORDER — VERAPAMIL HCL 2.5 MG/ML IV SOLN
INTRAVENOUS | Status: DC | PRN
  Administered 2015-03-30: 11:00:00 via INTRA_ARTERIAL

## 2015-03-30 MED ORDER — SODIUM CHLORIDE 0.9% FLUSH
3.0000 mL | Freq: Two times a day (BID) | INTRAVENOUS | Status: DC
  Administered 2015-03-30: 17:00:00 3 mL via INTRAVENOUS

## 2015-03-30 MED ORDER — CLOPIDOGREL BISULFATE 300 MG PO TABS
ORAL_TABLET | ORAL | Status: AC
  Filled 2015-03-30: qty 1

## 2015-03-30 MED ORDER — ASPIRIN 81 MG PO CHEW
81.0000 mg | CHEWABLE_TABLET | Freq: Every day | ORAL | Status: DC
  Administered 2015-03-31: 09:00:00 81 mg via ORAL
  Filled 2015-03-30: qty 1

## 2015-03-30 MED ORDER — ASPIRIN 81 MG PO CHEW
CHEWABLE_TABLET | ORAL | Status: AC
Start: 2015-03-30 — End: 2015-03-30
  Administered 2015-03-30: 81 mg
  Filled 2015-03-30: qty 1

## 2015-03-30 MED ORDER — TERAZOSIN HCL 5 MG PO CAPS
5.0000 mg | ORAL_CAPSULE | Freq: Every day | ORAL | Status: DC
  Administered 2015-03-30: 21:00:00 5 mg via ORAL
  Filled 2015-03-30: qty 1

## 2015-03-30 MED ORDER — CLOPIDOGREL BISULFATE 75 MG PO TABS: 75.0000 mg | ORAL_TABLET | Freq: Every day | ORAL | Status: DC

## 2015-03-30 MED ORDER — HEPARIN SODIUM (PORCINE) 1000 UNIT/ML IJ SOLN
INTRAMUSCULAR | Status: DC | PRN
  Administered 2015-03-30 (×2): 6000 [IU] via INTRAVENOUS
  Administered 2015-03-30: 2000 [IU] via INTRAVENOUS

## 2015-03-30 MED ORDER — ACETAMINOPHEN 325 MG PO TABS: 650.0000 mg | ORAL_TABLET | ORAL | Status: DC | PRN

## 2015-03-30 MED ORDER — MIDAZOLAM HCL 2 MG/2ML IJ SOLN
INTRAMUSCULAR | Status: AC
  Filled 2015-03-30: qty 2

## 2015-03-30 MED ORDER — VERAPAMIL HCL 2.5 MG/ML IV SOLN
INTRAVENOUS | Status: AC
  Filled 2015-03-30: qty 2

## 2015-03-30 MED ORDER — ANGIOPLASTY BOOK
Freq: Once | Status: AC
  Administered 2015-03-30: 16:00:00
  Filled 2015-03-30: qty 1

## 2015-03-30 MED ORDER — ONDANSETRON HCL 4 MG/2ML IJ SOLN
4.0000 mg | Freq: Four times a day (QID) | INTRAMUSCULAR | Status: DC | PRN
Start: 2015-03-30 — End: 2015-03-31

## 2015-03-30 MED ORDER — CLOPIDOGREL BISULFATE 300 MG PO TABS
ORAL_TABLET | ORAL | Status: DC | PRN
  Administered 2015-03-30: 300 mg via ORAL

## 2015-03-30 MED ORDER — METOPROLOL TARTRATE 12.5 MG HALF TABLET
12.5000 mg | ORAL_TABLET | Freq: Two times a day (BID) | ORAL | Status: DC
  Administered 2015-03-30 – 2015-03-31 (×2): 12.5 mg via ORAL
  Filled 2015-03-30 (×2): qty 1

## 2015-03-30 MED ORDER — HYDROCODONE-ACETAMINOPHEN 5-325 MG PO TABS: 1.0000 | ORAL_TABLET | ORAL | Status: DC | PRN

## 2015-03-30 MED ORDER — LIDOCAINE HCL (PF) 1 % IJ SOLN
INTRAMUSCULAR | Status: DC | PRN
  Administered 2015-03-30: 5 mL via INTRADERMAL

## 2015-03-30 MED ORDER — CLOPIDOGREL BISULFATE 75 MG PO TABS
75.0000 mg | ORAL_TABLET | Freq: Every day | ORAL | Status: DC
  Administered 2015-03-31: 75 mg via ORAL
  Filled 2015-03-30: qty 1

## 2015-03-30 MED ORDER — ALPRAZOLAM 0.5 MG PO TABS
1.0000 mg | ORAL_TABLET | Freq: Four times a day (QID) | ORAL | Status: DC | PRN
  Administered 2015-03-30: 1 mg via ORAL
  Filled 2015-03-30: qty 2

## 2015-03-30 MED ORDER — LIDOCAINE HCL (PF) 1 % IJ SOLN
INTRAMUSCULAR | Status: AC
  Filled 2015-03-30: qty 30

## 2015-03-30 MED ORDER — FENTANYL CITRATE (PF) 100 MCG/2ML IJ SOLN
INTRAMUSCULAR | Status: DC | PRN
  Administered 2015-03-30: 25 ug via INTRAVENOUS

## 2015-03-30 MED ORDER — SODIUM CHLORIDE 0.9 % WEIGHT BASED INFUSION
1.0000 mL/kg/h | INTRAVENOUS | Status: AC
  Administered 2015-03-30: 1 mL/kg/h via INTRAVENOUS

## 2015-03-30 SURGICAL SUPPLY — 20 items
BALLN EMERGE MR 2.0X20 (BALLOONS) ×4
BALLN ~~LOC~~ EMERGE MR 2.5X20 (BALLOONS) ×4
BALLOON EMERGE MR 2.0X20 (BALLOONS) IMPLANT
BALLOON ~~LOC~~ EMERGE MR 2.5X20 (BALLOONS) IMPLANT
CATH INFINITI 5 FR JL3.5 (CATHETERS) ×2 IMPLANT
CATH INFINITI 5FR ANG PIGTAIL (CATHETERS) ×2 IMPLANT
CATH INFINITI JR4 5F (CATHETERS) ×2 IMPLANT
DEVICE RAD COMP TR BAND LRG (VASCULAR PRODUCTS) ×4 IMPLANT
GLIDESHEATH SLEND SS 6F .021 (SHEATH) ×4 IMPLANT
GUIDE CATH RUNWAY 6FR FR4 (CATHETERS) ×2 IMPLANT
KIT ENCORE 26 ADVANTAGE (KITS) ×2 IMPLANT
KIT HEART LEFT (KITS) ×4 IMPLANT
PACK CARDIAC CATHETERIZATION (CUSTOM PROCEDURE TRAY) ×4 IMPLANT
STENT PROMUS PREM MR 2.25X28 (Permanent Stent) ×2 IMPLANT
SYR MEDRAD MARK V 150ML (SYRINGE) ×4 IMPLANT
TRANSDUCER W/STOPCOCK (MISCELLANEOUS) ×4 IMPLANT
TUBING CIL FLEX 10 FLL-RA (TUBING) ×4 IMPLANT
VALVE GUARDIAN II ~~LOC~~ HEMO (MISCELLANEOUS) ×2 IMPLANT
WIRE ASAHI PROWATER 180CM (WIRE) ×2 IMPLANT
WIRE SAFE-T 1.5MM-J .035X260CM (WIRE) ×4 IMPLANT

## 2015-03-30 NOTE — Interval H&P Note (Signed)
Cath Lab Visit (complete for each Cath Lab visit)  Clinical Evaluation Leading to the Procedure:   ACS: Yes.    Non-ACS:    Anginal Classification: CCS III  Anti-ischemic medical therapy: Minimal Therapy (1 class of medications)  Non-Invasive Test Results: No non-invasive testing performed  Prior CABG: No previous CABG      History and Physical Interval Note:  03/30/2015 11:18 AM  Steven Stevens  has presented today for surgery, with the diagnosis of cad  The various methods of treatment have been discussed with the patient and family. After consideration of risks, benefits and other options for treatment, the patient has consented to  Procedure(s): Left Heart Cath and Coronary Angiography (N/A) as a surgical intervention .  The patient's history has been reviewed, patient examined, no change in status, stable for surgery.  I have reviewed the patient's chart and labs.  Questions were answered to the patient's satisfaction.     VARANASI,JAYADEEP S.

## 2015-03-30 NOTE — Care Management Note (Signed)
Case Management Note  Patient Details  Name: VIDUR ARCOS MRN: [id number] Date of Birth: 1946/02/02  Subjective/Objective:    Patient is from home, he is on plavix.  NCM will continue to follow for dc needs.                Action/Plan:   Expected Discharge Date:                  Expected Discharge Plan:  Home/Self Care  In-House Referral:     Discharge planning Services  CM Consult  Post Acute Care Choice:    Choice offered to:     DME Arranged:    DME Agency:     HH Arranged:    HH Agency:     Status of Service:  In process, will continue to follow  Medicare Important Message Given:    Date Medicare IM Given:    Medicare IM give by:    Date Additional Medicare IM Given:    Additional Medicare Important Message give by:     If discussed at [place] of Stay Meetings, dates discussed:    Additional Comments:  Zenon Mayo, RN 03/30/2015, 2:41 PM

## 2015-03-30 NOTE — H&P (View-Only) (Signed)
Cardiology Office Note   Date:  03/25/2015   ID:  Steven Stevens, DOB [birth year], MRN [id number]  PCP:  Alonza Bogus, MD  Cardiologist: Ross/  Jory Sims, NP   No chief complaint on file.     History of Present Illness: Steven Stevens is a 70 y.o. male who presents for ongoing assessment and management of CAD, with history of drug-eluting stent to the mid and distal RCA in 2005, chronic occlusion of the LAD with distal collateralization.  An EF of 55% per cath in 2005.  As recent exercise stress test revealed a defect consistent with scar with some evidence of ischemia consistent with previous MI.  Other history includes hypertension, hyperlipidemia, and diabetes.he was last seen in the office in September of 2015 and was stable from a cardiovascular standpoint.  He was continued on all of his medications.  In a with his daughter with complaints of recurrent chest pain.  The patient states he has been having progressive chest tightness and pressure, non-radiating with mild dyspnea associated.  Pain goes away with rest.also, the patient's wife died approximately 10 weeks ago suddenly.  He at first developed a recurrent chest pain was related to the stress of her death, but he continues to have this has become concerned.  His daughter works for the [employer] at [facility], and accompanies him today.  Past Medical History  Diagnosis Date  . Hypertension   . High cholesterol   . Diabetes mellitus   . Myocardial infarct [facility]) 2005    Past Surgical History  Procedure Laterality Date  . Hernia repair    . Back surgery    . Colonoscopy    . Coronary angioplasty with stent placement  2005  . Cataract extraction w/phaco Right 12/03/2012    Procedure: CATARACT EXTRACTION RIGHT EYE (WITH PHACO) AND INTRAOCULAR LENS PLACEMENT  CDE=2.41;  Surgeon: Elta Guadeloupe [name], MD;  Location: [facility];  Service: Ophthalmology;  Laterality: Right;  . Cataract extraction w/phaco Left 12/17/2012   Procedure: CATARACT EXTRACTION PHACO AND INTRAOCULAR LENS PLACEMENT (IOC);  Surgeon: Elta Guadeloupe [name], MD;  Location: [facility];  Service: Ophthalmology;  Laterality: Left;  CDE:7.82  . Yag laser application Right [id number]    Procedure: YAG LASER APPLICATION;  Surgeon: Elta Guadeloupe [name], MD;  Location: [facility];  Service: Ophthalmology;  Laterality: Right;     Current Outpatient Prescriptions  Medication Sig Dispense Refill  . ALPRAZolam (XANAX) 1 MG tablet Take 1 mg by mouth 4 (four) times daily as needed for anxiety.     Marland Kitchen aspirin 325 MG tablet Take 325 mg by mouth daily.    Marland Kitchen atorvastatin (LIPITOR) 40 MG tablet TAKE 1 TABLET BY MOUTH EVERY DAY 30 tablet 0  . clopidogrel (PLAVIX) 75 MG tablet Take 75 mg by mouth daily.    Marland Kitchen glipiZIDE-metformin (METAGLIP) 5-500 MG per tablet Take 1 tablet by mouth 2 (two) times daily before a meal.     . HYDROcodone-acetaminophen (LORTAB) 10-500 MG per tablet Take 1 tablet by mouth every 6 (six) hours as needed for pain.     Marland Kitchen losartan-hydrochlorothiazide (HYZAAR) 100-25 MG tablet Take 1 tablet by mouth daily. 15 tablet 0  . metoprolol tartrate (LOPRESSOR) 25 MG tablet Take 12.5 mg by mouth 2 (two) times daily.    . nitroGLYCERIN (NITROSTAT) 0.4 MG SL tablet Place 1 tablet (0.4 mg total) under the tongue every 5 (five) minutes as needed for chest pain. 25 tablet 4  . ONE TOUCH ULTRA TEST  test strip     . sertraline (ZOLOFT) 50 MG tablet Take 50 mg by mouth daily.     Marland Kitchen terazosin (HYTRIN) 5 MG capsule Take 5 mg by mouth at bedtime.     No current facility-administered medications for this visit.    Allergies:   Review of patient's allergies indicates no known allergies.    Social History:  The patient  reports that he quit smoking about 11 years ago. His smoking use included Cigarettes. He smoked 1.00 pack per day. He has never used smokeless tobacco. He reports that he does not drink alcohol or use illicit drugs.   Family History:  The patient's family  history is not on file.    ROS: All other systems are reviewed and negative. Unless otherwise mentioned in H&P    PHYSICAL EXAM: VS:  BP 142/82 mmHg  Pulse 76  Ht 5' 8.5" (1.74 m)  Wt 312 lb (141.522 kg)  BMI 46.74 kg/m2  SpO2 94% , BMI Body mass index is 46.74 kg/(m^2). GEN: Well nourished, well developed, in no acute distressmorbidly obese. HEENT: normal Neck: no JVD, carotid bruits, or masses Cardiac: RRR, distant heart sounds; no murmurs, rubs, or gallops,no edema  Respiratory:  clear to auscultation bilaterally, normal work of breathing GI: soft, nontender, nondistended, + BS MS: no deformity or atrophy Skin: warm and dry, no rash Neuro:  Strength and sensation are intact Psych: euthymic mood, full affect   EKG:  The ekg ordered today demonstrates normal sinus rhythm, rate of 75 beats per minute, with inferior Q waves.   Recent Labs: No results found for requested labs within last 365 days.    Lipid Panel    Component Value Date/Time   CHOL 162 12/26/2012 0826   TRIG 199* 12/26/2012 0826   HDL 35* 12/26/2012 0826   CHOLHDL 4.6 12/26/2012 0826   VLDL 40 12/26/2012 0826   LDLCALC 87 12/26/2012 0826      Wt Readings from Last 3 Encounters:  03/25/15 312 lb (141.522 kg)  10/29/13 313 lb (141.976 kg)  05/09/13 319 lb (144.697 kg)      Other studies Reviewed: Additional studies/ records that were reviewed today include: cardiac catheterization report from 06/22/2003  Review of the above records demonstrates: LAD with a 40% eccentric stenosis at the diagonal and before the septal, after the second diagonal and septal the vessel was totally occluded.  The second diagonal has 50% segmental narrowing.  The right coronary artery was fairly smooth down the side of the total occlusion and then totally occluded.  The distal vessel severely diseased its point of the PDA.  The entire area was stented using a Cypher stent.   ASSESSMENT AND PLAN:  1. CAD: history of  drug-eluting stent to the right coronary artery with disease noted in the LAD, and first and second diagonal per cardiac catheterization in 2005.  The patient has continued cardiovascular risk factors include diabetes, hypertension, obesity.  He continues on aspirin, metoprolol, and Plavix.  He also continues on statin therapy.    Due to recurrent chest pain, which has become progressive with exertion, plus situational , with the occurrence of his wife's recent death, I discussed with Dr. Harl Bowie, the need to proceed with cardiac catheterization. After reviewing the patient's case, he is in agreement to proceed with cardiac catheterization.  I discussed with the patient the risks and benefits of the procedure, along with description of the procedure, which is significantly different than in 2005.  Both he  and his daughter verbalized understanding and are willing to proceed. I have written a new prescription for nitroglycerin as he does not have any  Fresh bottles.  Cardiac catheterization has been scheduled for 03/30/2015 at 10 AM with Dr.Varanosi.  Orders are written.  2. Hypertension:blood pressure is currently slightly elevated for someone with diabetes.  However, he is anxious, and continues to grieve over his wife as we discussed.  It.  I will continue him on losartan and metoprolol.  3. Hypercholesterolemia: He will continue on statin therapy.   Current medicines are reviewed at length with the patient today.    Labs/ tests ordered today include: Pre-cardiac cath with pre-procedure labs and X-rays.   Orders Placed This Encounter  Procedures  . DG Chest 2 View  . EKG 12-Lead     Disposition:   FU with Dr. Harl Bowie or myself post procedure.  Signed, Jory Sims, NP  03/25/2015 4:33 PM    [facility] [address] [place], [place] [address] Phone: [phone]; Fax: [phone]

## 2015-03-30 NOTE — Progress Notes (Signed)
Right radial tr band d/c'd. Right radial site level 0. Good csm. Palpable right radial pulse and rt ulnar 2t.  Pt tol well.

## 2015-03-31 DIAGNOSIS — I25119 Atherosclerotic heart disease of native coronary artery with unspecified angina pectoris: Secondary | ICD-10-CM | POA: Diagnosis not present

## 2015-03-31 DIAGNOSIS — E119 Type 2 diabetes mellitus without complications: Secondary | ICD-10-CM

## 2015-03-31 DIAGNOSIS — Z955 Presence of coronary angioplasty implant and graft: Secondary | ICD-10-CM | POA: Diagnosis not present

## 2015-03-31 DIAGNOSIS — Z7982 Long term (current) use of aspirin: Secondary | ICD-10-CM | POA: Diagnosis not present

## 2015-03-31 DIAGNOSIS — E785 Hyperlipidemia, unspecified: Secondary | ICD-10-CM | POA: Diagnosis not present

## 2015-03-31 DIAGNOSIS — I252 Old myocardial infarction: Secondary | ICD-10-CM | POA: Diagnosis not present

## 2015-03-31 DIAGNOSIS — Z9861 Coronary angioplasty status: Secondary | ICD-10-CM

## 2015-03-31 DIAGNOSIS — I10 Essential (primary) hypertension: Secondary | ICD-10-CM | POA: Diagnosis not present

## 2015-03-31 DIAGNOSIS — Z87891 Personal history of nicotine dependence: Secondary | ICD-10-CM | POA: Diagnosis not present

## 2015-03-31 DIAGNOSIS — E7800 Pure hypercholesterolemia, unspecified: Secondary | ICD-10-CM | POA: Diagnosis not present

## 2015-03-31 DIAGNOSIS — I2510 Atherosclerotic heart disease of native coronary artery without angina pectoris: Secondary | ICD-10-CM | POA: Diagnosis not present

## 2015-03-31 DIAGNOSIS — Z7902 Long term (current) use of antithrombotics/antiplatelets: Secondary | ICD-10-CM | POA: Diagnosis not present

## 2015-03-31 LAB — BASIC METABOLIC PANEL
ANION GAP: 11 (ref 5–15)
BUN: 15 mg/dL (ref 7–25)
BUN: 9 mg/dL (ref 6–20)
CALCIUM: 8.7 mg/dL (ref 8.6–10.3)
CHLORIDE: 103 mmol/L (ref 101–111)
CO2: 29 mmol/L (ref 20–31)
CO2: 26 mmol/L (ref 22–32)
Calcium: 8.5 mg/dL — ABNORMAL LOW (ref 8.9–10.3)
Chloride: 101 mmol/L (ref 98–110)
Creat: 1.1 mg/dL (ref 0.70–1.25)
Creatinine, Ser: 1.11 mg/dL (ref 0.61–1.24)
GFR calc non Af Amer: >60 mL/min (ref >60)
GLUCOSE: 119 mg/dL — AB (ref 65–99)
Glucose, Bld: 158 mg/dL — ABNORMAL HIGH (ref 65–99)
Potassium: 4.3 mmol/L (ref 3.5–5.3)
Potassium: 3.5 mmol/L (ref 3.5–5.1)
SODIUM: 138 mmol/L (ref 135–146)
Sodium: 140 mmol/L (ref 135–145)

## 2015-03-31 LAB — GLUCOSE, CAPILLARY: Glucose-Capillary: 134 mg/dL — ABNORMAL HIGH (ref 65–99)

## 2015-03-31 LAB — CBC
HCT: 32.8 % — ABNORMAL LOW (ref 39.0–52.0)
HEMOGLOBIN: 10.7 g/dL — AB (ref 13.0–17.0)
MCH: 29.5 pg (ref 26.0–34.0)
MCHC: 32.6 g/dL (ref 30.0–36.0)
MCV: 90.4 fL (ref 78.0–100.0)
Platelets: 229 10*3/uL (ref 150–400)
RBC: 3.63 MIL/uL — AB (ref 4.22–5.81)
RDW: 14.7 % (ref 11.5–15.5)
WBC: 8.6 10*3/uL (ref 4.0–10.5)

## 2015-03-31 MED ORDER — ASPIRIN 81 MG PO CHEW: 81.0000 mg | CHEWABLE_TABLET | Freq: Every day | ORAL | Status: DC

## 2015-03-31 MED ORDER — NITROGLYCERIN 0.4 MG SL SUBL: 0.4000 mg | SUBLINGUAL_TABLET | SUBLINGUAL | Status: DC | PRN

## 2015-03-31 MED ORDER — ACETAMINOPHEN 325 MG PO TABS: 650.0000 mg | ORAL_TABLET | ORAL | Status: DC | PRN

## 2015-03-31 MED ORDER — GLIPIZIDE-METFORMIN HCL 5-500 MG PO TABS: 1.0000 | ORAL_TABLET | Freq: Two times a day (BID) | ORAL | Status: DC

## 2015-03-31 NOTE — Progress Notes (Signed)
CARDIAC REHAB PHASE I   PRE:  Rate/Rhythm: 65 SR  BP:  Sitting: 123/65        SaO2: 97 RA  MODE:  Ambulation: 800 ft   POST:  Rate/Rhythm: 101 ST  BP:  Sitting: 148/93         SaO2: 97 RA  Pt ambulated 800 ft on RA, handheld assist, steady gait, tolerated well.  Pt c/o of mild DOE, denies cp, dizziness, declined rest stop. Completed PCI/stent education.  Reviewed risk factors, anti-platelet therapy, stent card, activity restrictions, ntg, exercise, heart healthy diet, carb counting, portion control, and phase 2 cardiac rehab. Pt verbalized understanding, very receptive to education. States he has been under a large amount of stress since his wife died suddenly 2 months ago. Emotional support given to pt. Pt agrees to phase 2 cardiac rehab referral, will send to [place] per pt request. Pt to edge of bed after walk, call bell within reach. Will follow.   [place], RN, BSN 03/31/2015 9:18 AM

## 2015-03-31 NOTE — Discharge Instructions (Signed)
Coronary Angiogram With Stent, Care After °Refer to this sheet in the next few weeks. These instructions provide you with information about caring for yourself after your procedure. Your health care provider may also give you more specific instructions. Your treatment has been planned according to current medical practices, but problems sometimes occur. Call your health care provider if you have any problems or questions after your procedure. °WHAT TO EXPECT AFTER THE PROCEDURE  °After your procedure, it is typical to have the following: °· Bruising at the catheter insertion site that usually fades within 1-2 weeks. °· Blood collecting in the tissue (hematoma) that may be painful to the touch. It should usually decrease in size and tenderness within 1-2 weeks. °HOME CARE INSTRUCTIONS °· Take medicines only as directed by your health care provider. Blood thinners may be prescribed after your procedure to improve blood flow through the stent. °· You may shower 24-48 hours after the procedure or as directed by your health care provider. Remove the bandage (dressing) and gently wash the catheter insertion site with plain soap and water. Pat the area dry with a clean towel. Do not rub the site, because this may cause bleeding. °· Do not take baths, swim, or use a hot tub until your health care provider approves. °· Check your catheter insertion site every day for redness, swelling, or drainage. °· Do not apply powder or lotion to the site. °· Do not lift over 10 lb (4.5 kg) for 5 days after your procedure or as directed by your health care provider. °· Ask your health care provider when it is okay to: °¨ Return to work or school. °¨ Resume usual physical activities or sports. °¨ Resume sexual activity. °· Eat a heart-healthy diet. This should include plenty of fresh fruits and vegetables. Meat should be lean cuts. Avoid the following types of food: °¨ Food that is high in salt. °¨ Canned or highly processed food. °¨ Food  that is high in saturated fat or sugar. °¨ Fried food. °· Make any other lifestyle changes as recommended by your health care provider. These may include: °¨ Not using any tobacco products, including cigarettes, chewing tobacco, or electronic cigarettes. If you need help quitting, ask your health care provider. °¨ Managing your weight. °¨ Getting regular exercise. °¨ Managing your blood pressure. °¨ Limiting your alcohol intake. °¨ Managing other health problems, such as diabetes. °· If you need an MRI after your heart stent has been placed, be sure to tell the health care provider who orders the MRI that you have a heart stent. °· Keep all follow-up visits as directed by your health care provider. This is important. °SEEK MEDICAL CARE IF: °· You have a fever. °· You have chills. °· You have increased bleeding from the catheter insertion site. Hold pressure on the site. °SEEK IMMEDIATE MEDICAL CARE IF: °· You develop chest pain or shortness of breath, feel faint, or pass out. °· You have unusual pain at the catheter insertion site. °· You have redness, warmth, or swelling at the catheter insertion site. °· You have drainage (other than a small amount of blood on the dressing) from the catheter insertion site. °· The catheter insertion site is bleeding, and the bleeding does not stop after 30 minutes of holding steady pressure on the site. °· You develop bleeding from any other place, such as from your rectum. There may be bright red blood in your urine or stool, or it may appear as black, tarry stool. °  °  This information is not intended to replace advice given to you by your health care provider. Make sure you discuss any questions you have with your health care provider.   Document Released: 08/12/2004 Document Revised: 02/13/2014 Document Reviewed: 06/17/2012 Elsevier Interactive Patient Education 2016 [employer] Inc[employer]   ** STOP losartan-hydrochlorothiazide (HYZAAR) 100-25 MG tablet **

## 2015-03-31 NOTE — Discharge Summary (Signed)
Discharge Summary    Patient ID: Steven Stevens,  MRN: [id number], DOB/AGE: [birth year] 70 y.o.  Admit date: 03/30/2015 Discharge date: 03/31/2015  Primary Care Provider: Alonza Bogus Primary Cardiologist: Dr Harrington Challenger  Discharge Diagnoses    Principal Problem:   Angina pectoris [facility]) Active Problems:   CAD S/P RCA DES '05 and 03/11/15   Essential hypertension   Type 2 diabetes mellitus treated without insulin (HCC)   Dyslipidemia   Obesity, morbid, BMI 40.0-49.9 (HCC)   Allergies No Known Allergies  Diagnostic Studies/Procedures    Cath/ PCI 03/30/15 _____________   History of Present Illness     70 y.o. male with known CAD admitted for cath secondary to recent history of exertional angina  Hospital Course     70 y/o morbidly obese AA male with history of a DES to the mid and distal RCA in 2005 with residual chronic occlusion of the LAD with distal collateralization. He has had normal LVF. He was seen in the office 03/25/15 and complained of exertional chest pain and dyspnea. Pain was relieved with rest. Also, the patient's wife died approximately 10 weeks ago suddenly. He at first developed a recurrent chest pain was related to the stress of her death, but he continues to have symptoms and became concerned. His daughter works for the [employer] at [facility], and accompanies him to the office visit. It was decided to proceed to cath and the pt was admitted 03/30/15 for OP cath. This revealed CTO mid LAD and a 70% distal LAD. The LAD fillied from L-L collaterals from diagonals. There was a patent RCA stent with a distal RCA 90% stenosis. The distal RCA site was treated with a Promus stent. LVF was normal. He tolerated this well. His B/P was noted to be low on admission and his Hyzaar was held, this may need to be resumed- possibly at a lower dose as an OP. Dr Ron Parker saw the pt the morning of 2/22 and felt he could be discharged.  F/U in 1-2 weeks in [place] will be arranged.  At  some point he should have a sleep study, he admits to poor sleep patterns and snoring and has morbid obesity and HTN.  _____________  Discharge Vitals Blood pressure 123/65, pulse 62, temperature 98.7 F (37.1 C), temperature source Oral, resp. rate 15, height 5' 8.5" (1.74 m), weight 307 lb 5.1 oz (139.4 kg), SpO2 95 %.  Filed Weights   03/30/15 0854 03/31/15 0451  Weight: 310 lb (140.615 kg) 307 lb 5.1 oz (139.4 kg)    Labs & Radiologic Studies     CBC  Recent Labs  03/30/15 0719 03/30/15 0930 03/31/15 0549  WBC 7.7 7.8 8.6  NEUTROABS 4.9  --   --   HGB 12.1* 11.6* 10.7*  HCT 36.8* 35.2* 32.8*  MCV 89.1 90.7 90.4  PLT 277 243 [id number]   Basic Metabolic Panel  Recent Labs  03/30/15 0930 03/31/15 0549  NA 140 140  K 3.7 3.5  CL 102 103  CO2 26 26  GLUCOSE 144* 119*  BUN 14 9  CREATININE 1.06 1.11  CALCIUM 8.7* 8.5*   Liver Function Tests No results for input(s): AST, ALT, ALKPHOS, BILITOT, PROT, ALBUMIN in the last 72 hours. No results for input(s): LIPASE, AMYLASE in the last 72 hours. Cardiac Enzymes No results for input(s): CKTOTAL, CKMB, CKMBINDEX, TROPONINI in the last 72 hours. BNP Invalid input(s): POCBNP D-Dimer No results for input(s): DDIMER in the last 72 hours. Hemoglobin  A1C No results for input(s): HGBA1C in the last 72 hours. Fasting Lipid Panel No results for input(s): CHOL, HDL, LDLCALC, TRIG, CHOLHDL, LDLDIRECT in the last 72 hours. Thyroid Function Tests No results for input(s): TSH, T4TOTAL, T3FREE, THYROIDAB in the last 72 hours.  Invalid input(s): FREET3  Dg Chest 2 View  03/25/2015  CLINICAL DATA:  70 year old male with chest tightness and shortness of breath with exertion for the past 2-3 weeks EXAM: CHEST  2 VIEW COMPARISON:  Prior chest x-ray 12/17/2009 FINDINGS: Stable cardiac and mediastinal contours. Atherosclerotic calcification again noted in the transverse aorta. Chronic interstitial prominence and bronchitic change are stable.  No focal airspace consolidation, pulmonary edema, pleural effusion or pneumothorax. IMPRESSION: Stable chest x-ray without evidence of acute cardiopulmonary process. Electronically Signed   By: Jacqulynn Cadet M.D.   On: 03/25/2015 19:59    Disposition   Pt is being discharged home today in good condition.  Follow-up Plans & Appointments    Follow-up Information    Follow up with Jory Sims, NP.   Specialties:  Nurse Practitioner, Radiology, Cardiology   Why:  office will contact you   Contact information:   [place] [address] [phone]        Discharge Medications   Current Discharge Medication List    START taking these medications   Details  acetaminophen (TYLENOL) 325 MG tablet Take 2 tablets (650 mg total) by mouth every 4 (four) hours as needed for headache or mild pain.    aspirin 81 MG chewable tablet Chew 1 tablet (81 mg total) by mouth daily.      CONTINUE these medications which have CHANGED   Details  glipiZIDE-metformin (METAGLIP) 5-500 MG tablet Take 1 tablet by mouth 2 (two) times daily before a meal.    nitroGLYCERIN (NITROSTAT) 0.4 MG SL tablet Place 1 tablet (0.4 mg total) under the tongue every 5 (five) minutes as needed for chest pain. Qty: 25 tablet, Refills: 2      CONTINUE these medications which have NOT CHANGED   Details  ALPRAZolam (XANAX) 1 MG tablet Take 1 mg by mouth 4 (four) times daily as needed for anxiety.     HYDROcodone-acetaminophen (LORTAB) 10-500 MG per tablet Take 1 tablet by mouth every 6 (six) hours as needed for pain.     metoprolol tartrate (LOPRESSOR) 25 MG tablet Take 12.5 mg by mouth 2 (two) times daily.    atorvastatin (LIPITOR) 40 MG tablet TAKE 1 TABLET BY MOUTH EVERY DAY Qty: 30 tablet, Refills: 0    clopidogrel (PLAVIX) 75 MG tablet Take 75 mg by mouth daily.    ONE TOUCH ULTRA TEST test strip     sertraline (ZOLOFT) 50 MG tablet Take 50 mg by mouth daily.     terazosin (HYTRIN) 5  MG capsule Take 5 mg by mouth at bedtime.      STOP taking these medications     aspirin 325 MG tablet      losartan-hydrochlorothiazide (HYZAAR) 100-25 MG tablet          Aspirin prescribed at discharge?  Yes High Intensity Statin Prescribed? (Lipitor 40-80mg  or Crestor 20-40mg ): Yes Beta Blocker Prescribed? Yes For EF 45% or less, Was ACEI/ARB Prescribed? No: NA ADP Receptor Inhibitor Prescribed? (i.e. Plavix etc.-Includes Medically Managed Patients): Yes For EF <40%, Aldosterone Inhibitor Prescribed? No: NA Was EF assessed during THIS hospitalization? Yes Was Cardiac Rehab II ordered? (Included Medically managed Patients): Yes   Outstanding Labs/Studies  Duration of Discharge Encounter   Greater than 30 minutes including physician time.  Angelena Form K PA 03/31/2015, 8:29 AM   Patient seen and examined. I agree with the assessment and plan as detailed above. See also my additional thoughts below.   The patient is ready for discharge. I made the decision for discharge. I agree with the discharge note as outlined above and the plans are being made.  Dola Argyle, MD, [facility] 03/31/2015 9:26 AM

## 2015-03-31 NOTE — Progress Notes (Signed)
Subjective:  No chest pain overnight  Objective:  Vital Signs in the last 24 hours: Temp:  [97.6 F (36.4 C)-98.8 F (37.1 C)] 98.7 F (37.1 C) (02/22 0451) Pulse Rate:  [0-63] 59 (02/22 0451) Resp:  [0-20] 15 (02/22 0451) BP: (93-149)/(45-88) 120/56 mmHg (02/22 0451) SpO2:  [0 %-100 %] 95 % (02/22 0451) Weight:  [307 lb 5.1 oz (139.4 kg)-310 lb (140.615 kg)] 307 lb 5.1 oz (139.4 kg) (02/22 0451)  Intake/Output from previous day:  Intake/Output Summary (Last 24 hours) at 03/31/15 0802 Last data filed at 03/30/15 1843  Gross per 24 hour  Intake 1123.31 ml  Output      1 ml  Net 1122.31 ml    Physical Exam: General appearance: alert, cooperative, no distress and morbidly obese Lungs: clear to auscultation bilaterally Heart: regular rate and rhythm Extremities: Rt radial site without hematoma   Rate: 56  Rhythm: normal sinus rhythm and sinus bradycardia  Lab Results:  Recent Labs  03/30/15 0930 03/31/15 0549  WBC 7.8 8.6  HGB 11.6* 10.7*  PLT 243 229    Recent Labs  03/30/15 0930 03/31/15 0549  NA 140 140  K 3.7 3.5  CL 102 103  CO2 26 26  GLUCOSE 144* 119*  BUN 14 9  CREATININE 1.06 1.11   No results for input(s): TROPONINI in the last 72 hours.  Invalid input(s): CK, MB  Recent Labs  03/30/15 0930  INR 1.19    Scheduled Meds: . aspirin  81 mg Oral Daily  . atorvastatin  40 mg Oral q1800  . clopidogrel  75 mg Oral Daily  . glipiZIDE  5 mg Oral BID AC  . metoprolol tartrate  12.5 mg Oral BID  . sertraline  50 mg Oral Daily  . sodium chloride flush  3 mL Intravenous Q12H  . terazosin  5 mg Oral QHS   Continuous Infusions:  PRN Meds:.sodium chloride, acetaminophen, ALPRAZolam, HYDROcodone-acetaminophen, nitroGLYCERIN, ondansetron (ZOFRAN) IV, sodium chloride flush   Imaging: Imaging results have been reviewed   Assessment/Plan:  70 y/o morbidly obese AA male with history of a DES to the mid and distal RCA in 2005 with residual  chronic occlusion of the LAD with distal collateralization. He has had normal LVF. He was seen in the office 03/25/15 and complained of exertional chest pain and dyspnea. Pain was relieved with rest. Also, the patient's wife died approximately 10 weeks ago suddenly. He at first developed a recurrent chest pain was related to the stress of her death, but he continues to have symptoms and became concerned. His daughter works for the [employer] at [facility] [employer], and accompanies him to the office visit. It was decided to proceed to cath and the pt was admitted 03/30/15 for OP cath. This revealed CTO mid LAD and a 70% distal LAD. The LAD fillied from L-L collaterals from diagonals. There was a patent RCA stent with a distal RCA 90% stenosis. The distal RCA site was treated with a Promus stent. LVF was normal. He tolerated this well.   Principal Problem:   Angina pectoris [facility]) Active Problems:   CAD S/P RCA DES '05 and 03/11/15   Essential hypertension   Type 2 diabetes mellitus treated without insulin (HCC)   Dyslipidemia   Obesity, morbid, BMI 40.0-49.9 ([place])   PLAN: DC today. Consider CTO LAD PCI if pt continues to have anginal symptoms. Pt admits to poor sleep and says he has been told he snores- BMI 46- he needs a  sleep study in the future. F/U Kathreen Cornfield and Dr Harrington Challenger.   Kerin Ransom PA-C 03/31/2015, 8:02 AM [phone]  Patient seen and examined. I agree with the assessment and plan as detailed above. See also my additional thoughts below.   The patient is stable and ready to be discharged home today. I agree with the complete note is outlined above and the plan.  Dola Argyle, MD, [facility] 03/31/2015 8:16 AM

## 2015-04-01 NOTE — Care Management Note (Signed)
Case Management Note  Patient Details  Name: Steven Stevens MRN: [id number] Date of Birth: 1945/10/30  Subjective/Objective:   Patient for dc, on plavix, pta indep.  No needs.                 Action/Plan:   Expected Discharge Date:                  Expected Discharge Plan:  Home/Self Care  In-House Referral:     Discharge planning Services  CM Consult  Post Acute Care Choice:    Choice offered to:     DME Arranged:    DME Agency:     HH Arranged:    [place] Agency:     Status of Service:  Completed, signed off  Medicare Important Message Given:    Date Medicare IM Given:    Medicare IM give by:    Date Additional Medicare IM Given:    Additional Medicare Important Message give by:     If discussed at [place] of Stay Meetings, dates discussed:    Additional Comments:  Zenon Mayo, RN 04/01/2015, 12:21 PM

## 2015-04-08 ENCOUNTER — Telehealth: Payer: Self-pay

## 2015-04-08 DIAGNOSIS — I2510 Atherosclerotic heart disease of native coronary artery without angina pectoris: Secondary | ICD-10-CM

## 2015-04-08 DIAGNOSIS — Z9861 Coronary angioplasty status: Secondary | ICD-10-CM

## 2015-04-08 DIAGNOSIS — Z01810 Encounter for preprocedural cardiovascular examination: Secondary | ICD-10-CM

## 2015-04-08 DIAGNOSIS — Z7901 Long term (current) use of anticoagulants: Secondary | ICD-10-CM

## 2015-04-08 NOTE — Telephone Encounter (Signed)
**Note De-Identified Eriyah Fernando Obfuscation** The pt is advised that his CTO is scheduled for 04/14/15 with Dr Irish Lack at 8:30. He is also advised that he may have his pre procedure lab work done at [facility] lab on Monday 04/12/15 either before or after his appt at our [place] office as he has requested so he will not have to drive to this office for lab draw. I have faxed lab orders to [facility] in [place] @ [phone] with a meassage to fax results to Dr Irish Lack at [phone]. I went over all CTO instructions with the pt and answered all of his questions. He is also aware that I am mailing CTO instructions to his address.

## 2015-04-08 NOTE — Telephone Encounter (Signed)
**Note De-Identified Argel Pablo Obfuscation** LMTCB.  Per Dr Irish Lack this pt needs to have a CTO on 3/8 due to CAD.

## 2015-04-12 ENCOUNTER — Encounter: Payer: Medicare Other | Admitting: Adult Health

## 2015-04-12 ENCOUNTER — Other Ambulatory Visit: Payer: Self-pay | Admitting: Interventional Cardiology

## 2015-04-12 DIAGNOSIS — I2510 Atherosclerotic heart disease of native coronary artery without angina pectoris: Secondary | ICD-10-CM | POA: Diagnosis not present

## 2015-04-12 DIAGNOSIS — Z01810 Encounter for preprocedural cardiovascular examination: Secondary | ICD-10-CM | POA: Diagnosis not present

## 2015-04-12 DIAGNOSIS — Z7901 Long term (current) use of anticoagulants: Secondary | ICD-10-CM | POA: Diagnosis not present

## 2015-04-12 DIAGNOSIS — Z9861 Coronary angioplasty status: Secondary | ICD-10-CM | POA: Diagnosis not present

## 2015-04-13 ENCOUNTER — Other Ambulatory Visit: Payer: Self-pay | Admitting: Interventional Cardiology

## 2015-04-13 DIAGNOSIS — I25118 Atherosclerotic heart disease of native coronary artery with other forms of angina pectoris: Secondary | ICD-10-CM

## 2015-04-13 LAB — BASIC METABOLIC PANEL
BUN: 13 mg/dL (ref 7–25)
CO2: 27 mmol/L (ref 20–31)
Calcium: 8.4 mg/dL — ABNORMAL LOW (ref 8.6–10.3)
Chloride: 106 mmol/L (ref 98–110)
Creat: 1.03 mg/dL (ref 0.70–1.25)
GLUCOSE: 81 mg/dL (ref 65–99)
POTASSIUM: 4.4 mmol/L (ref 3.5–5.3)
Sodium: 145 mmol/L (ref 135–146)

## 2015-04-13 LAB — CBC WITH DIFFERENTIAL/PLATELET
BASOS ABS: 0 10*3/uL (ref 0.0–0.1)
BASOS PCT: 0 % (ref 0–1)
EOS PCT: 3 % (ref 0–5)
Eosinophils Absolute: 0.2 10*3/uL (ref 0.0–0.7)
HEMATOCRIT: 34.3 % — AB (ref 39.0–52.0)
Hemoglobin: 11.1 g/dL — ABNORMAL LOW (ref 13.0–17.0)
LYMPHS PCT: 31 % (ref 12–46)
Lymphs Abs: 2.1 10*3/uL (ref 0.7–4.0)
MCH: 28.5 pg (ref 26.0–34.0)
MCHC: 32.4 g/dL (ref 30.0–36.0)
MCV: 88.2 fL (ref 78.0–100.0)
MONO ABS: 0.6 10*3/uL (ref 0.1–1.0)
MPV: 8.5 fL — AB (ref 8.6–12.4)
Monocytes Relative: 8 % (ref 3–12)
Neutro Abs: 4 10*3/uL (ref 1.7–7.7)
Neutrophils Relative %: 58 % (ref 43–77)
PLATELETS: 252 10*3/uL (ref 150–400)
RBC: 3.89 MIL/uL — AB (ref 4.22–5.81)
RDW: 15.4 % (ref 11.5–15.5)
WBC: 6.9 10*3/uL (ref 4.0–10.5)

## 2015-04-13 LAB — APTT: aPTT: 35 seconds (ref 24–37)

## 2015-04-13 LAB — PROTIME-INR
INR: 1.12 (ref <1.50)
PROTHROMBIN TIME: 14.5 s (ref 11.6–15.2)

## 2015-04-14 ENCOUNTER — Encounter (HOSPITAL_COMMUNITY)
Admission: RE | Disposition: A | Discharge status: Home / Self Care | Payer: Self-pay | Source: Ambulatory Visit | Attending: Interventional Cardiology

## 2015-04-14 ENCOUNTER — Encounter (HOSPITAL_COMMUNITY): Payer: Self-pay | Admitting: Interventional Cardiology

## 2015-04-14 ENCOUNTER — Ambulatory Visit (HOSPITAL_COMMUNITY)
Admission: RE | Admit: 2015-04-14 | Discharge: 2015-04-15 | Disposition: A | Discharge status: Home / Self Care | Payer: Medicare Other | Source: Ambulatory Visit | Attending: Interventional Cardiology | Admitting: Interventional Cardiology

## 2015-04-14 DIAGNOSIS — I2582 Chronic total occlusion of coronary artery: Secondary | ICD-10-CM | POA: Insufficient documentation

## 2015-04-14 DIAGNOSIS — I25119 Atherosclerotic heart disease of native coronary artery with unspecified angina pectoris: Secondary | ICD-10-CM | POA: Insufficient documentation

## 2015-04-14 DIAGNOSIS — Z9861 Coronary angioplasty status: Secondary | ICD-10-CM

## 2015-04-14 DIAGNOSIS — Z7982 Long term (current) use of aspirin: Secondary | ICD-10-CM | POA: Diagnosis not present

## 2015-04-14 DIAGNOSIS — I25719 Atherosclerosis of autologous vein coronary artery bypass graft(s) with unspecified angina pectoris: Secondary | ICD-10-CM | POA: Insufficient documentation

## 2015-04-14 DIAGNOSIS — I25118 Atherosclerotic heart disease of native coronary artery with other forms of angina pectoris: Secondary | ICD-10-CM

## 2015-04-14 DIAGNOSIS — I10 Essential (primary) hypertension: Secondary | ICD-10-CM | POA: Diagnosis not present

## 2015-04-14 DIAGNOSIS — Z955 Presence of coronary angioplasty implant and graft: Secondary | ICD-10-CM

## 2015-04-14 DIAGNOSIS — E785 Hyperlipidemia, unspecified: Secondary | ICD-10-CM | POA: Diagnosis not present

## 2015-04-14 DIAGNOSIS — Z6841 Body Mass Index (BMI) 40.0 and over, adult: Secondary | ICD-10-CM | POA: Insufficient documentation

## 2015-04-14 DIAGNOSIS — Z7984 Long term (current) use of oral hypoglycemic drugs: Secondary | ICD-10-CM | POA: Insufficient documentation

## 2015-04-14 DIAGNOSIS — E119 Type 2 diabetes mellitus without complications: Secondary | ICD-10-CM | POA: Diagnosis not present

## 2015-04-14 DIAGNOSIS — I2510 Atherosclerotic heart disease of native coronary artery without angina pectoris: Secondary | ICD-10-CM

## 2015-04-14 HISTORY — DX: Depression, unspecified: F32.A

## 2015-04-14 HISTORY — DX: Low back pain: M54.5

## 2015-04-14 HISTORY — DX: Major depressive disorder, single episode, unspecified: F32.9

## 2015-04-14 HISTORY — PX: CARDIAC CATHETERIZATION: SHX172

## 2015-04-14 HISTORY — DX: Type 2 diabetes mellitus without complications: E11.9

## 2015-04-14 HISTORY — DX: Other chronic pain: G89.29

## 2015-04-14 HISTORY — DX: Low back pain, unspecified: M54.50

## 2015-04-14 LAB — POCT ACTIVATED CLOTTING TIME
ACTIVATED CLOTTING TIME: 240 s
ACTIVATED CLOTTING TIME: 270 s
ACTIVATED CLOTTING TIME: 348 s
ACTIVATED CLOTTING TIME: 286 s
ACTIVATED CLOTTING TIME: 312 s
ACTIVATED CLOTTING TIME: 317 s
Activated Clotting Time: 260 seconds
Activated Clotting Time: 276 seconds
Activated Clotting Time: 281 seconds
Activated Clotting Time: 343 seconds

## 2015-04-14 LAB — GLUCOSE, CAPILLARY
GLUCOSE-CAPILLARY: 138 mg/dL — AB (ref 65–99)
Glucose-Capillary: 112 mg/dL — ABNORMAL HIGH (ref 65–99)
Glucose-Capillary: 83 mg/dL (ref 65–99)

## 2015-04-14 SURGERY — CORONARY CTO INTERVENTION

## 2015-04-14 MED ORDER — ASPIRIN 81 MG PO CHEW
81.0000 mg | CHEWABLE_TABLET | Freq: Every day | ORAL | Status: DC
  Administered 2015-04-15: 10:00:00 81 mg via ORAL
  Filled 2015-04-14: qty 1

## 2015-04-14 MED ORDER — LIDOCAINE HCL (PF) 1 % IJ SOLN
INTRAMUSCULAR | Status: DC | PRN
  Administered 2015-04-14: 30 mL via SUBCUTANEOUS

## 2015-04-14 MED ORDER — SODIUM CHLORIDE 0.9 % WEIGHT BASED INFUSION
1.0000 mL/kg/h | INTRAVENOUS | Status: DC
  Administered 2015-04-14 (×2): 1 mL/kg/h via INTRAVENOUS

## 2015-04-14 MED ORDER — CLOPIDOGREL BISULFATE 75 MG PO TABS
75.0000 mg | ORAL_TABLET | Freq: Every day | ORAL | Status: DC
  Administered 2015-04-15: 10:00:00 75 mg via ORAL
  Filled 2015-04-14: qty 1

## 2015-04-14 MED ORDER — METOPROLOL TARTRATE 12.5 MG HALF TABLET
12.5000 mg | ORAL_TABLET | Freq: Two times a day (BID) | ORAL | Status: DC
  Administered 2015-04-15: 12.5 mg via ORAL
  Filled 2015-04-14: qty 1

## 2015-04-14 MED ORDER — ONDANSETRON HCL 4 MG/2ML IJ SOLN: 4.0000 mg | Freq: Four times a day (QID) | INTRAMUSCULAR | Status: DC | PRN

## 2015-04-14 MED ORDER — ALPRAZOLAM 0.5 MG PO TABS: 1.0000 mg | ORAL_TABLET | Freq: Four times a day (QID) | ORAL | Status: DC | PRN

## 2015-04-14 MED ORDER — HEPARIN (PORCINE) IN NACL 2-0.9 UNIT/ML-% IJ SOLN
INTRAMUSCULAR | Status: AC
  Filled 2015-04-14: qty 1000

## 2015-04-14 MED ORDER — HEPARIN SODIUM (PORCINE) 1000 UNIT/ML IJ SOLN
INTRAMUSCULAR | Status: AC
  Filled 2015-04-14: qty 3

## 2015-04-14 MED ORDER — CLOPIDOGREL BISULFATE 75 MG PO TABS
ORAL_TABLET | ORAL | Status: AC
  Administered 2015-04-14: 75 mg via ORAL
  Filled 2015-04-14: qty 1

## 2015-04-14 MED ORDER — TERAZOSIN HCL 5 MG PO CAPS
5.0000 mg | ORAL_CAPSULE | Freq: Every day | ORAL | Status: DC
  Administered 2015-04-14: 5 mg via ORAL
  Filled 2015-04-14: qty 1

## 2015-04-14 MED ORDER — HYDROCODONE-ACETAMINOPHEN 5-325 MG PO TABS: 1.0000 | ORAL_TABLET | ORAL | Status: DC | PRN

## 2015-04-14 MED ORDER — ANGIOPLASTY BOOK
Freq: Once | Status: DC
  Filled 2015-04-14: qty 1

## 2015-04-14 MED ORDER — FENTANYL CITRATE (PF) 100 MCG/2ML IJ SOLN
INTRAMUSCULAR | Status: AC
  Filled 2015-04-14: qty 2

## 2015-04-14 MED ORDER — SODIUM CHLORIDE 0.9% FLUSH: 3.0000 mL | Freq: Two times a day (BID) | INTRAVENOUS | Status: DC

## 2015-04-14 MED ORDER — SERTRALINE HCL 50 MG PO TABS
50.0000 mg | ORAL_TABLET | Freq: Every day | ORAL | Status: DC
  Administered 2015-04-14 – 2015-04-15 (×2): 50 mg via ORAL
  Filled 2015-04-14 (×2): qty 1

## 2015-04-14 MED ORDER — ACETAMINOPHEN 325 MG PO TABS: 650.0000 mg | ORAL_TABLET | ORAL | Status: DC | PRN

## 2015-04-14 MED ORDER — HEPARIN SODIUM (PORCINE) 1000 UNIT/ML IJ SOLN
INTRAMUSCULAR | Status: DC | PRN
  Administered 2015-04-14 (×2): 4000 [IU] via INTRAVENOUS
  Administered 2015-04-14: 2000 [IU] via INTRAVENOUS
  Administered 2015-04-14: 3000 [IU] via INTRAVENOUS
  Administered 2015-04-14: 10000 [IU] via INTRAVENOUS
  Administered 2015-04-14: 5000 [IU] via INTRAVENOUS
  Administered 2015-04-14: 3000 [IU] via INTRAVENOUS

## 2015-04-14 MED ORDER — SODIUM CHLORIDE 0.9% FLUSH: 3.0000 mL | INTRAVENOUS | Status: DC | PRN

## 2015-04-14 MED ORDER — IOHEXOL 350 MG/ML SOLN
INTRAVENOUS | Status: DC | PRN
  Administered 2015-04-14: 197 mL via INTRA_ARTERIAL

## 2015-04-14 MED ORDER — CLOPIDOGREL BISULFATE 75 MG PO TABS
75.0000 mg | ORAL_TABLET | Freq: Once | ORAL | Status: AC
  Administered 2015-04-14: 75 mg via ORAL

## 2015-04-14 MED ORDER — SODIUM CHLORIDE 0.9 % IV SOLN: 250.0000 mL | INTRAVENOUS | Status: DC | PRN

## 2015-04-14 MED ORDER — SODIUM CHLORIDE 0.9 % WEIGHT BASED INFUSION
3.0000 mL/kg/h | INTRAVENOUS | Status: DC
  Administered 2015-04-14: 3 mL/kg/h via INTRAVENOUS

## 2015-04-14 MED ORDER — ATORVASTATIN CALCIUM 40 MG PO TABS
40.0000 mg | ORAL_TABLET | Freq: Every day | ORAL | Status: DC
  Administered 2015-04-14 – 2015-04-15 (×2): 40 mg via ORAL
  Filled 2015-04-14 (×2): qty 1

## 2015-04-14 MED ORDER — ASPIRIN 81 MG PO CHEW: 81.0000 mg | CHEWABLE_TABLET | ORAL | Status: DC

## 2015-04-14 MED ORDER — MIDAZOLAM HCL 2 MG/2ML IJ SOLN
INTRAMUSCULAR | Status: DC | PRN
  Administered 2015-04-14 (×2): 1 mg via INTRAVENOUS
  Administered 2015-04-14: 2 mg via INTRAVENOUS
  Administered 2015-04-14 (×2): 1 mg via INTRAVENOUS

## 2015-04-14 MED ORDER — FENTANYL CITRATE (PF) 100 MCG/2ML IJ SOLN
INTRAMUSCULAR | Status: DC | PRN
  Administered 2015-04-14: 50 ug via INTRAVENOUS
  Administered 2015-04-14 (×4): 25 ug via INTRAVENOUS

## 2015-04-14 MED ORDER — MIDAZOLAM HCL 2 MG/2ML IJ SOLN
INTRAMUSCULAR | Status: AC
  Filled 2015-04-14: qty 2

## 2015-04-14 MED ORDER — HEPARIN (PORCINE) IN NACL 2-0.9 UNIT/ML-% IJ SOLN
INTRAMUSCULAR | Status: DC | PRN
  Administered 2015-04-14: 2000 mL

## 2015-04-14 MED ORDER — LIDOCAINE HCL (PF) 1 % IJ SOLN
INTRAMUSCULAR | Status: AC
  Filled 2015-04-14: qty 60

## 2015-04-14 MED ORDER — HEPARIN SODIUM (PORCINE) 1000 UNIT/ML IJ SOLN
INTRAMUSCULAR | Status: AC
  Filled 2015-04-14: qty 2

## 2015-04-14 MED ORDER — NITROGLYCERIN 1 MG/10 ML FOR IR/CATH LAB
INTRA_ARTERIAL | Status: DC | PRN
  Administered 2015-04-14 (×3): 200 ug via INTRACORONARY

## 2015-04-14 MED ORDER — SODIUM CHLORIDE 0.9 % WEIGHT BASED INFUSION: 1.0000 mL/kg/h | INTRAVENOUS | Status: AC

## 2015-04-14 MED ORDER — NITROGLYCERIN 1 MG/10 ML FOR IR/CATH LAB
INTRA_ARTERIAL | Status: AC
  Filled 2015-04-14: qty 10

## 2015-04-14 SURGICAL SUPPLY — 43 items
ANGIOSEAL 8FR (Vascular Products) ×4 IMPLANT
BALLN EMERGE MR 2.0X15 (BALLOONS) ×4
BALLN EMERGE MR 2.5X15 (BALLOONS) ×4
BALLN EMERGE MR 3.0X12 (BALLOONS) ×4
BALLN EMERGE MR 3.0X15 (BALLOONS) ×4
BALLN MAVERICK OTW 2.0X15 (BALLOONS) ×4
BALLN ~~LOC~~ EMERGE MR 3.5X15 (BALLOONS) ×4
BALLOON EMERGE MR 2.0X15 (BALLOONS) IMPLANT
BALLOON EMERGE MR 2.5X15 (BALLOONS) IMPLANT
BALLOON EMERGE MR 3.0X12 (BALLOONS) IMPLANT
BALLOON EMERGE MR 3.0X15 (BALLOONS) IMPLANT
BALLOON MAVERICK OTW 2.0X15 (BALLOONS) IMPLANT
BALLOON ~~LOC~~ EMERGE MR 3.5X15 (BALLOONS) IMPLANT
CATH CROSSBOSS CTO (CATHETERS) ×2 IMPLANT
CATH INFINITI JR4 5F (CATHETERS) ×2 IMPLANT
CATH MACH1 8F CLS4 (CATHETERS) ×2 IMPLANT
CATH MACH1 8FR CLS3.5 (CATHETERS) ×2 IMPLANT
CATH STINGRAY CTO 135CM (CATHETERS) ×2 IMPLANT
DEVICE CLOSURE ANGIOSEAL 8FR (Vascular Products) IMPLANT
GUIDELINER 6F (CATHETERS) ×2 IMPLANT
GUIDELINER 7F (CATHETERS) ×2 IMPLANT
KIT ENCORE 26 ADVANTAGE (KITS) ×4 IMPLANT
KIT HEART LEFT (KITS) ×6 IMPLANT
PACK CARDIAC CATHETERIZATION (CUSTOM PROCEDURE TRAY) ×4 IMPLANT
SHEATH BRITE TIP 8FR 35CM (SHEATH) ×2 IMPLANT
SHEATH PINNACLE 6F 10CM (SHEATH) ×2 IMPLANT
SHEATH PINNACLE 8F 10CM (SHEATH) ×2 IMPLANT
STENT SYNERGY DES 2.5X38 (Permanent Stent) ×2 IMPLANT
STENT SYNERGY DES 3X24 (Permanent Stent) ×2 IMPLANT
STOPCOCK MORSE 400PSI 3WAY (MISCELLANEOUS) ×2 IMPLANT
TRANSDUCER W/STOPCOCK (MISCELLANEOUS) ×6 IMPLANT
TUBING CIL FLEX 10 FLL-RA (TUBING) ×6 IMPLANT
VALVE GUARDIAN II ~~LOC~~ HEMO (MISCELLANEOUS) ×2 IMPLANT
WIRE ASAHI CONFIANZPRO12 300CM (WIRE) ×2 IMPLANT
WIRE ASAHI FIELDER XT 190CM (WIRE) ×4 IMPLANT
WIRE ASAHI MIRACLEBROS-3 180CM (WIRE) ×2 IMPLANT
WIRE ASAHI MIRACLEBROS-6 180CM (WIRE) ×2 IMPLANT
WIRE ASAHI MIRACLEBROS-6 300CM (WIRE) ×2 IMPLANT
WIRE ASAHI PROWATER 180CM (WIRE) ×2 IMPLANT
WIRE ASAHI PROWATER 300CM (WIRE) ×2 IMPLANT
WIRE EMERALD 3MM-J .035X150CM (WIRE) ×2 IMPLANT
WIRE GUIDE ASAHI EXTENSION 165 (WIRE) ×4 IMPLANT
WIRE STINGRAY 300CM (WIRE) ×2 IMPLANT

## 2015-04-14 NOTE — Interval H&P Note (Signed)
Cath Lab Visit (complete for each Cath Lab visit)  Clinical Evaluation Leading to the Procedure:   ACS: No.  Non-ACS:    Anginal Classification: CCS III  Anti-ischemic medical therapy: Minimal Therapy (1 class of medications)  Non-Invasive Test Results: No non-invasive testing performed  Prior CABG: No previous CABG      History and Physical Interval Note:  04/14/2015 8:47 AM  Steven Stevens  has presented today for surgery, with the diagnosis of cad  The various methods of treatment have been discussed with the patient and family. After consideration of risks, benefits and other options for treatment, the patient has consented to  Procedure(s): Coronary/Bypass Graft CTO Intervention (N/A) as a surgical intervention .  The patient's history has been reviewed, patient examined, no change in status, stable for surgery.  I have reviewed the patient's chart and labs.  Questions were answered to the patient's satisfaction.     Steven Stevens S.

## 2015-04-14 NOTE — H&P (View-Only) (Signed)
Subjective:  No chest pain overnight  Objective:  Vital Signs in the last 24 hours: Temp:  [97.6 F (36.4 C)-98.8 F (37.1 C)] 98.7 F (37.1 C) (02/22 0451) Pulse Rate:  [0-63] 59 (02/22 0451) Resp:  [0-20] 15 (02/22 0451) BP: (93-149)/(45-88) 120/56 mmHg (02/22 0451) SpO2:  [0 %-100 %] 95 % (02/22 0451) Weight:  [307 lb 5.1 oz (139.4 kg)-310 lb (140.615 kg)] 307 lb 5.1 oz (139.4 kg) (02/22 0451)  Intake/Output from previous day:  Intake/Output Summary (Last 24 hours) at 03/31/15 0802 Last data filed at 03/30/15 1843  Gross per 24 hour  Intake 1123.31 ml  Output      1 ml  Net 1122.31 ml    Physical Exam: General appearance: alert, cooperative, no distress and morbidly obese Lungs: clear to auscultation bilaterally Heart: regular rate and rhythm Extremities: Rt radial site without hematoma   Rate: 56  Rhythm: normal sinus rhythm and sinus bradycardia  Lab Results:  Recent Labs  03/30/15 0930 03/31/15 0549  WBC 7.8 8.6  HGB 11.6* 10.7*  PLT 243 229    Recent Labs  03/30/15 0930 03/31/15 0549  NA 140 140  K 3.7 3.5  CL 102 103  CO2 26 26  GLUCOSE 144* 119*  BUN 14 9  CREATININE 1.06 1.11   No results for input(s): TROPONINI in the last 72 hours.  Invalid input(s): CK, MB  Recent Labs  03/30/15 0930  INR 1.19    Scheduled Meds: . aspirin  81 mg Oral Daily  . atorvastatin  40 mg Oral q1800  . clopidogrel  75 mg Oral Daily  . glipiZIDE  5 mg Oral BID AC  . metoprolol tartrate  12.5 mg Oral BID  . sertraline  50 mg Oral Daily  . sodium chloride flush  3 mL Intravenous Q12H  . terazosin  5 mg Oral QHS   Continuous Infusions:  PRN Meds:.sodium chloride, acetaminophen, ALPRAZolam, HYDROcodone-acetaminophen, nitroGLYCERIN, ondansetron (ZOFRAN) IV, sodium chloride flush   Imaging: Imaging results have been reviewed   Assessment/Plan:  70 y/o morbidly obese AA male with history of a DES to the mid and distal RCA in 2005 with residual  chronic occlusion of the LAD with distal collateralization. He has had normal LVF. He was seen in the office 03/25/15 and complained of exertional chest pain and dyspnea. Pain was relieved with rest. Also, the patient's wife died approximately 10 weeks ago suddenly. He at first developed a recurrent chest pain was related to the stress of her death, but he continues to have symptoms and became concerned. His daughter works for the [employer] at [facility], and accompanies him to the office visit. It was decided to proceed to cath and the pt was admitted 03/30/15 for OP cath. This revealed CTO mid LAD and a 70% distal LAD. The LAD fillied from L-L collaterals from diagonals. There was a patent RCA stent with a distal RCA 90% stenosis. The distal RCA site was treated with a Promus stent. LVF was normal. He tolerated this well.   Principal Problem:   Angina pectoris [facility]) Active Problems:   CAD S/P RCA DES '05 and 03/11/15   Essential hypertension   Type 2 diabetes mellitus treated without insulin (HCC)   Dyslipidemia   Obesity, morbid, BMI 40.0-49.9 ([place])   PLAN: DC today. Consider CTO LAD PCI if pt continues to have anginal symptoms. Pt admits to poor sleep and says he has been told he snores- BMI 46- he needs a  sleep study in the future. F/U Kathreen Cornfield and Dr Harrington Challenger.   Kerin Ransom PA-C 03/31/2015, 8:02 AM [phone]  Patient seen and examined. I agree with the assessment and plan as detailed above. See also my additional thoughts below.   The patient is stable and ready to be discharged home today. I agree with the complete note is outlined above and the plan.  Dola Argyle, MD, [facility] 03/31/2015 8:16 AM

## 2015-04-15 DIAGNOSIS — I25119 Atherosclerotic heart disease of native coronary artery with unspecified angina pectoris: Secondary | ICD-10-CM | POA: Diagnosis not present

## 2015-04-15 DIAGNOSIS — Z7984 Long term (current) use of oral hypoglycemic drugs: Secondary | ICD-10-CM | POA: Diagnosis not present

## 2015-04-15 DIAGNOSIS — E119 Type 2 diabetes mellitus without complications: Secondary | ICD-10-CM | POA: Diagnosis not present

## 2015-04-15 DIAGNOSIS — I2510 Atherosclerotic heart disease of native coronary artery without angina pectoris: Secondary | ICD-10-CM | POA: Diagnosis not present

## 2015-04-15 DIAGNOSIS — I2582 Chronic total occlusion of coronary artery: Secondary | ICD-10-CM | POA: Diagnosis not present

## 2015-04-15 DIAGNOSIS — I10 Essential (primary) hypertension: Secondary | ICD-10-CM

## 2015-04-15 DIAGNOSIS — E785 Hyperlipidemia, unspecified: Secondary | ICD-10-CM | POA: Diagnosis not present

## 2015-04-15 DIAGNOSIS — Z9861 Coronary angioplasty status: Secondary | ICD-10-CM

## 2015-04-15 DIAGNOSIS — Z7982 Long term (current) use of aspirin: Secondary | ICD-10-CM | POA: Diagnosis not present

## 2015-04-15 DIAGNOSIS — I25719 Atherosclerosis of autologous vein coronary artery bypass graft(s) with unspecified angina pectoris: Secondary | ICD-10-CM | POA: Diagnosis not present

## 2015-04-15 LAB — CBC
HCT: 30.5 % — ABNORMAL LOW (ref 39.0–52.0)
HEMOGLOBIN: 10 g/dL — AB (ref 13.0–17.0)
MCH: 29.6 pg (ref 26.0–34.0)
MCHC: 32.8 g/dL (ref 30.0–36.0)
MCV: 90.2 fL (ref 78.0–100.0)
PLATELETS: 193 10*3/uL (ref 150–400)
RBC: 3.38 MIL/uL — ABNORMAL LOW (ref 4.22–5.81)
RDW: 15.1 % (ref 11.5–15.5)
WBC: 8.7 10*3/uL (ref 4.0–10.5)

## 2015-04-15 LAB — BASIC METABOLIC PANEL
Anion gap: 11 (ref 5–15)
BUN: 11 mg/dL (ref 6–20)
CALCIUM: 8.3 mg/dL — AB (ref 8.9–10.3)
CO2: 25 mmol/L (ref 22–32)
CREATININE: 1.13 mg/dL (ref 0.61–1.24)
Chloride: 105 mmol/L (ref 101–111)
GFR calc Af Amer: >60 mL/min (ref >60)
GLUCOSE: 123 mg/dL — AB (ref 65–99)
Potassium: 4 mmol/L (ref 3.5–5.1)
Sodium: 141 mmol/L (ref 135–145)

## 2015-04-15 LAB — GLUCOSE, CAPILLARY: GLUCOSE-CAPILLARY: 133 mg/dL — AB (ref 65–99)

## 2015-04-15 MED ORDER — GLIPIZIDE-METFORMIN HCL 5-500 MG PO TABS: 1.0000 | ORAL_TABLET | Freq: Two times a day (BID) | ORAL | Status: DC

## 2015-04-15 MED ORDER — ATORVASTATIN CALCIUM 40 MG PO TABS: 40.0000 mg | ORAL_TABLET | Freq: Every day | ORAL | Status: DC

## 2015-04-15 MED ORDER — NITROGLYCERIN 0.4 MG SL SUBL: 0.4000 mg | SUBLINGUAL_TABLET | SUBLINGUAL | Status: DC | PRN

## 2015-04-15 MED ORDER — CLOPIDOGREL BISULFATE 75 MG PO TABS: 75.0000 mg | ORAL_TABLET | Freq: Every day | ORAL | Status: DC

## 2015-04-15 NOTE — Discharge Summary (Signed)
Discharge Summary    Patient ID: Steven Stevens,  MRN: [id number], DOB/AGE: 05/21/1945 70 y.o.  Admit date: 04/14/2015 Discharge date: 04/15/2015  Primary Care Provider: HAWKINS,EDWARD L Primary Cardiologist: Dr. Harrington Challenger   Discharge Diagnoses    Principal Problem:   Chronic total occlusion of LAD s/p CTO/PCI wiht DES 04/14/15 Active Problems:   Dyslipidemia   CAD S/P RCA DES '05 and 03/11/15   Essential hypertension   Obesity, morbid, BMI 40.0-49.9 (HCC)   Type 2 diabetes mellitus treated without insulin ([place])   Allergies No Known Allergies   History of Present Illness     70 y/o morbidly obese AA male with history of a DES to the mid and distal RCA in 2005 with residual chronic occlusion of the LAD with distal collateralization. He has had normal LVF. He was seen in the office 03/25/15 and complained of exertional chest pain and dyspnea. Pain was relieved with rest. Also, the patient's wife died approximately 10 weeks ago suddenly. He at first developed a recurrent chest pain was related to the stress of her death, but he continues to have symptoms and became concerned. His daughter works for the [employer] at [facility], and accompanies him to the office visit. It was decided to proceed to cath and the pt was admitted 03/30/15 for OP cath. This revealed CTO mid LAD and a 70% distal LAD. The LAD fillied from L-L collaterals from diagonals. There was a patent RCA stent with a distal RCA 90% stenosis. The distal RCA site was treated with a Promus stent. LVF was normal. He was discharged on 03/31/15.  It was decided to proceed with CTO PCI of the LAD. He presented to [facility] on 04/14/15 for this planned procedure with Dr. Irish Lack.   Hospital Course     Consultants: none  1. CAD: Day 1 s/p CTO PCI of mid LAD utilizing a DES. The previously placed RCA stent is patent. Patient is stable. No further CP. No dyspnea. Continue DAPT with ASA + Plavix for a minimum of 1 year. Continue BB and statin.    2. Post Cath: renal function, cath access site and vital signs are all stable.   3. O2 Desaturation: O2 sats dropped slightly overnight, this was also in the setting of nocturnal bradycardia. Given patient's obesity, will need to screen for OSA. We will arrange for OP sleep study. We will also have patient ambulate with cardiac rehab prior to discharge, to assess if any hypoxia with ambulation.   4. HTN: BP is controlled on current regimen.   5. HLD: on statin therapy with Lipitor. His last FLP on file is from 2014. We will obtain an outpatient FLP in clinic to assess his baseline LDL to determine if any additional lipid lowering therapies are needed.   6. DM: on metformin as an outpatient. Will advise patient to hold for an additional 36 hrs. This is followed by his PCP.     The patient has had an uncomplicated hospital course and is recovering well. The femoral catheter site is stable He has been seen by Dr. Ellyn Hack today and deemed ready for discharge home. All follow-up appointments have been scheduled. Discharge medications are listed below.  _____________  Discharge Vitals Blood pressure 127/51, pulse 54, temperature 98 F (36.7 C), temperature source Oral, resp. rate 17, height 5' 8.5" (1.74 m), weight 302 lb 14.6 oz (137.4 kg), SpO2 97 %.  Filed Weights   04/14/15 0658 04/15/15 0329  Weight: 302 lb (136.986 kg)  302 lb 14.6 oz (137.4 kg)  Physical Exam: General appearance: alert, cooperative, no distress and morbidly obese Lungs: clear to auscultation bilaterally Heart: regular rate and rhythm, very distant S1 and S2. Abdomen: Morbidly obese, soft/NT/76 NABS. Extremities: Rt groin without hematoma Neuro: Grossly intact. Able to ambulate without difficulty  Labs & Radiologic Studies     CBC  Recent Labs  04/12/15 1558 04/15/15 0325  WBC 6.9 8.7  NEUTROABS 4.0  --   HGB 11.1* 10.0*  HCT 34.3* 30.5*  MCV 88.2 90.2  PLT 252 0000000   Basic Metabolic Panel  Recent  Labs  04/12/15 1558 04/15/15 0325  NA 145 141  K 4.4 4.0  CL 106 105  CO2 27 25  GLUCOSE 81 123*  BUN 13 11  CREATININE 1.03 1.13  CALCIUM 8.4* 8.3*     Dg Chest 2 View  03/25/2015  CLINICAL DATA:  70 year old male with chest tightness and shortness of breath with exertion for the past 2-3 weeks EXAM: CHEST  2 VIEW COMPARISON:  Prior chest x-ray 12/17/2009 FINDINGS: Stable cardiac and mediastinal contours. Atherosclerotic calcification again noted in the transverse aorta. Chronic interstitial prominence and bronchitic change are stable. No focal airspace consolidation, pulmonary edema, pleural effusion or pneumothorax. IMPRESSION: Stable chest x-ray without evidence of acute cardiopulmonary process. Electronically Signed   By: Jacqulynn Cadet M.D.   On: 03/25/2015 19:59     Diagnostic Studies/Procedures    LHC 04/14/15  Procedures    Coronary/Bypass Graft CTO Intervention   Left Heart Cath and Coronary Angiography    Conclusion     Patent stent in the RCA.  Mid LAD to Dist LAD lesion, 100% stenosed., chronic total occlusion. Post intervention with overlapping Synergy drug-eluting stents, there is a 0% residual stenosis.  Severely elevated LVEDP, 36 mm Hg        _____________    Disposition   Pt is being discharged home today in good condition.  Follow-up Plans & Appointments    Follow-up Information    Follow up with Jory Sims, NP On 04/27/2015.   Specialties:  Nurse Practitioner, Radiology, Cardiology   Why:  2:30 PM (Cardiology follow-up)   Contact information:   [place] [address] [phone]      Discharge Instructions    AMB Referral to Cardiac Rehabilitation - Phase II    Complete by:  As directed   Diagnosis:  PCI     Amb Referral to Cardiac Rehabilitation    Complete by:  As directed   Diagnosis:  PCI Comment - already have contacted per pt. had CTO LAD done           Discharge Medications    Discharge Medication List as of 04/15/2015 11:01 AM    CONTINUE these medications which have CHANGED   Details  atorvastatin (LIPITOR) 40 MG tablet Take 1 tablet (40 mg total) by mouth daily., Starting 04/15/2015, Until Discontinued, Normal    clopidogrel (PLAVIX) 75 MG tablet Take 1 tablet (75 mg total) by mouth daily., Starting 04/15/2015, Until Discontinued, Normal    glipiZIDE-metformin (METAGLIP) 5-500 MG tablet Take 1 tablet by mouth 2 (two) times daily before a meal., Starting 04/17/2015, Until Discontinued, No Print    nitroGLYCERIN (NITROSTAT) 0.4 MG SL tablet Place 1 tablet (0.4 mg total) under the tongue every 5 (five) minutes as needed for chest pain., Starting 04/15/2015, Until Discontinued, Normal      CONTINUE these medications which have NOT CHANGED   Details  acetaminophen (TYLENOL) 325 MG tablet Take 2 tablets (650 mg total) by mouth every 4 (four) hours as needed for headache or mild pain., Starting 03/31/2015, Until Discontinued, No Print    ALPRAZolam (XANAX) 1 MG tablet Take 1 mg by mouth 4 (four) times daily as needed for anxiety. , Until Discontinued, Historical Med    aspirin 81 MG chewable tablet Chew 1 tablet (81 mg total) by mouth daily., Starting 03/31/2015, Until Discontinued, No Print    HYDROcodone-acetaminophen (LORTAB) 10-500 MG per tablet Take 1 tablet by mouth every 6 (six) hours as needed for pain. , Until Discontinued, Historical Med    metoprolol tartrate (LOPRESSOR) 25 MG tablet Take 12.5 mg by mouth 2 (two) times daily., Until Discontinued, Historical Med    ONE TOUCH ULTRA TEST test strip Historical Med    sertraline (ZOLOFT) 50 MG tablet Take 50 mg by mouth daily. , Starting 10/15/2013, Until Discontinued, Historical Med    terazosin (HYTRIN) 5 MG capsule Take 5 mg by mouth at bedtime., Until Discontinued, Historical Med         Outstanding Labs/Studies   FLP and sleep study  Duration of Discharge Encounter   Greater than 30 minutes including  physician time.  SignedAngelena Form R PA-C 04/15/2015, 3:06 PM   I saw and evaluated the patient this morning and indicated that he was ready for discharge provided he was able to walk without desaturations. He did have some oxygen desaturations overnight and I think benefit from a outpatient sleep study.  He is already on stable cardiac regimen.  He is otherwise stable for discharge.   Leonie Man, M.D., M.S. Interventional Cardiologist   Pager # [phone] Phone # [phone] [address] [place], [place] [address]

## 2015-04-15 NOTE — Progress Notes (Addendum)
930 am ambulated pt on room air , O2 sat 90-92 % ,denies any discomforts, HR 100-106 while ambulating , decreased HR to 70's -50's  And O2 sat 96-98% on RA ,  at rest.

## 2015-04-15 NOTE — Progress Notes (Signed)
CARDIAC REHAB PHASE I   PRE:  Rate/Rhythm: 43 SR  BP:  Supine: 115/51  Sitting:   Standing:    SaO2:   MODE:  Ambulation: 800 ft   POST:  Rate/Rhythm: 114 ST  BP:  Supine:   Sitting: 142/53  Standing:    SaO2:  0755-0830 Pt seen by [place] on 2/22 so brief review done. Reviewed NTG use and ex ed. Pt stated he felt comfortable with diet and other information. Knows how important it is to take plavix. Pt stated he had already been contacted by [facility] in [place] but I did send update order. Pt walked 800 ft with steady gait. Slightly SOB but did not need to stop. Think some of the SOB due to size. Pt stated he is going to really motivate self to lose weight. Stated he has lost 30 to 40 pounds in past and gained it back. Wife died recently and he stated he has not been very motivated but he is going to start now. Slight tightness in chest but no CP. Tolerated well.Graylon Good, RN BSN  04/15/2015 8:25 AM

## 2015-04-15 NOTE — Progress Notes (Signed)
Patient Profile: 70 y/o obese AAM with known CAD, HTN, DLD and DM admitted for elective LHC for CTO PCI of his mid LAD.   Subjective: Stable w/o recurrent CP. No dyspnea. Cath access site is stable w/o complication.   Objective: Vital signs in last 24 hours: Temp:  [98 F (36.7 C)-99.1 F (37.3 C)] 99.1 F (37.3 C) (03/09 0329) Pulse Rate:  [35-61] 54 (03/09 0329) Resp:  [10-24] 18 (03/09 0329) BP: (93-134)/(44-81) 127/51 mmHg (03/09 0329) SpO2:  [79 %-100 %] 97 % (03/09 0329) Weight:  [302 lb 14.6 oz (137.4 kg)] 302 lb 14.6 oz (137.4 kg) (03/09 0329) Last BM Date: 04/14/15  Intake/Output from previous day: 03/08 0701 - 03/09 0700 In: 480 [P.O.:480] Out: 200 [Urine:200] Intake/Output this shift:    Medications Current Facility-Administered Medications  Medication Dose Route Frequency Provider Last Rate Last Dose  . 0.9 %  sodium chloride infusion  250 mL Intravenous PRN Peter M Martinique, MD      . acetaminophen (TYLENOL) tablet 650 mg  650 mg Oral Q4H PRN Peter M Martinique, MD      . ALPRAZolam Duanne Moron) tablet 1 mg  1 mg Oral QID PRN Peter M Martinique, MD      . angioplasty book   Does not apply Once Jettie Booze, MD      . aspirin chewable tablet 81 mg  81 mg Oral Daily Peter M Martinique, MD      . atorvastatin (LIPITOR) tablet 40 mg  40 mg Oral Daily Peter M Martinique, MD   40 mg at 04/14/15 2124  . clopidogrel (PLAVIX) tablet 75 mg  75 mg Oral Daily Peter M Martinique, MD      . HYDROcodone-acetaminophen (NORCO/VICODIN) 5-325 MG per tablet 1 tablet  1 tablet Oral Q4H PRN Peter M Martinique, MD      . metoprolol tartrate (LOPRESSOR) tablet 12.5 mg  12.5 mg Oral BID Peter M Martinique, MD   12.5 mg at 04/14/15 2200  . ondansetron (ZOFRAN) injection 4 mg  4 mg Intravenous Q6H PRN Peter M Martinique, MD      . sertraline (ZOLOFT) tablet 50 mg  50 mg Oral Daily Peter M Martinique, MD   50 mg at 04/14/15 1356  . sodium chloride flush (NS) 0.9 % injection 3 mL  3 mL Intravenous Q12H Peter M Martinique, MD    3 mL at 04/14/15 1906  . sodium chloride flush (NS) 0.9 % injection 3 mL  3 mL Intravenous PRN Peter M Martinique, MD      . terazosin (HYTRIN) capsule 5 mg  5 mg Oral QHS Peter M Martinique, MD   5 mg at 04/14/15 2124    PE: General appearance: alert, cooperative, no distress and moderately obese Neck: no carotid bruit and no JVD Lungs: clear to auscultation bilaterally Heart: regular rate and rhythm, S1, S2 normal, no murmur, click, rub or gallop Extremities: no LEE Pulses: 2+ and symmetric Skin: warm and dry Neurologic: Grossly normal  Lab Results:   Recent Labs  04/12/15 1558 04/15/15 0325  WBC 6.9 8.7  HGB 11.1* 10.0*  HCT 34.3* 30.5*  PLT 252 193   BMET  Recent Labs  04/12/15 1558 04/15/15 0325  NA 145 141  K 4.4 4.0  CL 106 105  CO2 27 25  GLUCOSE 81 123*  BUN 13 11  CREATININE 1.03 1.13  CALCIUM 8.4* 8.3*   PT/INR  Recent Labs  04/12/15 1558  LABPROT 14.5  INR 1.12  Studies/Results: LHC 04/14/15  Procedures    Coronary/Bypass Graft CTO Intervention   Left Heart Cath and Coronary Angiography    Conclusion     Patent stent in the RCA.  Mid LAD to Dist LAD lesion, 100% stenosed., chronic total occlusion. Post intervention with overlapping Synergy drug-eluting stents, there is a 0% residual stenosis.  Severely elevated LVEDP, 36 mm Hg   Assessment/Plan    Principal Problem:   Angina pectoris (HCC) Active Problems:   Dyslipidemia   CAD S/P RCA DES '05 and 03/11/15   Essential hypertension   Obesity, morbid, BMI 40.0-49.9 (HCC)   Type 2 diabetes mellitus treated without insulin ([place])   DOE (dyspnea on exertion)   1. CAD: Day 1 s/p CTO PCI of mid LAD utilizing a DES. The previously placed RCA stent is patent.  Patient is stable. No further CP. No dyspnea. Continue DAPT with ASA + Plavix for a minimum of 1 year. Continue BB and statin.   2. Post Cath: renal function, cath access site and vital signs are all stable.   3. O2 Desaturation: O2  sats dropped slightly overnight, this was also in the setting of nocturnal bradycardia. Given patient's obesity, will need to screen for OSA. We will arrange for OP sleep study.  We will also have patient ambulate with cardiac rehab prior to discharge, to assess if any hypoxia with ambulation.   4. HTN: BP is controlled on current regimen.   5. HLD: on statin therapy with Lipitor. His last FLP on file is from 2014. We will obtain an outpatient FLP in clinic to assess his baseline LDL to determine if any additional lipid lowering therapies are needed.   6. DM: on metformin as an outpatient. Will advise patient to hold for an additional 36 hrs. This is followed by his PCP.   Dispo: ambulate with cardiac rehab prior to discharge. If no difficulties walking and if O2 sats remain stable, will plan for discharge home today.   Dondi Aime M. Rosita Fire, PA-C 04/15/2015 8:00 AM

## 2015-04-27 ENCOUNTER — Encounter: Payer: Self-pay | Admitting: Adult Health

## 2015-04-27 ENCOUNTER — Ambulatory Visit (INDEPENDENT_AMBULATORY_CARE_PROVIDER_SITE_OTHER): Payer: Medicare Other | Admitting: Adult Health

## 2015-04-27 VITALS — BP 104/76 | HR 81 | Ht 68.5 in | Wt 305.0 lb

## 2015-04-27 DIAGNOSIS — T82218A Other mechanical complication of coronary artery bypass graft, initial encounter: Secondary | ICD-10-CM

## 2015-04-27 DIAGNOSIS — I950 Idiopathic hypotension: Secondary | ICD-10-CM

## 2015-04-27 DIAGNOSIS — E7800 Pure hypercholesterolemia, unspecified: Secondary | ICD-10-CM | POA: Diagnosis not present

## 2015-04-27 DIAGNOSIS — K6389 Other specified diseases of intestine: Secondary | ICD-10-CM | POA: Diagnosis not present

## 2015-04-27 NOTE — Patient Instructions (Signed)
Your physician recommends that you schedule a follow-up appointment in: 3 Months  You have been referred to Cardiac rehab  If you need a refill on your cardiac medications before your next appointment, please call your pharmacy.  Thank you for choosing [facility]!

## 2015-04-27 NOTE — Progress Notes (Signed)
Cardiology Office Note   Date:  04/27/2015   ID:  Steven Stevens, DOB [birth year], MRN [id number]  PCP:  Alonza Bogus, MD  Cardiologist:  Jory Sims, NP   Chief Complaint  Patient presents with  . Coronary Artery Disease    S/P stenting      History of Present Illness: Steven Stevens is a 70 y.o. male who presents for ongoing assessment and management of CAD, status post cardiac catheterization in the setting of recurrent chest pain.  This revealed CTO, mid LAD, and 70% distal LAD. The LAD filled from left to left collaterals from diagonals. There was a patent stent to his right coronary artery and distal RCA stenosis of 90%.  The right coronary artery was treated with the promise drug-eluting stent.  The patient returned for staged intervention of his mid LAD, where he received overlapping synergy, drug-eluting stents to the LAD, with 0% residual stenosis on 04/12/2015..he was continued on no antiplatelet therapy with aspirin, Plavix, statin therapy, with Lipitor.  It was suggested that he followup for her a sleep study to screen for obstructive sleep apnea.  He comes today feeling very well.  He is gone.  His energy back, he is breathing better, he denies any chest pain.  He is grateful for the physicians at [facility], who placed the stents.  He is tolerating his medication without dizziness, bleeding, or muscle aches and pains.  Past Medical History  Diagnosis Date  . Hypertension   . High cholesterol   . Coronary artery disease   . Myocardial infarct ([place]) 01/27/2004  . Type II diabetes mellitus ([place])   . Chronic lower back pain   . Depression     "lost wife 01/14/2015"    Past Surgical History  Procedure Laterality Date  . Back surgery    . Colonoscopy    . Cataract extraction w/phaco Right 12/03/2012    Procedure: CATARACT EXTRACTION RIGHT EYE (WITH PHACO) AND INTRAOCULAR LENS PLACEMENT  CDE=2.41;  Surgeon: Elta Guadeloupe [name], MD;  Location: [facility];  Service:  Ophthalmology;  Laterality: Right;  . Cataract extraction w/phaco Left 12/17/2012    Procedure: CATARACT EXTRACTION PHACO AND INTRAOCULAR LENS PLACEMENT (IOC);  Surgeon: Elta Guadeloupe [name], MD;  Location: [facility];  Service: Ophthalmology;  Laterality: Left;  CDE:7.82  . Yag laser application Right [id number]    Procedure: YAG LASER APPLICATION;  Surgeon: Elta Guadeloupe [name], MD;  Location: [facility];  Service: Ophthalmology;  Laterality: Right;  . Coronary stent placement  03/30/2015    RCA    DES  . Coronary angioplasty with stent placement  2005  . Cardiac catheterization N/A 03/30/2015    Procedure: Left Heart Cath and Coronary Angiography;  Surgeon: Jettie Booze, MD;  Location: [place] [facility];  Service: Cardiovascular;  Laterality: N/A;  . Cardiac catheterization  03/30/2015    Procedure: Coronary Stent Intervention;  Surgeon: Jettie Booze, MD;  Location: [place] [facility];  Service: Cardiovascular;;  . Cardiac catheterization N/A 04/14/2015    Procedure: Coronary/Bypass Graft CTO Intervention;  Surgeon: Jettie Booze, MD;  Location: [place] [facility];  Service: Cardiovascular;  Laterality: N/A;  . Cardiac catheterization  04/14/2015    Procedure: Left Heart Cath and Coronary Angiography;  Surgeon: Jettie Booze, MD;  Location: [place] [facility];  Service: Cardiovascular;;  . Hernia repair    . Umbilical hernia repair  1990s  . Knee arthroscopy Left 2012  . Lumbar disc surgery  1995    "  herniated disc"     Current Outpatient Prescriptions  Medication Sig Dispense Refill  . acetaminophen (TYLENOL) 325 MG tablet Take 2 tablets (650 mg total) by mouth every 4 (four) hours as needed for headache or mild pain.    Marland Kitchen ALPRAZolam (XANAX) 1 MG tablet Take 1 mg by mouth 4 (four) times daily as needed for anxiety.     Marland Kitchen aspirin EC 81 MG tablet Take 81 mg by mouth daily.    Marland Kitchen atorvastatin (LIPITOR) 40 MG tablet Take 1 tablet (40 mg total) by mouth daily. 30 tablet 5  .  clopidogrel (PLAVIX) 75 MG tablet Take 1 tablet (75 mg total) by mouth daily. 30 tablet 11  . glipiZIDE-metformin (METAGLIP) 5-500 MG tablet Take 1 tablet by mouth 2 (two) times daily before a meal.    . HYDROcodone-acetaminophen (LORTAB) 10-500 MG per tablet Take 1 tablet by mouth every 6 (six) hours as needed for pain.     . metoprolol tartrate (LOPRESSOR) 25 MG tablet Take 12.5 mg by mouth 2 (two) times daily.    . nitroGLYCERIN (NITROSTAT) 0.4 MG SL tablet Place 1 tablet (0.4 mg total) under the tongue every 5 (five) minutes as needed for chest pain. 25 tablet 2  . ONE TOUCH ULTRA TEST test strip     . sertraline (ZOLOFT) 50 MG tablet Take 50 mg by mouth daily.     Marland Kitchen terazosin (HYTRIN) 5 MG capsule Take 5 mg by mouth at bedtime.     No current facility-administered medications for this visit.    Allergies:   Review of patient's allergies indicates no known allergies.    Social History:  The patient  reports that he quit smoking about 11 years ago. His smoking use included Cigarettes. He has a 1 pack-year smoking history. He has never used smokeless tobacco. He reports that he does not drink alcohol or use illicit drugs.   Family History:  The patient's family history is not on file.    ROS: All other systems are reviewed and negative. Unless otherwise mentioned in H&P    PHYSICAL EXAM: VS:  BP 104/76 mmHg  Pulse 81  Ht 5' 8.5" (1.74 m)  Wt 305 lb (138.347 kg)  BMI 45.70 kg/m2  SpO2 97% , BMI Body mass index is 45.7 kg/(m^2). GEN: Well nourished, well developed, in no acute distressobese. HEENT: normal Neck: no JVD, carotid bruits, or masses Cardiac: RRR;distant,  no murmurs, rubs, or gallops,no edema  Respiratory:  clear to auscultation bilaterally, normal work of breathing GI: soft, nontender, nondistended, + BS MS: no deformity or atrophy Skin: warm and dry, no rash Neuro:  Strength and sensation are intact Psych: euthymic mood, full affect   Recent Labs: 04/15/2015:  BUN 11; Creatinine, Ser 1.13; Hemoglobin 10.0*; Platelets 193; Potassium 4.0; Sodium 141    Lipid Panel    Component Value Date/Time   CHOL 162 12/26/2012 0826   TRIG 199* 12/26/2012 0826   HDL 35* 12/26/2012 0826   CHOLHDL 4.6 12/26/2012 0826   VLDL 40 12/26/2012 0826   LDLCALC 87 12/26/2012 0826      Wt Readings from Last 3 Encounters:  04/27/15 305 lb (138.347 kg)  04/15/15 302 lb 14.6 oz (137.4 kg)  03/31/15 307 lb 5.1 oz (139.4 kg)     ASSESSMENT AND PLAN:  1. Coronary artery disease: Status post stenting of the bypass graft of the LAD, and RCA.  He is tolerating medications well without complaint of chest pain, bleeding, dizziness, or muscle  cramping.we will continue dual antiplatelet therapy.  He will be referred to cardiac rehabilitation for education and strengthening.  We will see him again in 3 months unless he becomes symptomatic.  2. Hypotension: he is not on any antihypertensive medications, he will remain on metoprolol 12.5 mg twice a day.  Blood pressure is soft, but we will not adjust medications at this time and tell he becomes more active.  3. Hypercholesterolemia:continue Lipitor 40 mg daily, follow-up lipids and LFTs in 3 months.   Current medicines are reviewed at length with the patient today.    Labs/ tests ordered today include:  Orders Placed This Encounter  Procedures  . AMB referral to cardiac rehabilitation     Disposition:   FU with  3 months  Signed, Jory Sims, NP  04/27/2015 3:36 PM    [facility] [address] [place], [place] [address] Phone: [phone]; Fax: [phone]

## 2015-04-27 NOTE — Progress Notes (Signed)
Name: Steven Stevens    DOB: [birth year]  Age: 70 y.o.  MR#: [id number]       PCP:  Alonza Bogus, MD      Insurance: Payor: [employer] MEDICARE / Plan: [facility] MEDICARE / Product Type: *No Product type* /   CC:   No chief complaint on file.   VS Filed Vitals:   04/27/15 1451  BP: 104/76  Pulse: 81  Height: 5' 8.5" (1.74 m)  Weight: 305 lb (138.347 kg)  SpO2: 97%    Weights Current Weight  04/27/15 305 lb (138.347 kg)  04/15/15 302 lb 14.6 oz (137.4 kg)  03/31/15 307 lb 5.1 oz (139.4 kg)    Blood Pressure  BP Readings from Last 3 Encounters:  04/27/15 104/76  04/15/15 127/51  03/31/15 123/65     Admit date:  (Not on file) Last encounter with RMR:  03/25/2015   Allergy Review of patient's allergies indicates no known allergies.  Current Outpatient Prescriptions  Medication Sig Dispense Refill  . acetaminophen (TYLENOL) 325 MG tablet Take 2 tablets (650 mg total) by mouth every 4 (four) hours as needed for headache or mild pain.    Marland Kitchen ALPRAZolam (XANAX) 1 MG tablet Take 1 mg by mouth 4 (four) times daily as needed for anxiety.     Marland Kitchen aspirin EC 81 MG tablet Take 81 mg by mouth daily.    Marland Kitchen atorvastatin (LIPITOR) 40 MG tablet Take 1 tablet (40 mg total) by mouth daily. 30 tablet 5  . clopidogrel (PLAVIX) 75 MG tablet Take 1 tablet (75 mg total) by mouth daily. 30 tablet 11  . glipiZIDE-metformin (METAGLIP) 5-500 MG tablet Take 1 tablet by mouth 2 (two) times daily before a meal.    . HYDROcodone-acetaminophen (LORTAB) 10-500 MG per tablet Take 1 tablet by mouth every 6 (six) hours as needed for pain.     . metoprolol tartrate (LOPRESSOR) 25 MG tablet Take 12.5 mg by mouth 2 (two) times daily.    . nitroGLYCERIN (NITROSTAT) 0.4 MG SL tablet Place 1 tablet (0.4 mg total) under the tongue every 5 (five) minutes as needed for chest pain. 25 tablet 2  . ONE TOUCH ULTRA TEST test strip     . sertraline (ZOLOFT) 50 MG tablet Take 50 mg by mouth daily.     Marland Kitchen terazosin (HYTRIN) 5  MG capsule Take 5 mg by mouth at bedtime.     No current facility-administered medications for this visit.    Discontinued Meds:    Medications Discontinued During This Encounter  Medication Reason  . aspirin 81 MG chewable tablet Error    Patient Active Problem List   Diagnosis Date Noted  . Chronic total occlusion of LAD s/p CTO/PCI wiht DES 04/14/15 04/15/2015  . Type 2 diabetes mellitus treated without insulin ([place]) 03/31/2015  . Essential hypertension 10/29/2013  . Obesity, morbid, BMI 40.0-49.9 ([place]) 10/29/2013  . Dyslipidemia 05/31/2012  . CAD S/P RCA DES '05 and 03/11/15 05/31/2012    LABS    Component Value Date/Time   NA 141 04/15/2015 0325   NA 145 04/12/2015 1558   NA 140 03/31/2015 0549   K 4.0 04/15/2015 0325   K 4.4 04/12/2015 1558   K 3.5 03/31/2015 0549   CL 105 04/15/2015 0325   CL 106 04/12/2015 1558   CL 103 03/31/2015 0549   CO2 25 04/15/2015 0325   CO2 27 04/12/2015 1558   CO2 26 03/31/2015 0549   GLUCOSE 123* 04/15/2015 0325  GLUCOSE 81 04/12/2015 1558   GLUCOSE 119* 03/31/2015 0549   BUN 11 04/15/2015 0325   BUN 13 04/12/2015 1558   BUN 9 03/31/2015 0549   CREATININE 1.13 04/15/2015 0325   CREATININE 1.03 04/12/2015 1558   CREATININE 1.11 03/31/2015 0549   CREATININE 1.06 03/30/2015 0930   CREATININE 1.10 03/30/2015 0719   CREATININE 1.25 12/26/2012 0831   CALCIUM 8.3* 04/15/2015 0325   CALCIUM 8.4* 04/12/2015 1558   CALCIUM 8.5* 03/31/2015 0549   GFRNONAA >60 04/15/2015 0325   GFRNONAA >60 03/31/2015 0549   GFRNONAA >60 03/30/2015 0930   GFRAA >60 04/15/2015 0325   GFRAA >60 03/31/2015 0549   GFRAA >60 03/30/2015 0930   CMP     Component Value Date/Time   NA 141 04/15/2015 0325   K 4.0 04/15/2015 0325   CL 105 04/15/2015 0325   CO2 25 04/15/2015 0325   GLUCOSE 123* 04/15/2015 0325   BUN 11 04/15/2015 0325   CREATININE 1.13 04/15/2015 0325   CREATININE 1.03 04/12/2015 1558   CALCIUM 8.3* 04/15/2015 0325   PROT 7.0 05/21/2008  1316   ALBUMIN 3.9 05/21/2008 1316   AST 19 12/26/2012 0829   ALT 26 05/21/2008 1316   ALKPHOS 38* 05/21/2008 1316   BILITOT 0.7 05/21/2008 1316   GFRNONAA >60 04/15/2015 0325   GFRAA >60 04/15/2015 0325       Component Value Date/Time   WBC 8.7 04/15/2015 0325   WBC 6.9 04/12/2015 1558   WBC 8.6 03/31/2015 0549   HGB 10.0* 04/15/2015 0325   HGB 11.1* 04/12/2015 1558   HGB 10.7* 03/31/2015 0549   HCT 30.5* 04/15/2015 0325   HCT 34.3* 04/12/2015 1558   HCT 32.8* 03/31/2015 0549   MCV 90.2 04/15/2015 0325   MCV 88.2 04/12/2015 1558   MCV 90.4 03/31/2015 0549    Lipid Panel     Component Value Date/Time   CHOL 162 12/26/2012 0826   TRIG 199* 12/26/2012 0826   HDL 35* 12/26/2012 0826   CHOLHDL 4.6 12/26/2012 0826   VLDL 40 12/26/2012 0826   LDLCALC 87 12/26/2012 0826    ABG No results found for: PHART, PCO2ART, PO2ART, HCO3, TCO2, ACIDBASEDEF, O2SAT   No results found for: TSH BNP (last 3 results) No results for input(s): BNP in the last 8760 hours.  ProBNP (last 3 results) No results for input(s): PROBNP in the last 8760 hours.  Cardiac Panel (last 3 results) No results for input(s): CKTOTAL, CKMB, TROPONINI, RELINDX in the last 72 hours.  Iron/TIBC/Ferritin/ %Sat No results found for: IRON, TIBC, FERRITIN, IRONPCTSAT   EKG Orders placed or performed during the hospital encounter of 04/14/15  . EKG 12-Lead immediately post procedure  . EKG 12-Lead  . EKG 12-Lead  . EKG 12-Lead  . EKG 12-Lead immediately post procedure  . EKG 12-Lead  . EKG     Prior Assessment and Plan Problem List as of 04/27/2015      Cardiovascular and Mediastinum   CAD S/P RCA DES '05 and 03/11/15   Last Assessment & Plan 10/29/2013 Office Visit Written 10/29/2013  2:10 PM by Imogene Burn, PA-C    Stable without chest pain. Continue Plavix, aspirin and metoprolol.      Essential hypertension   Last Assessment & Plan 10/29/2013 Office Visit Written 10/29/2013  2:12 PM by Imogene Burn, PA-C    Controlled      Chronic total occlusion of LAD s/p CTO/PCI wiht DES 04/14/15     Endocrine  Type 2 diabetes mellitus treated without insulin ([place])     Other   Dyslipidemia   Last Assessment & Plan 10/29/2013 Office Visit Written 10/29/2013  2:13 PM by Imogene Burn, PA-C    Treated by Dr. Luan Pulling      Obesity, morbid, BMI 40.0-49.9 [facility])   Last Assessment & Plan 10/29/2013 Office Visit Written 10/29/2013  2:14 PM by Imogene Burn, PA-C    I had along discussion with the patient concerning his weight gain. Discussed a weight loss program and exercise program. He is willing to try and says he'll be 25 pounds less on his next office visit.          Imaging: No results found.

## 2015-05-27 ENCOUNTER — Encounter (HOSPITAL_COMMUNITY): Payer: Medicare Other

## 2015-06-29 DIAGNOSIS — I10 Essential (primary) hypertension: Secondary | ICD-10-CM | POA: Diagnosis not present

## 2015-06-29 DIAGNOSIS — I25119 Atherosclerotic heart disease of native coronary artery with unspecified angina pectoris: Secondary | ICD-10-CM | POA: Diagnosis not present

## 2015-06-29 DIAGNOSIS — E119 Type 2 diabetes mellitus without complications: Secondary | ICD-10-CM | POA: Diagnosis not present

## 2015-08-02 DIAGNOSIS — I25119 Atherosclerotic heart disease of native coronary artery with unspecified angina pectoris: Secondary | ICD-10-CM | POA: Diagnosis not present

## 2015-08-02 DIAGNOSIS — M545 Low back pain: Secondary | ICD-10-CM | POA: Diagnosis not present

## 2015-08-02 DIAGNOSIS — E119 Type 2 diabetes mellitus without complications: Secondary | ICD-10-CM | POA: Diagnosis not present

## 2015-08-02 DIAGNOSIS — I10 Essential (primary) hypertension: Secondary | ICD-10-CM | POA: Diagnosis not present

## 2015-08-06 ENCOUNTER — Ambulatory Visit (INDEPENDENT_AMBULATORY_CARE_PROVIDER_SITE_OTHER): Payer: Medicare Other | Admitting: Adult Health

## 2015-08-06 ENCOUNTER — Encounter: Payer: Self-pay | Admitting: Adult Health

## 2015-08-06 VITALS — BP 142/84 | HR 73 | Ht 68.5 in | Wt 308.0 lb

## 2015-08-06 DIAGNOSIS — I2510 Atherosclerotic heart disease of native coronary artery without angina pectoris: Secondary | ICD-10-CM | POA: Diagnosis not present

## 2015-08-06 DIAGNOSIS — I10 Essential (primary) hypertension: Secondary | ICD-10-CM | POA: Diagnosis not present

## 2015-08-06 DIAGNOSIS — Z79899 Other long term (current) drug therapy: Secondary | ICD-10-CM

## 2015-08-06 MED ORDER — LOSARTAN POTASSIUM 25 MG PO TABS: 25.0000 mg | ORAL_TABLET | Freq: Every day | ORAL | Status: DC

## 2015-08-06 NOTE — Progress Notes (Signed)
Name: Steven Stevens    DOB: 1945/08/27  Age: 70 y.o.  MR#: [id number]       PCP:  Alonza Bogus, MD      Insurance: Payor: [employer] MEDICARE / Plan: [facility] MEDICARE / Product Type: *No Product type* /   CC:   No chief complaint on file.   VS Filed Vitals:   08/06/15 1543  Pulse: 73  Height: 5' 8.5" (1.74 m)  Weight: 308 lb (139.708 kg)  SpO2: 96%    Weights Current Weight  08/06/15 308 lb (139.708 kg)  04/27/15 305 lb (138.347 kg)  04/15/15 302 lb 14.6 oz (137.4 kg)    Blood Pressure  BP Readings from Last 3 Encounters:  04/27/15 104/76  04/15/15 127/51  03/31/15 123/65     Admit date:  (Not on file) Last encounter with RMR:  04/27/2015   Allergy Review of patient's allergies indicates no known allergies.  Current Outpatient Prescriptions  Medication Sig Dispense Refill  . acetaminophen (TYLENOL) 325 MG tablet Take 2 tablets (650 mg total) by mouth every 4 (four) hours as needed for headache or mild pain.    Marland Kitchen ALPRAZolam (XANAX) 1 MG tablet Take 1 mg by mouth 4 (four) times daily as needed for anxiety.     Marland Kitchen aspirin EC 81 MG tablet Take 81 mg by mouth daily.    Marland Kitchen atorvastatin (LIPITOR) 40 MG tablet Take 1 tablet (40 mg total) by mouth daily. 30 tablet 5  . clopidogrel (PLAVIX) 75 MG tablet Take 1 tablet (75 mg total) by mouth daily. 30 tablet 11  . glipiZIDE-metformin (METAGLIP) 5-500 MG tablet Take 1 tablet by mouth 2 (two) times daily before a meal.    . HYDROcodone-acetaminophen (LORTAB) 10-500 MG per tablet Take 1 tablet by mouth every 6 (six) hours as needed for pain.     . metoprolol tartrate (LOPRESSOR) 25 MG tablet Take 12.5 mg by mouth 2 (two) times daily.    . nitroGLYCERIN (NITROSTAT) 0.4 MG SL tablet Place 1 tablet (0.4 mg total) under the tongue every 5 (five) minutes as needed for chest pain. 25 tablet 2  . ONE TOUCH ULTRA TEST test strip     . sertraline (ZOLOFT) 50 MG tablet Take 50 mg by mouth daily.     Marland Kitchen terazosin (HYTRIN) 5 MG capsule Take 5  mg by mouth at bedtime.     No current facility-administered medications for this visit.    Discontinued Meds:   There are no discontinued medications.  Patient Active Problem List   Diagnosis Date Noted  . Chronic total occlusion of LAD s/p CTO/PCI wiht DES 04/14/15 04/15/2015  . Type 2 diabetes mellitus treated without insulin ([place]) 03/31/2015  . Essential hypertension 10/29/2013  . Obesity, morbid, BMI 40.0-49.9 ([place]) 10/29/2013  . Dyslipidemia 05/31/2012  . CAD S/P RCA DES '05 and 03/11/15 05/31/2012    LABS    Component Value Date/Time   NA 141 04/15/2015 0325   NA 145 04/12/2015 1558   NA 140 03/31/2015 0549   K 4.0 04/15/2015 0325   K 4.4 04/12/2015 1558   K 3.5 03/31/2015 0549   CL 105 04/15/2015 0325   CL 106 04/12/2015 1558   CL 103 03/31/2015 0549   CO2 25 04/15/2015 0325   CO2 27 04/12/2015 1558   CO2 26 03/31/2015 0549   GLUCOSE 123* 04/15/2015 0325   GLUCOSE 81 04/12/2015 1558   GLUCOSE 119* 03/31/2015 0549   BUN 11 04/15/2015 0325   BUN  13 04/12/2015 1558   BUN 9 03/31/2015 0549   CREATININE 1.13 04/15/2015 0325   CREATININE 1.03 04/12/2015 1558   CREATININE 1.11 03/31/2015 0549   CREATININE 1.06 03/30/2015 0930   CREATININE 1.10 03/30/2015 0719   CREATININE 1.25 12/26/2012 0831   CALCIUM 8.3* 04/15/2015 0325   CALCIUM 8.4* 04/12/2015 1558   CALCIUM 8.5* 03/31/2015 0549   GFRNONAA >60 04/15/2015 0325   GFRNONAA >60 03/31/2015 0549   GFRNONAA >60 03/30/2015 0930   GFRAA >60 04/15/2015 0325   GFRAA >60 03/31/2015 0549   GFRAA >60 03/30/2015 0930   CMP     Component Value Date/Time   NA 141 04/15/2015 0325   K 4.0 04/15/2015 0325   CL 105 04/15/2015 0325   CO2 25 04/15/2015 0325   GLUCOSE 123* 04/15/2015 0325   BUN 11 04/15/2015 0325   CREATININE 1.13 04/15/2015 0325   CREATININE 1.03 04/12/2015 1558   CALCIUM 8.3* 04/15/2015 0325   PROT 7.0 05/21/2008 1316   ALBUMIN 3.9 05/21/2008 1316   AST 19 12/26/2012 0829   ALT 26 05/21/2008 1316    ALKPHOS 38* 05/21/2008 1316   BILITOT 0.7 05/21/2008 1316   GFRNONAA >60 04/15/2015 0325   GFRAA >60 04/15/2015 0325       Component Value Date/Time   WBC 8.7 04/15/2015 0325   WBC 6.9 04/12/2015 1558   WBC 8.6 03/31/2015 0549   HGB 10.0* 04/15/2015 0325   HGB 11.1* 04/12/2015 1558   HGB 10.7* 03/31/2015 0549   HCT 30.5* 04/15/2015 0325   HCT 34.3* 04/12/2015 1558   HCT 32.8* 03/31/2015 0549   MCV 90.2 04/15/2015 0325   MCV 88.2 04/12/2015 1558   MCV 90.4 03/31/2015 0549    Lipid Panel     Component Value Date/Time   CHOL 162 12/26/2012 0826   TRIG 199* 12/26/2012 0826   HDL 35* 12/26/2012 0826   CHOLHDL 4.6 12/26/2012 0826   VLDL 40 12/26/2012 0826   LDLCALC 87 12/26/2012 0826    ABG No results found for: PHART, PCO2ART, PO2ART, HCO3, TCO2, ACIDBASEDEF, O2SAT   No results found for: TSH BNP (last 3 results) No results for input(s): BNP in the last 8760 hours.  ProBNP (last 3 results) No results for input(s): PROBNP in the last 8760 hours.  Cardiac Panel (last 3 results) No results for input(s): CKTOTAL, CKMB, TROPONINI, RELINDX in the last 72 hours.  Iron/TIBC/Ferritin/ %Sat No results found for: IRON, TIBC, FERRITIN, IRONPCTSAT   EKG Orders placed or performed during the hospital encounter of 04/14/15  . EKG 12-Lead immediately post procedure  . EKG 12-Lead  . EKG 12-Lead  . EKG 12-Lead  . EKG 12-Lead immediately post procedure  . EKG 12-Lead  . EKG     Prior Assessment and Plan Problem List as of 08/06/2015      Cardiovascular and Mediastinum   CAD S/P RCA DES '05 and 03/11/15   Last Assessment & Plan 10/29/2013 Office Visit Written 10/29/2013  2:10 PM by Imogene Burn, PA-C    Stable without chest pain. Continue Plavix, aspirin and metoprolol.      Essential hypertension   Last Assessment & Plan 10/29/2013 Office Visit Written 10/29/2013  2:12 PM by Imogene Burn, PA-C    Controlled      Chronic total occlusion of LAD s/p CTO/PCI wiht DES  04/14/15     Endocrine   Type 2 diabetes mellitus treated without insulin ([place])     Other   Dyslipidemia  Last Assessment & Plan 10/29/2013 Office Visit Written 10/29/2013  2:13 PM by Imogene Burn, PA-C    Treated by Dr. Luan Pulling      Obesity, morbid, BMI 40.0-49.9 [facility])   Last Assessment & Plan 10/29/2013 Office Visit Written 10/29/2013  2:14 PM by Imogene Burn, PA-C    I had along discussion with the patient concerning his weight gain. Discussed a weight loss program and exercise program. He is willing to try and says he'll be 25 pounds less on his next office visit.          Imaging: No results found.

## 2015-08-06 NOTE — Patient Instructions (Signed)
Your physician wants you to follow-up in: 6 Months. You will receive a reminder letter in the mail two months in advance. If you don't receive a letter, please call our office to schedule the follow-up appointment.  Start Losartan 25 mg Daily  Your physician recommends that you return for lab work in: [place] discussed the importance of regular exercise and recommended that you start or continue a regular exercise program for good health.  If you need a refill on your cardiac medications before your next appointment, please call your pharmacy.  Thank you for choosing [facility]!

## 2015-08-06 NOTE — Progress Notes (Signed)
Cardiology Office Note   Date:  08/06/2015   ID:  KAIYAN LAING, DOB 05-31-1945, MRN [id number]  PCP:  Alonza Bogus, MD  Cardiologist:   Jory Sims, NP   Chief Complaint  Patient presents with  . Coronary Artery Disease      History of Present Illness: Steven Stevens is a 70 y.o. male who presents for ongoing assessment and management of coronary artery disease, status post cardiac catheterization and a current of chest pain, which revealed chronic total occlusion of the mid LAD and 70% distal LAD. The LAD filled from left to left collaterals from her diagonals.There was a patent stent to his right coronary artery and distal RCA stenosis of 90%. The right coronary artery was treated with the promise drug-eluting stent.  The patient returned for staged intervention of his mid LAD, where he received overlapping synergy, drug-eluting stents to the LAD, with 0% residual stenosis on 04/12/2015..he was continued on no antiplatelet therapy with aspirin, Plavix, statin therapy, with Lipitor. It was suggested that he followup for her a sleep study to screen for obstructive sleep apnea.  He was last seen in the office on 04/27/2015 and was feeling well without any cardiac complaints.  He comes today with concerns that his blood pressure has not been well controlled. He had been on losartan in the past, but was taken off of it after having stents placed. He is noticing that his blood pressure is rising to the Q000111Q systolic range. He admits to eating some salty foods.   Past Medical History  Diagnosis Date  . Hypertension   . High cholesterol   . Coronary artery disease   . Myocardial infarct ([place]) 01/27/2004  . Type II diabetes mellitus ([place])   . Chronic lower back pain   . Depression     "lost wife 01/14/2015"    Past Surgical History  Procedure Laterality Date  . Back surgery    . Colonoscopy    . Cataract extraction w/phaco Right 12/03/2012    Procedure: CATARACT  EXTRACTION RIGHT EYE (WITH PHACO) AND INTRAOCULAR LENS PLACEMENT  CDE=2.41;  Surgeon: Elta Guadeloupe [name], MD;  Location: [facility];  Service: Ophthalmology;  Laterality: Right;  . Cataract extraction w/phaco Left 12/17/2012    Procedure: CATARACT EXTRACTION PHACO AND INTRAOCULAR LENS PLACEMENT (IOC);  Surgeon: Elta Guadeloupe [name], MD;  Location: [facility];  Service: Ophthalmology;  Laterality: Left;  CDE:7.82  . Yag laser application Right [id number]    Procedure: YAG LASER APPLICATION;  Surgeon: Elta Guadeloupe [name], MD;  Location: [facility];  Service: Ophthalmology;  Laterality: Right;  . Coronary stent placement  03/30/2015    RCA    DES  . Coronary angioplasty with stent placement  2005  . Cardiac catheterization N/A 03/30/2015    Procedure: Left Heart Cath and Coronary Angiography;  Surgeon: Jettie Booze, MD;  Location: [facility];  Service: Cardiovascular;  Laterality: N/A;  . Cardiac catheterization  03/30/2015    Procedure: Coronary Stent Intervention;  Surgeon: Jettie Booze, MD;  Location: [facility];  Service: Cardiovascular;;  . Cardiac catheterization N/A 04/14/2015    Procedure: Coronary/Bypass Graft CTO Intervention;  Surgeon: Jettie Booze, MD;  Location: [facility];  Service: Cardiovascular;  Laterality: N/A;  . Cardiac catheterization  04/14/2015    Procedure: Left Heart Cath and Coronary Angiography;  Surgeon: Jettie Booze, MD;  Location: [facility];  Service: Cardiovascular;;  . Hernia repair    .  Umbilical hernia repair  1990s  . Knee arthroscopy Left 2012  . Lumbar disc surgery  1995    "herniated disc"     Current Outpatient Prescriptions  Medication Sig Dispense Refill  . acetaminophen (TYLENOL) 325 MG tablet Take 2 tablets (650 mg total) by mouth every 4 (four) hours as needed for headache or mild pain.    Marland Kitchen ALPRAZolam (XANAX) 1 MG tablet Take 1 mg by mouth 4 (four) times daily as needed for anxiety.     Marland Kitchen aspirin EC 81 MG tablet  Take 81 mg by mouth daily.    Marland Kitchen atorvastatin (LIPITOR) 40 MG tablet Take 1 tablet (40 mg total) by mouth daily. 30 tablet 5  . clopidogrel (PLAVIX) 75 MG tablet Take 1 tablet (75 mg total) by mouth daily. 30 tablet 11  . glipiZIDE-metformin (METAGLIP) 5-500 MG tablet Take 1 tablet by mouth 2 (two) times daily before a meal.    . HYDROcodone-acetaminophen (LORTAB) 10-500 MG per tablet Take 1 tablet by mouth every 6 (six) hours as needed for pain.     . metoprolol tartrate (LOPRESSOR) 25 MG tablet Take 12.5 mg by mouth 2 (two) times daily.    . nitroGLYCERIN (NITROSTAT) 0.4 MG SL tablet Place 1 tablet (0.4 mg total) under the tongue every 5 (five) minutes as needed for chest pain. 25 tablet 2  . ONE TOUCH ULTRA TEST test strip     . sertraline (ZOLOFT) 50 MG tablet Take 50 mg by mouth daily.     Marland Kitchen terazosin (HYTRIN) 5 MG capsule Take 5 mg by mouth at bedtime.    Marland Kitchen losartan (COZAAR) 25 MG tablet Take 1 tablet (25 mg total) by mouth daily. 90 tablet 3   No current facility-administered medications for this visit.    Allergies:   Review of patient's allergies indicates no known allergies.    Social History:  The patient  reports that he quit smoking about 11 years ago. His smoking use included Cigarettes. He has a 1 pack-year smoking history. He has never used smokeless tobacco. He reports that he does not drink alcohol or use illicit drugs.   Family History:  The patient's family history is not on file.    ROS: All other systems are reviewed and negative. Unless otherwise mentioned in H&P    PHYSICAL EXAM: VS:  BP 142/84 mmHg  Pulse 73  Ht 5' 8.5" (1.74 m)  Wt 308 lb (139.708 kg)  BMI 46.14 kg/m2  SpO2 96% , BMI Body mass index is 46.14 kg/(m^2). GEN: Well nourished, well developed, in no acute distress HEENT: normal Neck: no JVD, carotid bruits, or masses Cardiac: RRR; no murmurs, rubs, or gallops,no edema  Respiratory:  clear to auscultation bilaterally, normal work of  breathing GI: soft, nontender, nondistended, + BS, Obese MS: no deformity or atrophy Skin: warm and dry, no rash Neuro:  Strength and sensation are intact Psych: euthymic mood, full affect  Recent Labs: 04/15/2015: BUN 11; Creatinine, Ser 1.13; Hemoglobin 10.0*; Platelets 193; Potassium 4.0; Sodium 141    Lipid Panel    Component Value Date/Time   CHOL 162 12/26/2012 0826   TRIG 199* 12/26/2012 0826   HDL 35* 12/26/2012 0826   CHOLHDL 4.6 12/26/2012 0826   VLDL 40 12/26/2012 0826   LDLCALC 87 12/26/2012 0826      Wt Readings from Last 3 Encounters:  08/06/15 308 lb (139.708 kg)  04/27/15 305 lb (138.347 kg)  04/15/15 302 lb 14.6 oz (137.4 kg)  ASSESSMENT AND PLAN:  1.  Coronary artery disease: history of stent to the right coronary artery with chronic total occlusion of the distal LAD. He was continued on aspirin, Plavix, and statin therapy. He offers no complaints of chest discomfort. I've advised him to increase his activity to walking once a day. He states he walks maybe 3 times a week about a mile. Would like him to increase his frequency to assist in stamina and weight loss.  2. Hypertension:blood pressure has been elevated at home in the 140s to 150s over 80s and 90s. Blood pressure today is 142/84. He has a history of acute kidney injury in the setting of a MI. Last creatinine was 1.13. Will start him on losartan 25 mg daily. With followup BMET.  3. Diabetes: he states that his been labile. He continues on glipizide and metformin. Labs are recently been drawn by Dr. Luan Pulling. We'll request results.  Current medicines are reviewed at length with the patient today.    Labs/ tests ordered today include: BMET  Orders Placed This Encounter  Procedures  . Basic Metabolic Panel (BMET)     Disposition:   FU with 1 month.  Signed, Jory Sims, NP  08/06/2015 4:28 PM    [facility] [address], [place], [place] [address] Phone: [phone]; Fax: [phone]

## 2015-09-09 DIAGNOSIS — Z79899 Other long term (current) drug therapy: Secondary | ICD-10-CM | POA: Diagnosis not present

## 2015-09-10 LAB — BASIC METABOLIC PANEL
BUN: 12 mg/dL (ref 7–25)
CALCIUM: 8.7 mg/dL (ref 8.6–10.3)
CO2: 28 mmol/L (ref 20–31)
Chloride: 105 mmol/L (ref 98–110)
Creat: 1.02 mg/dL (ref 0.70–1.25)
Glucose, Bld: 115 mg/dL — ABNORMAL HIGH (ref 65–99)
Potassium: 4.6 mmol/L (ref 3.5–5.3)
SODIUM: 141 mmol/L (ref 135–146)

## 2015-09-20 ENCOUNTER — Telehealth: Payer: Self-pay | Admitting: *Deleted

## 2015-09-20 NOTE — Telephone Encounter (Signed)
Called patient with test results. No answer. Left message to call back.  

## 2015-09-20 NOTE — Telephone Encounter (Signed)
-----   Message from Lendon Colonel, NP sent at 09/20/2015  7:17 AM EDT ----- Labs look good. No changes in regimen

## 2015-09-29 DIAGNOSIS — I10 Essential (primary) hypertension: Secondary | ICD-10-CM | POA: Diagnosis not present

## 2015-09-29 DIAGNOSIS — I25119 Atherosclerotic heart disease of native coronary artery with unspecified angina pectoris: Secondary | ICD-10-CM | POA: Diagnosis not present

## 2015-09-29 DIAGNOSIS — M545 Low back pain: Secondary | ICD-10-CM | POA: Diagnosis not present

## 2015-09-29 DIAGNOSIS — E1165 Type 2 diabetes mellitus with hyperglycemia: Secondary | ICD-10-CM | POA: Diagnosis not present

## 2015-10-05 DIAGNOSIS — Z961 Presence of intraocular lens: Secondary | ICD-10-CM | POA: Diagnosis not present

## 2015-10-05 DIAGNOSIS — H524 Presbyopia: Secondary | ICD-10-CM | POA: Diagnosis not present

## 2015-10-05 DIAGNOSIS — E119 Type 2 diabetes mellitus without complications: Secondary | ICD-10-CM | POA: Diagnosis not present

## 2015-12-10 ENCOUNTER — Telehealth: Payer: Self-pay

## 2015-12-10 NOTE — Telephone Encounter (Signed)
Pt is due for colonoscopy. Can he be triaged or does he need OV prior? Please advise and call his daughter, Jaynee Eagles that works at the [employer] center at [place]

## 2015-12-17 NOTE — Telephone Encounter (Signed)
Tried to call and Steven Stevens unavailable now.

## 2015-12-20 ENCOUNTER — Telehealth: Payer: Self-pay

## 2015-12-20 NOTE — Telephone Encounter (Signed)
Pt is past due his colonoscopy, has family hx of colon cancer. He is on coumadin and is scheduled for OV with Roseanne Kaufman, NP on 12/23/2015 at 9:00 AM.

## 2015-12-23 ENCOUNTER — Ambulatory Visit: Payer: Medicare Other | Admitting: Gastroenterology

## 2015-12-23 NOTE — Telephone Encounter (Signed)
See previous note

## 2016-03-29 DIAGNOSIS — I25119 Atherosclerotic heart disease of native coronary artery with unspecified angina pectoris: Secondary | ICD-10-CM | POA: Diagnosis not present

## 2016-03-29 DIAGNOSIS — I10 Essential (primary) hypertension: Secondary | ICD-10-CM | POA: Diagnosis not present

## 2016-03-29 DIAGNOSIS — N401 Enlarged prostate with lower urinary tract symptoms: Secondary | ICD-10-CM | POA: Diagnosis not present

## 2016-03-29 DIAGNOSIS — E1165 Type 2 diabetes mellitus with hyperglycemia: Secondary | ICD-10-CM | POA: Diagnosis not present

## 2016-05-19 ENCOUNTER — Other Ambulatory Visit: Payer: Self-pay | Admitting: Cardiology

## 2016-06-20 ENCOUNTER — Other Ambulatory Visit: Payer: Self-pay | Admitting: *Deleted

## 2016-06-20 ENCOUNTER — Other Ambulatory Visit: Payer: Self-pay | Admitting: Adult Health

## 2016-06-20 MED ORDER — ATORVASTATIN CALCIUM 40 MG PO TABS: 40.0000 mg | ORAL_TABLET | Freq: Every day | ORAL | 0 refills | Status: DC

## 2016-06-28 DIAGNOSIS — N401 Enlarged prostate with lower urinary tract symptoms: Secondary | ICD-10-CM | POA: Diagnosis not present

## 2016-06-28 DIAGNOSIS — I10 Essential (primary) hypertension: Secondary | ICD-10-CM | POA: Diagnosis not present

## 2016-06-28 DIAGNOSIS — E119 Type 2 diabetes mellitus without complications: Secondary | ICD-10-CM | POA: Diagnosis not present

## 2016-06-28 DIAGNOSIS — Z634 Disappearance and death of family member: Secondary | ICD-10-CM | POA: Diagnosis not present

## 2016-07-20 ENCOUNTER — Encounter: Payer: Self-pay | Admitting: Gastroenterology

## 2016-07-20 ENCOUNTER — Ambulatory Visit (INDEPENDENT_AMBULATORY_CARE_PROVIDER_SITE_OTHER): Payer: Medicare Other | Admitting: Gastroenterology

## 2016-07-20 ENCOUNTER — Other Ambulatory Visit: Payer: Self-pay

## 2016-07-20 DIAGNOSIS — Z634 Disappearance and death of family member: Secondary | ICD-10-CM | POA: Diagnosis not present

## 2016-07-20 DIAGNOSIS — E119 Type 2 diabetes mellitus without complications: Secondary | ICD-10-CM | POA: Diagnosis not present

## 2016-07-20 DIAGNOSIS — N401 Enlarged prostate with lower urinary tract symptoms: Secondary | ICD-10-CM | POA: Diagnosis not present

## 2016-07-20 DIAGNOSIS — Z86010 Personal history of colonic polyps: Secondary | ICD-10-CM

## 2016-07-20 DIAGNOSIS — Z860101 Personal history of adenomatous and serrated colon polyps: Secondary | ICD-10-CM | POA: Insufficient documentation

## 2016-07-20 DIAGNOSIS — Z800 Family history of malignant neoplasm of digestive organs: Secondary | ICD-10-CM | POA: Diagnosis not present

## 2016-07-20 DIAGNOSIS — D649 Anemia, unspecified: Secondary | ICD-10-CM | POA: Insufficient documentation

## 2016-07-20 DIAGNOSIS — I10 Essential (primary) hypertension: Secondary | ICD-10-CM | POA: Diagnosis not present

## 2016-07-20 MED ORDER — PEG 3350-KCL-NA BICARB-NACL 420 G PO SOLR: 4000.0000 mL | ORAL | 0 refills | Status: DC

## 2016-07-20 NOTE — Progress Notes (Signed)
Primary Care Physician:  Sinda Du, MD  Primary Gastroenterologist:  Garfield Cornea, MD   Chief Complaint  Patient presents with  . Colonoscopy    HPI:  Steven Stevens is a 71 y.o. male here to schedule surveillance colonoscopy. He was due in April 2016. personal history of colon polyp. He was delayed in having his procedure done due to family issues and personal medical issues. Lost his wife in 2016. He has a family history of colon cancer, mother deceased, diagnosed in her 2s. He has a personal history of tubular adenomas. He had a colonoscopy in April 2011, left-sided diverticula, splenic flexure and rectal polyp, tubular adenomas.  Clinically he is doing well. He denies any constipation, diarrhea, melena, rectal bleeding. No abdominal pain. No dysphagia, vomiting, heartburn. No significant weight change in the past 7 years.  He is on Plavix and aspirin. Denies Coumadin.  Current Outpatient Prescriptions  Medication Sig Dispense Refill  . acetaminophen (TYLENOL) 325 MG tablet Take 2 tablets (650 mg total) by mouth every 4 (four) hours as needed for headache or mild pain.    Marland Kitchen ALPRAZolam (XANAX) 1 MG tablet Take 1 mg by mouth 4 (four) times daily as needed for anxiety.     Marland Kitchen aspirin EC 81 MG tablet Take 81 mg by mouth daily.    Marland Kitchen atorvastatin (LIPITOR) 40 MG tablet TAKE 1 TABLET BY MOUTH DAILY 30 tablet 0  . clopidogrel (PLAVIX) 75 MG tablet Take 1 tablet (75 mg total) by mouth daily. 30 tablet 11  . glipiZIDE-metformin (METAGLIP) 5-500 MG tablet Take 1 tablet by mouth 2 (two) times daily before a meal.    . HYDROcodone-acetaminophen (LORTAB) 10-500 MG per tablet Take 1 tablet by mouth every 6 (six) hours as needed for pain.     Marland Kitchen losartan (COZAAR) 25 MG tablet Take 1 tablet (25 mg total) by mouth daily. 90 tablet 3  . metoprolol tartrate (LOPRESSOR) 25 MG tablet Take 12.5 mg by mouth 2 (two) times daily.    . nitroGLYCERIN (NITROSTAT) 0.4 MG SL tablet Place 1 tablet (0.4 mg  total) under the tongue every 5 (five) minutes as needed for chest pain. 25 tablet 2  . ONE TOUCH ULTRA TEST test strip     . sertraline (ZOLOFT) 50 MG tablet Take 50 mg by mouth daily.     Marland Kitchen terazosin (HYTRIN) 5 MG capsule Take 5 mg by mouth at bedtime.     No current facility-administered medications for this visit.     Allergies as of 07/20/2016  . (No Known Allergies)    Past Medical History:  Diagnosis Date  . Chronic lower back pain   . Coronary artery disease   . Depression    "lost wife 01/14/2015"  . High cholesterol   . Hypertension   . Myocardial infarct ([place]) 01/27/2004  . Type II diabetes mellitus ([place])     Past Surgical History:  Procedure Laterality Date  . BACK SURGERY    . CARDIAC CATHETERIZATION N/A 03/30/2015   Procedure: Left Heart Cath and Coronary Angiography;  Surgeon: Jettie Booze, MD;  Location: [facility];  Service: Cardiovascular;  Laterality: N/A;  . CARDIAC CATHETERIZATION  03/30/2015   Procedure: Coronary Stent Intervention;  Surgeon: Jettie Booze, MD;  Location: [facility];  Service: Cardiovascular;;  . CARDIAC CATHETERIZATION N/A 04/14/2015   Procedure: Coronary/Bypass Graft CTO Intervention;  Surgeon: Jettie Booze, MD;  Location: [facility];  Service: Cardiovascular;  Laterality:  N/A;  . CARDIAC CATHETERIZATION  04/14/2015   Procedure: Left Heart Cath and Coronary Angiography;  Surgeon: Jettie Booze, MD;  Location: [facility];  Service: Cardiovascular;;  . CATARACT EXTRACTION W/PHACO Right 12/03/2012   Procedure: CATARACT EXTRACTION RIGHT EYE (WITH PHACO) AND INTRAOCULAR LENS PLACEMENT  CDE=2.41;  Surgeon: Elta Guadeloupe [name], MD;  Location: [facility];  Service: Ophthalmology;  Laterality: Right;  . CATARACT EXTRACTION W/PHACO Left 12/17/2012   Procedure: CATARACT EXTRACTION PHACO AND INTRAOCULAR LENS PLACEMENT (IOC);  Surgeon: Elta Guadeloupe [name], MD;  Location: [facility];  Service: Ophthalmology;   Laterality: Left;  CDE:7.82  . COLONOSCOPY    . CORONARY ANGIOPLASTY WITH STENT PLACEMENT  2005  . CORONARY STENT PLACEMENT  03/30/2015   RCA    DES  . HERNIA REPAIR    . KNEE ARTHROSCOPY Left 2012  . [place]   "herniated disc"  . UMBILICAL HERNIA REPAIR  1990s  . YAG LASER APPLICATION Right 66/29/4765   Procedure: YAG LASER APPLICATION;  Surgeon: Elta Guadeloupe [name], MD;  Location: [facility];  Service: Ophthalmology;  Laterality: Right;    Family History  Problem Relation Age of Onset  . Colon cancer Mother        deceased age 27, diagnosed with CRC in 3s.    Social History   Social History  . Marital status: Widowed    Spouse name: N/A  . Number of children: N/A  . Years of education: N/A   Occupational History  . Not on file.   Social History Main Topics  . Smoking status: Former Smoker    Packs/day: 0.10    Years: 10.00    Types: Cigarettes    Quit date: 01/31/2004  . Smokeless tobacco: Never Used  . Alcohol use No  . Drug use: No  . Sexual activity: No   Other Topics Concern  . Not on file   Social History Narrative  . No narrative on file      ROS:  General: Negative for anorexia, weight loss, fever, chills, fatigue, weakness. Eyes: Negative for vision changes.  ENT: Negative for hoarseness, difficulty swallowing , nasal congestion. CV: Negative for chest pain, angina, palpitations, dyspnea on exertion, peripheral edema.  Respiratory: Negative for dyspnea at rest, dyspnea on exertion, cough, sputum, wheezing.  GI: See history of present illness. GU:  Negative for dysuria, hematuria, urinary incontinence, urinary frequency, nocturnal urination.  MS: Negative for joint pain.Chronic low back pain.  Derm: Negative for rash or itching.  Neuro: Negative for weakness, abnormal sensation, seizure, frequent headaches, memory loss, confusion.  Psych: Negative for anxiety, depression, suicidal ideation, hallucinations.  Endo: Negative for unusual  weight change.  Heme: Negative for bruising or bleeding. Allergy: Negative for rash or hives.    Physical Examination:  BP (!) 141/82   Pulse 71   Temp 97.9 F (36.6 C) (Oral)   Ht 5\' 8"  (1.727 m)   Wt (!) 319 lb 3.2 oz (144.8 kg)   BMI 48.53 kg/m    General: Well-nourished, well-developed in no acute distress.  Head: Normocephalic, atraumatic.   Eyes: Conjunctiva pink, no icterus. Mouth: Oropharyngeal mucosa moist and pink , no lesions erythema or exudate. Neck: Supple without thyromegaly, masses, or lymphadenopathy.  Lungs: Clear to auscultation bilaterally.  Heart: Regular rate and rhythm, no murmurs rubs or gallops.  Abdomen: Bowel sounds are normal, nontender, nondistended, no hepatosplenomegaly or masses, no abdominal bruits or    hernia , no rebound or guarding.  Obese, limits exam Rectal: Deferred Extremities: No lower extremity edema. No clubbing or deformities.  Neuro: Alert and oriented x 4 , grossly normal neurologically.  Skin: Warm and dry, no rash or jaundice.   Psych: Alert and cooperative, normal mood and affect.  Labs: Lab Results  Component Value Date   CREATININE 1.02 09/09/2015   BUN 12 09/09/2015   NA 141 09/09/2015   K 4.6 09/09/2015   CL 105 09/09/2015   CO2 28 09/09/2015   Lab Results  Component Value Date   ALT 26 05/21/2008   AST 19 12/26/2012   ALKPHOS 38 (L) 05/21/2008   BILITOT 0.7 05/21/2008   Lab Results  Component Value Date   WBC 8.7 04/15/2015   HGB 10.0 (L) 04/15/2015   HCT 30.5 (L) 04/15/2015   MCV 90.2 04/15/2015   PLT 193 04/15/2015     Imaging Studies: No results found.

## 2016-07-20 NOTE — Patient Instructions (Signed)
1. Colonoscopy as scheduled. See separate instructions.  

## 2016-07-20 NOTE — Assessment & Plan Note (Addendum)
71 year old gentleman presenting to schedule his surveillance colonoscopy. He was due back in 2016. He has a personal history of tubular adenomas, family history of colon cancer at an advanced age. Clinically he is doing well from a GI standpoint. No recent labs available to me but he had normocytic anemia in early 2017 when he was undergoing multiple cardiac catheterizations.  Colonoscopy in the near future. Plan for deep sedation in the OR given polypharmacy.  I have discussed the risks, alternatives, benefits with regards to but not limited to the risk of reaction to medication, bleeding, infection, perforation and the patient is agreeable to proceed. Written consent to be obtained.  He will likely have CBC done at preop, if not we will do day of his procedure.

## 2016-07-20 NOTE — Patient Instructions (Signed)
Submitted PA info for colonoscopy via [facility] website. No PA needed. Decision ID# [id number].

## 2016-07-20 NOTE — Progress Notes (Signed)
CC'ED TO PCP 

## 2016-07-20 NOTE — Patient Instructions (Addendum)
Your procedure is scheduled on: 07/24/2016  Report to [facility] at  6:15   AM.  Call this number if you have problems the morning of surgery: [phone]   Remember:   Follow instructions on letter from the office  :  Take these medicines the morning of surgery with A SIP OF WATER: losartan, metoprolol, zoloft, hytrin, hydrocodone if needed   Do not wear jewelry, make-up or nail polish.  Do not wear lotions, powders, or perfumes. You may wear deodorant. .  Do not bring valuables to the hospital.  Contacts, dentures or bridgework may not be worn into surgery.  Leave suitcase in the car. After surgery it may be brought to your room.  For patients admitted to the hospital, checkout time is 11:00 AM the day of discharge.   Patients discharged the day of surgery will not be allowed to drive home.  Colonoscopy, Adult, Care After This sheet gives you information about how to care for yourself after your procedure. Your health care provider may also give you more specific instructions. If you have problems or questions, contact your health care provider. What can I expect after the procedure? After the procedure, it is common to have:  A small amount of blood in your stool for 24 hours after the procedure.  Some gas.  Mild abdominal cramping or bloating.  Follow these instructions at home: General instructions   For the first 24 hours after the procedure: ? Do not drive or use machinery. ? Do not sign important documents. ? Do not drink alcohol. ? Do your regular daily activities at a slower pace than normal. ? Eat soft, easy-to-digest foods. ? Rest often.  Take over-the-counter or prescription medicines only as told by your health care provider.  It is up to you to get the results of your procedure. Ask your health care provider, or the department performing the procedure, when your results will be ready. Relieving cramping and bloating  Try walking around when you have  cramps or feel bloated.  Apply heat to your abdomen as told by your health care provider. Use a heat source that your health care provider recommends, such as a moist heat pack or a heating pad. ? Place a towel between your skin and the heat source. ? Leave the heat on for 20-30 minutes. ? Remove the heat if your skin turns bright red. This is especially important if you are unable to feel pain, heat, or cold. You may have a greater risk of getting burned. Eating and drinking  Drink enough fluid to keep your urine clear or pale yellow.  Resume your normal diet as instructed by your health care provider. Avoid heavy or fried foods that are hard to digest.  Avoid drinking alcohol for as long as instructed by your health care provider. Contact a health care provider if:  You have blood in your stool 2-3 days after the procedure. Get help right away if:  You have more than a small spotting of blood in your stool.  You pass large blood clots in your stool.  Your abdomen is swollen.  You have nausea or vomiting.  You have a fever.  You have increasing abdominal pain that is not relieved with medicine. This information is not intended to replace advice given to you by your health care provider. Make sure you discuss any questions you have with your health care provider. Document Released: 09/07/2003 Document Revised: 10/18/2015 Document Reviewed: 04/06/2015 Elsevier Interactive Patient Education  2018 [place].

## 2016-07-21 ENCOUNTER — Other Ambulatory Visit: Payer: Self-pay | Admitting: Adult Health

## 2016-07-21 ENCOUNTER — Encounter (HOSPITAL_COMMUNITY)
Admission: RE | Admit: 2016-07-21 | Discharge: 2016-07-21 | Disposition: A | Discharge status: Home / Self Care | Payer: Medicare Other | Source: Ambulatory Visit | Attending: Internal Medicine | Admitting: Internal Medicine

## 2016-07-21 DIAGNOSIS — Z7982 Long term (current) use of aspirin: Secondary | ICD-10-CM | POA: Diagnosis not present

## 2016-07-21 DIAGNOSIS — I252 Old myocardial infarction: Secondary | ICD-10-CM | POA: Diagnosis not present

## 2016-07-21 DIAGNOSIS — Z87891 Personal history of nicotine dependence: Secondary | ICD-10-CM | POA: Diagnosis not present

## 2016-07-21 DIAGNOSIS — D122 Benign neoplasm of ascending colon: Secondary | ICD-10-CM | POA: Diagnosis not present

## 2016-07-21 DIAGNOSIS — D123 Benign neoplasm of transverse colon: Secondary | ICD-10-CM | POA: Diagnosis not present

## 2016-07-21 DIAGNOSIS — Z6841 Body Mass Index (BMI) 40.0 and over, adult: Secondary | ICD-10-CM | POA: Diagnosis not present

## 2016-07-21 DIAGNOSIS — I2510 Atherosclerotic heart disease of native coronary artery without angina pectoris: Secondary | ICD-10-CM | POA: Diagnosis not present

## 2016-07-21 DIAGNOSIS — K5730 Diverticulosis of large intestine without perforation or abscess without bleeding: Secondary | ICD-10-CM | POA: Diagnosis not present

## 2016-07-21 DIAGNOSIS — E119 Type 2 diabetes mellitus without complications: Secondary | ICD-10-CM | POA: Diagnosis not present

## 2016-07-21 DIAGNOSIS — Z1211 Encounter for screening for malignant neoplasm of colon: Secondary | ICD-10-CM | POA: Diagnosis not present

## 2016-07-21 DIAGNOSIS — E7800 Pure hypercholesterolemia, unspecified: Secondary | ICD-10-CM | POA: Diagnosis not present

## 2016-07-21 DIAGNOSIS — I10 Essential (primary) hypertension: Secondary | ICD-10-CM | POA: Diagnosis not present

## 2016-07-21 DIAGNOSIS — Z7984 Long term (current) use of oral hypoglycemic drugs: Secondary | ICD-10-CM | POA: Diagnosis not present

## 2016-07-21 DIAGNOSIS — Z955 Presence of coronary angioplasty implant and graft: Secondary | ICD-10-CM | POA: Diagnosis not present

## 2016-07-21 DIAGNOSIS — Z7902 Long term (current) use of antithrombotics/antiplatelets: Secondary | ICD-10-CM | POA: Diagnosis not present

## 2016-07-21 DIAGNOSIS — Z79899 Other long term (current) drug therapy: Secondary | ICD-10-CM | POA: Diagnosis not present

## 2016-07-21 DIAGNOSIS — Z800 Family history of malignant neoplasm of digestive organs: Secondary | ICD-10-CM | POA: Diagnosis not present

## 2016-07-21 DIAGNOSIS — F329 Major depressive disorder, single episode, unspecified: Secondary | ICD-10-CM | POA: Diagnosis not present

## 2016-07-21 DIAGNOSIS — Z8719 Personal history of other diseases of the digestive system: Secondary | ICD-10-CM | POA: Diagnosis not present

## 2016-07-21 DIAGNOSIS — Z86010 Personal history of colonic polyps: Secondary | ICD-10-CM | POA: Diagnosis not present

## 2016-07-21 LAB — BASIC METABOLIC PANEL
ANION GAP: 6 (ref 5–15)
BUN: 12 mg/dL (ref 6–20)
CALCIUM: 8.9 mg/dL (ref 8.9–10.3)
CO2: 31 mmol/L (ref 22–32)
Chloride: 102 mmol/L (ref 101–111)
Creatinine, Ser: 1.1 mg/dL (ref 0.61–1.24)
GFR calc Af Amer: >60 mL/min (ref >60)
GLUCOSE: 96 mg/dL (ref 65–99)
Potassium: 4.2 mmol/L (ref 3.5–5.1)
Sodium: 139 mmol/L (ref 135–145)

## 2016-07-21 LAB — CBC
HCT: 37.7 % — ABNORMAL LOW (ref 39.0–52.0)
Hemoglobin: 12.2 g/dL — ABNORMAL LOW (ref 13.0–17.0)
MCH: 29.7 pg (ref 26.0–34.0)
MCHC: 32.4 g/dL (ref 30.0–36.0)
MCV: 91.7 fL (ref 78.0–100.0)
PLATELETS: 235 10*3/uL (ref 150–400)
RBC: 4.11 MIL/uL — ABNORMAL LOW (ref 4.22–5.81)
RDW: 14.5 % (ref 11.5–15.5)
WBC: 7.8 10*3/uL (ref 4.0–10.5)

## 2016-07-24 ENCOUNTER — Ambulatory Visit (HOSPITAL_COMMUNITY)
Admission: RE | Admit: 2016-07-24 | Discharge: 2016-07-24 | Disposition: A | Discharge status: Home / Self Care | Payer: Medicare Other | Source: Ambulatory Visit | Attending: Internal Medicine | Admitting: Internal Medicine

## 2016-07-24 ENCOUNTER — Ambulatory Visit (HOSPITAL_COMMUNITY): Payer: Medicare Other | Admitting: Anesthesiology

## 2016-07-24 ENCOUNTER — Encounter (HOSPITAL_COMMUNITY)
Admission: RE | Disposition: A | Discharge status: Home / Self Care | Payer: Self-pay | Source: Ambulatory Visit | Attending: Internal Medicine

## 2016-07-24 ENCOUNTER — Encounter (HOSPITAL_COMMUNITY): Payer: Self-pay | Admitting: Anesthesiology

## 2016-07-24 DIAGNOSIS — E7800 Pure hypercholesterolemia, unspecified: Secondary | ICD-10-CM | POA: Diagnosis not present

## 2016-07-24 DIAGNOSIS — Z86010 Personal history of colonic polyps: Secondary | ICD-10-CM | POA: Diagnosis not present

## 2016-07-24 DIAGNOSIS — Z7902 Long term (current) use of antithrombotics/antiplatelets: Secondary | ICD-10-CM | POA: Diagnosis not present

## 2016-07-24 DIAGNOSIS — I2510 Atherosclerotic heart disease of native coronary artery without angina pectoris: Secondary | ICD-10-CM | POA: Insufficient documentation

## 2016-07-24 DIAGNOSIS — Z955 Presence of coronary angioplasty implant and graft: Secondary | ICD-10-CM | POA: Insufficient documentation

## 2016-07-24 DIAGNOSIS — Z87891 Personal history of nicotine dependence: Secondary | ICD-10-CM | POA: Diagnosis not present

## 2016-07-24 DIAGNOSIS — Z7982 Long term (current) use of aspirin: Secondary | ICD-10-CM | POA: Insufficient documentation

## 2016-07-24 DIAGNOSIS — E119 Type 2 diabetes mellitus without complications: Secondary | ICD-10-CM | POA: Insufficient documentation

## 2016-07-24 DIAGNOSIS — Z7984 Long term (current) use of oral hypoglycemic drugs: Secondary | ICD-10-CM | POA: Insufficient documentation

## 2016-07-24 DIAGNOSIS — I252 Old myocardial infarction: Secondary | ICD-10-CM | POA: Insufficient documentation

## 2016-07-24 DIAGNOSIS — Z8719 Personal history of other diseases of the digestive system: Secondary | ICD-10-CM | POA: Diagnosis not present

## 2016-07-24 DIAGNOSIS — F329 Major depressive disorder, single episode, unspecified: Secondary | ICD-10-CM | POA: Insufficient documentation

## 2016-07-24 DIAGNOSIS — Z79899 Other long term (current) drug therapy: Secondary | ICD-10-CM | POA: Diagnosis not present

## 2016-07-24 DIAGNOSIS — K5730 Diverticulosis of large intestine without perforation or abscess without bleeding: Secondary | ICD-10-CM | POA: Diagnosis not present

## 2016-07-24 DIAGNOSIS — Z1211 Encounter for screening for malignant neoplasm of colon: Secondary | ICD-10-CM | POA: Diagnosis not present

## 2016-07-24 DIAGNOSIS — D122 Benign neoplasm of ascending colon: Secondary | ICD-10-CM | POA: Diagnosis not present

## 2016-07-24 DIAGNOSIS — Z800 Family history of malignant neoplasm of digestive organs: Secondary | ICD-10-CM | POA: Diagnosis not present

## 2016-07-24 DIAGNOSIS — I10 Essential (primary) hypertension: Secondary | ICD-10-CM | POA: Diagnosis not present

## 2016-07-24 DIAGNOSIS — Z6841 Body Mass Index (BMI) 40.0 and over, adult: Secondary | ICD-10-CM | POA: Insufficient documentation

## 2016-07-24 DIAGNOSIS — D123 Benign neoplasm of transverse colon: Secondary | ICD-10-CM | POA: Diagnosis not present

## 2016-07-24 HISTORY — PX: COLONOSCOPY WITH PROPOFOL: SHX5780

## 2016-07-24 HISTORY — PX: POLYPECTOMY: SHX5525

## 2016-07-24 LAB — GLUCOSE, CAPILLARY
GLUCOSE-CAPILLARY: 128 mg/dL — AB (ref 65–99)
GLUCOSE-CAPILLARY: 106 mg/dL — AB (ref 65–99)

## 2016-07-24 SURGERY — COLONOSCOPY WITH PROPOFOL
Anesthesia: Monitor Anesthesia Care

## 2016-07-24 MED ORDER — LACTATED RINGERS IV SOLN
INTRAVENOUS | Status: DC
  Administered 2016-07-24: 07:00:00 via INTRAVENOUS

## 2016-07-24 MED ORDER — PROPOFOL 10 MG/ML IV BOLUS
INTRAVENOUS | Status: AC
  Filled 2016-07-24: qty 20

## 2016-07-24 MED ORDER — MIDAZOLAM HCL 2 MG/2ML IJ SOLN
INTRAMUSCULAR | Status: AC
Start: 2016-07-24 — End: 2016-07-24
  Filled 2016-07-24: qty 2

## 2016-07-24 MED ORDER — FENTANYL CITRATE (PF) 100 MCG/2ML IJ SOLN
INTRAMUSCULAR | Status: AC
  Filled 2016-07-24: qty 2

## 2016-07-24 MED ORDER — PROPOFOL 10 MG/ML IV BOLUS
INTRAVENOUS | Status: AC
  Filled 2016-07-24: qty 40

## 2016-07-24 MED ORDER — MIDAZOLAM HCL 2 MG/2ML IJ SOLN
1.0000 mg | INTRAMUSCULAR | Status: AC
  Administered 2016-07-24: 2 mg via INTRAVENOUS

## 2016-07-24 MED ORDER — FENTANYL CITRATE (PF) 100 MCG/2ML IJ SOLN
25.0000 ug | Freq: Once | INTRAMUSCULAR | Status: AC
  Administered 2016-07-24: 25 ug via INTRAVENOUS

## 2016-07-24 MED ORDER — SUCCINYLCHOLINE CHLORIDE 20 MG/ML IJ SOLN
INTRAMUSCULAR | Status: AC
  Filled 2016-07-24: qty 1

## 2016-07-24 MED ORDER — GLYCOPYRROLATE 0.2 MG/ML IJ SOLN
0.2000 mg | Freq: Once | INTRAMUSCULAR | Status: AC | PRN
  Administered 2016-07-24: 0.2 mg via INTRAVENOUS

## 2016-07-24 MED ORDER — ARTIFICIAL TEARS OPHTHALMIC OINT
TOPICAL_OINTMENT | OPHTHALMIC | Status: AC
  Filled 2016-07-24: qty 7

## 2016-07-24 MED ORDER — PROPOFOL 500 MG/50ML IV EMUL
INTRAVENOUS | Status: DC | PRN
  Administered 2016-07-24: 100 ug/kg/min via INTRAVENOUS

## 2016-07-24 MED ORDER — CHLORHEXIDINE GLUCONATE CLOTH 2 % EX PADS: 6.0000 | MEDICATED_PAD | Freq: Once | CUTANEOUS | Status: DC

## 2016-07-24 MED ORDER — MIDAZOLAM HCL 2 MG/2ML IJ SOLN
INTRAMUSCULAR | Status: AC
  Filled 2016-07-24: qty 2

## 2016-07-24 MED ORDER — GLYCOPYRROLATE 0.2 MG/ML IJ SOLN
INTRAMUSCULAR | Status: AC
  Filled 2016-07-24: qty 1

## 2016-07-24 NOTE — Op Note (Signed)
[facility] Patient Name: Steven Stevens Procedure Date: 07/24/2016 7:19 AM MRN: [id number] Date of Birth: Jun 18, 1945 Attending MD: Norvel Richards , MD CSN: [id number] Age: 71 Admit Type: Outpatient Procedure:                Colonoscopy Indications:              High risk colon cancer surveillance: Personal                            history of colonic polyps Providers:                Norvel Richards, MD, Janeece Riggers, RN, Randa Spike, Technician Referring MD:              Medicines:                Propofol per Anesthesia Complications:            No immediate complications. Estimated Blood Loss:     Estimated blood loss was minimal. Procedure:                Pre-Anesthesia Assessment:                           - Prior to the procedure, a History and Physical                            was performed, and patient medications and                            allergies were reviewed. The patient's tolerance of                            previous anesthesia was also reviewed. The risks                            and benefits of the procedure and the sedation                            options and risks were discussed with the patient.                            All questions were answered, and informed consent                            was obtained. Prior Anticoagulants: The patient has                            taken no previous anticoagulant or antiplatelet                            agents. ASA Grade Assessment: III - A patient with  severe systemic disease. After reviewing the risks                            and benefits, the patient was deemed in                            satisfactory condition to undergo the procedure.                           After obtaining informed consent, the colonoscope                            was passed under direct vision. Throughout the                            procedure, the  patient's blood pressure, pulse, and                            oxygen saturations were monitored continuously. The                            EC-[id number] ([id number]) scope was introduced through                            the and advanced to the the cecum, identified by                            appendiceal orifice and ileocecal valve. The                            colonoscopy was performed without difficulty. The                            patient tolerated the procedure well. The quality                            of the bowel preparation was adequate. The                            ileocecal valve, appendiceal orifice, and rectum                            were photographed. Scope In: 7:41:31 AM Scope Out: 7:57:25 AM Scope Withdrawal Time: 0 hours 11 minutes 1 second  Total Procedure Duration: 0 hours 15 minutes 54 seconds  Findings:      The perianal and digital rectal examinations were normal.      Two semi-pedunculated polyps were found in the splenic flexure. The       polyps were 4 to 6 mm in size. These polyps were removed with a cold       snare. Resection and retrieval were complete. Estimated blood loss was       minimal.      A 6 mm polyp was found in the ascending colon. The polyp was       semi-pedunculated. The  polyp was removed with a cold snare. Resection       and retrieval were complete. Estimated blood loss was minimal.      Scattered small and large-mouthed diverticula were found in the entire       colon.      The exam was otherwise without abnormality on direct and retroflexion       views. Impression:               - Two 4 to 6 mm polyps at the splenic flexure,                            removed with a cold snare. Resected and retrieved.                           - One 6 mm polyp in the ascending colon, removed                            with a cold snare. Resected and retrieved.                           - Diverticulosis in the entire examined colon.                            - The examination was otherwise normal on direct                            and retroflexion views. Moderate Sedation:      Moderate (conscious) sedation was personally administered by an       anesthesia professional. The following parameters were monitored: oxygen       saturation, heart rate, blood pressure, respiratory rate, EKG, adequacy       of pulmonary ventilation, and response to care. Total physician       intraservice time was 23 minutes. Recommendation:           - Patient has a contact number available for                            emergencies. The signs and symptoms of potential                            delayed complications were discussed with the                            patient. Return to normal activities tomorrow.                            Written discharge instructions were provided to the                            patient.                           - Resume previous diet.                           -  Continue present medications.                           - Await pathology results.                           - Repeat colonoscopy date to be determined after                            pending pathology results are reviewed for                            surveillance based on pathology results.                           - Return to GI clinic (date not yet determined). Procedure Code(s):        --- Professional ---                           [phone], Colonoscopy, flexible; with removal of                            tumor(s), polyp(s), or other lesion(s) by snare                            technique Diagnosis Code(s):        --- Professional ---                           Z86.010, Personal history of colonic polyps                           D12.3, Benign neoplasm of transverse colon (hepatic                            flexure or splenic flexure)                           D12.2, Benign neoplasm of ascending colon                           K57.30,  Diverticulosis of large intestine without                            perforation or abscess without bleeding CPT copyright 2016 [employer]. All rights reserved. The codes documented in this report are preliminary and upon coder review may  be revised to meet current compliance requirements. Cristopher Estimable. Conroy Goracke, MD Norvel Richards, MD 07/24/2016 8:07:13 AM This report has been signed electronically. Number of Addenda: 0

## 2016-07-24 NOTE — Interval H&P Note (Signed)
History and Physical Interval Note:  07/24/2016 7:18 AM  Steven Stevens  has presented today for surgery, with the diagnosis of family history colorectal cancer, history of colon adenoma  The various methods of treatment have been discussed with the patient and family. After consideration of risks, benefits and other options for treatment, the patient has consented to  Procedure(s) with comments: COLONOSCOPY WITH PROPOFOL (N/A) - 7:30am as a surgical intervention .  The patient's history has been reviewed, patient examined, no change in status, stable for surgery.  I have reviewed the patient's chart and labs.  Questions were answered to the patient's satisfaction.     Steven Stevens  No change; tcs per plan.   The risks, benefits, limitations, alternatives and imponderables have been reviewed with the patient. Questions have been answered. All parties are agreeable.

## 2016-07-24 NOTE — Anesthesia Procedure Notes (Signed)
Procedure Name: MAC Date/Time: 07/24/2016 7:31 AM Performed by: Andree Elk, AMY A Pre-anesthesia Checklist: Patient identified, Emergency Drugs available, Suction available and Patient being monitored Patient Re-evaluated:Patient Re-evaluated prior to inductionOxygen Delivery Method: Simple face mask

## 2016-07-24 NOTE — Anesthesia Postprocedure Evaluation (Signed)
Anesthesia Post Note  Patient: Steven Stevens  Procedure(s) Performed: Procedure(s) (LRB): COLONOSCOPY WITH PROPOFOL (N/A) POLYPECTOMY  Patient location during evaluation: PACU Anesthesia Type: MAC Level of consciousness: awake and alert and oriented Pain management: pain level controlled Vital Signs Assessment: post-procedure vital signs reviewed and stable Respiratory status: spontaneous breathing and patient connected to nasal cannula oxygen Cardiovascular status: stable Postop Assessment: no signs of nausea or vomiting Anesthetic complications: no     Last Vitals:  Vitals:   07/24/16 0651  Pulse: 63  Resp: 14  Temp: 36.9 C    Last Pain:  Vitals:   07/24/16 0651  TempSrc: Oral                 Ruth Tully A

## 2016-07-24 NOTE — H&P (View-Only) (Signed)
Primary Care Physician:  Sinda Du, MD  Primary Gastroenterologist:  Garfield Cornea, MD   Chief Complaint  Patient presents with  . Colonoscopy    HPI:  Steven Stevens is a 71 y.o. male here to schedule surveillance colonoscopy. He was due in April 2016. personal history of colon polyp. He was delayed in having his procedure done due to family issues and personal medical issues. Lost his wife in 2016. He has a family history of colon cancer, mother deceased, diagnosed in her 3s. He has a personal history of tubular adenomas. He had a colonoscopy in April 2011, left-sided diverticula, splenic flexure and rectal polyp, tubular adenomas.  Clinically he is doing well. He denies any constipation, diarrhea, melena, rectal bleeding. No abdominal pain. No dysphagia, vomiting, heartburn. No significant weight change in the past 7 years.  He is on Plavix and aspirin. Denies Coumadin.  Current Outpatient Prescriptions  Medication Sig Dispense Refill  . acetaminophen (TYLENOL) 325 MG tablet Take 2 tablets (650 mg total) by mouth every 4 (four) hours as needed for headache or mild pain.    Marland Kitchen ALPRAZolam (XANAX) 1 MG tablet Take 1 mg by mouth 4 (four) times daily as needed for anxiety.     Marland Kitchen aspirin EC 81 MG tablet Take 81 mg by mouth daily.    Marland Kitchen atorvastatin (LIPITOR) 40 MG tablet TAKE 1 TABLET BY MOUTH DAILY 30 tablet 0  . clopidogrel (PLAVIX) 75 MG tablet Take 1 tablet (75 mg total) by mouth daily. 30 tablet 11  . glipiZIDE-metformin (METAGLIP) 5-500 MG tablet Take 1 tablet by mouth 2 (two) times daily before a meal.    . HYDROcodone-acetaminophen (LORTAB) 10-500 MG per tablet Take 1 tablet by mouth every 6 (six) hours as needed for pain.     Marland Kitchen losartan (COZAAR) 25 MG tablet Take 1 tablet (25 mg total) by mouth daily. 90 tablet 3  . metoprolol tartrate (LOPRESSOR) 25 MG tablet Take 12.5 mg by mouth 2 (two) times daily.    . nitroGLYCERIN (NITROSTAT) 0.4 MG SL tablet Place 1 tablet (0.4 mg  total) under the tongue every 5 (five) minutes as needed for chest pain. 25 tablet 2  . ONE TOUCH ULTRA TEST test strip     . sertraline (ZOLOFT) 50 MG tablet Take 50 mg by mouth daily.     Marland Kitchen terazosin (HYTRIN) 5 MG capsule Take 5 mg by mouth at bedtime.     No current facility-administered medications for this visit.     Allergies as of 07/20/2016  . (No Known Allergies)    Past Medical History:  Diagnosis Date  . Chronic lower back pain   . Coronary artery disease   . Depression    "lost wife 01/14/2015"  . High cholesterol   . Hypertension   . Myocardial infarct ([place]) 01/27/2004  . Type II diabetes mellitus ([place])     Past Surgical History:  Procedure Laterality Date  . BACK SURGERY    . CARDIAC CATHETERIZATION N/A 03/30/2015   Procedure: Left Heart Cath and Coronary Angiography;  Surgeon: Jettie Booze, MD;  Location: [place] [facility];  Service: Cardiovascular;  Laterality: N/A;  . CARDIAC CATHETERIZATION  03/30/2015   Procedure: Coronary Stent Intervention;  Surgeon: Jettie Booze, MD;  Location: [place] [facility];  Service: Cardiovascular;;  . CARDIAC CATHETERIZATION N/A 04/14/2015   Procedure: Coronary/Bypass Graft CTO Intervention;  Surgeon: Jettie Booze, MD;  Location: [place] [facility];  Service: Cardiovascular;  Laterality:  N/A;  . CARDIAC CATHETERIZATION  04/14/2015   Procedure: Left Heart Cath and Coronary Angiography;  Surgeon: Jettie Booze, MD;  Location: [place] [facility];  Service: Cardiovascular;;  . CATARACT EXTRACTION W/PHACO Right 12/03/2012   Procedure: CATARACT EXTRACTION RIGHT EYE (WITH PHACO) AND INTRAOCULAR LENS PLACEMENT  CDE=2.41;  Surgeon: Elta Guadeloupe [name], MD;  Location: [facility];  Service: Ophthalmology;  Laterality: Right;  . CATARACT EXTRACTION W/PHACO Left 12/17/2012   Procedure: CATARACT EXTRACTION PHACO AND INTRAOCULAR LENS PLACEMENT (IOC);  Surgeon: Elta Guadeloupe [name], MD;  Location: [facility];  Service: Ophthalmology;   Laterality: Left;  CDE:7.82  . COLONOSCOPY    . CORONARY ANGIOPLASTY WITH STENT PLACEMENT  2005  . CORONARY STENT PLACEMENT  03/30/2015   RCA    DES  . HERNIA REPAIR    . KNEE ARTHROSCOPY Left 2012  . [place]   "herniated disc"  . UMBILICAL HERNIA REPAIR  1990s  . YAG LASER APPLICATION Right 43/15/4008   Procedure: YAG LASER APPLICATION;  Surgeon: Elta Guadeloupe [name], MD;  Location: [facility];  Service: Ophthalmology;  Laterality: Right;    Family History  Problem Relation Age of Onset  . Colon cancer Mother        deceased age 59, diagnosed with CRC in 44s.    Social History   Social History  . Marital status: Widowed    Spouse name: N/A  . Number of children: N/A  . Years of education: N/A   Occupational History  . Not on file.   Social History Main Topics  . Smoking status: Former Smoker    Packs/day: 0.10    Years: 10.00    Types: Cigarettes    Quit date: 01/31/2004  . Smokeless tobacco: Never Used  . Alcohol use No  . Drug use: No  . Sexual activity: No   Other Topics Concern  . Not on file   Social History Narrative  . No narrative on file      ROS:  General: Negative for anorexia, weight loss, fever, chills, fatigue, weakness. Eyes: Negative for vision changes.  ENT: Negative for hoarseness, difficulty swallowing , nasal congestion. CV: Negative for chest pain, angina, palpitations, dyspnea on exertion, peripheral edema.  Respiratory: Negative for dyspnea at rest, dyspnea on exertion, cough, sputum, wheezing.  GI: See history of present illness. GU:  Negative for dysuria, hematuria, urinary incontinence, urinary frequency, nocturnal urination.  MS: Negative for joint pain.Chronic low back pain.  Derm: Negative for rash or itching.  Neuro: Negative for weakness, abnormal sensation, seizure, frequent headaches, memory loss, confusion.  Psych: Negative for anxiety, depression, suicidal ideation, hallucinations.  Endo: Negative for unusual  weight change.  Heme: Negative for bruising or bleeding. Allergy: Negative for rash or hives.    Physical Examination:  BP (!) 141/82   Pulse 71   Temp 97.9 F (36.6 C) (Oral)   Ht 5\' 8"  (1.727 m)   Wt (!) 319 lb 3.2 oz (144.8 kg)   BMI 48.53 kg/m    General: Well-nourished, well-developed in no acute distress.  Head: Normocephalic, atraumatic.   Eyes: Conjunctiva pink, no icterus. Mouth: Oropharyngeal mucosa moist and pink , no lesions erythema or exudate. Neck: Supple without thyromegaly, masses, or lymphadenopathy.  Lungs: Clear to auscultation bilaterally.  Heart: Regular rate and rhythm, no murmurs rubs or gallops.  Abdomen: Bowel sounds are normal, nontender, nondistended, no hepatosplenomegaly or masses, no abdominal bruits or    hernia , no rebound or guarding.  Obese, limits exam Rectal: Deferred Extremities: No lower extremity edema. No clubbing or deformities.  Neuro: Alert and oriented x 4 , grossly normal neurologically.  Skin: Warm and dry, no rash or jaundice.   Psych: Alert and cooperative, normal mood and affect.  Labs: Lab Results  Component Value Date   CREATININE 1.02 09/09/2015   BUN 12 09/09/2015   NA 141 09/09/2015   K 4.6 09/09/2015   CL 105 09/09/2015   CO2 28 09/09/2015   Lab Results  Component Value Date   ALT 26 05/21/2008   AST 19 12/26/2012   ALKPHOS 38 (L) 05/21/2008   BILITOT 0.7 05/21/2008   Lab Results  Component Value Date   WBC 8.7 04/15/2015   HGB 10.0 (L) 04/15/2015   HCT 30.5 (L) 04/15/2015   MCV 90.2 04/15/2015   PLT 193 04/15/2015     Imaging Studies: No results found.

## 2016-07-24 NOTE — Transfer of Care (Signed)
Immediate Anesthesia Transfer of Care Note  Patient: Steven Stevens  Procedure(s) Performed: Procedure(s) with comments: COLONOSCOPY WITH PROPOFOL (N/A) - 7:30am POLYPECTOMY - ascending colon polyp, splenic flexure polyps times 2  Patient Location: PACU  Anesthesia Type:MAC  Level of Consciousness: awake, alert , oriented and patient cooperative  Airway & Oxygen Therapy: Patient Spontanous Breathing and Patient connected to nasal cannula oxygen  Post-op Assessment: Report given to RN and Post -op Vital signs reviewed and stable  Post vital signs: Reviewed and stable  Last Vitals:  Vitals:   07/24/16 0651  Pulse: 63  Resp: 14  Temp: 36.9 C    Last Pain:  Vitals:   07/24/16 0651  TempSrc: Oral      Patients Stated Pain Goal: 5 (48/18/56 3149)  Complications: No apparent anesthesia complications

## 2016-07-24 NOTE — Anesthesia Preprocedure Evaluation (Signed)
Anesthesia Evaluation  Patient identified by MRN, date of birth, ID band Patient awake  General Assessment Comment:HR 40's, will resume beta blocker when he returns home today.  Reviewed: Allergy & Precautions, H&P , Patient's Chart, lab work & pertinent test results, reviewed documented beta blocker date and time   Airway Mallampati: IV  TM Distance: >3 FB Neck ROM: Full    Dental  (+) Edentulous Upper, Partial Lower   Pulmonary former smoker,    breath sounds clear to auscultation       Cardiovascular hypertension, Pt. on medications + angina with exertion + CAD, + Past MI and + Cardiac Stents   Rhythm:Regular Rate:Normal     Neuro/Psych    GI/Hepatic neg GERD  ,  Endo/Other  diabetes, Type 2, Oral Hypoglycemic AgentsMorbid obesity  Renal/GU      Musculoskeletal   Abdominal   Peds  Hematology   Anesthesia Other Findings   Reproductive/Obstetrics                             Anesthesia Physical Anesthesia Plan  ASA: III  Anesthesia Plan: MAC   Post-op Pain Management:    Induction: Intravenous  PONV Risk Score and Plan:   Airway Management Planned: Simple Face Mask  Additional Equipment:   Intra-op Plan:   Post-operative Plan:   Informed Consent: I have reviewed the patients History and Physical, chart, labs and discussed the procedure including the risks, benefits and alternatives for the proposed anesthesia with the patient or authorized representative who has indicated his/her understanding and acceptance.     Plan Discussed with:   Anesthesia Plan Comments:         Anesthesia Quick Evaluation

## 2016-07-24 NOTE — Discharge Instructions (Addendum)
Colonoscopy Discharge Instructions  Read the instructions outlined below and refer to this sheet in the next few weeks. These discharge instructions provide you with general information on caring for yourself after you leave the hospital. Your doctor may also give you specific instructions. While your treatment has been planned according to the most current medical practices available, unavoidable complications occasionally occur. If you have any problems or questions after discharge, call Dr. Gala Romney at [phone]. ACTIVITY  You may resume your regular activity, but move at a slower pace for the next 24 hours.   Take frequent rest periods for the next 24 hours.   Walking will help get rid of the air and reduce the bloated feeling in your belly (abdomen).   No driving for 24 hours (because of the medicine (anesthesia) used during the test).    Do not sign any important legal documents or operate any machinery for 24 hours (because of the anesthesia used during the test).  NUTRITION  Drink plenty of fluids.   You may resume your normal diet as instructed by your doctor.   Begin with a light meal and progress to your normal diet. Heavy or fried foods are harder to digest and may make you feel sick to your stomach (nauseated).   Avoid alcoholic beverages for 24 hours or as instructed.  MEDICATIONS  You may resume your normal medications unless your doctor tells you otherwise.  WHAT YOU CAN EXPECT TODAY  Some feelings of bloating in the abdomen.   Passage of more gas than usual.   Spotting of blood in your stool or on the toilet paper.  IF YOU HAD POLYPS REMOVED DURING THE COLONOSCOPY:  No aspirin products for 7 days or as instructed.   No alcohol for 7 days or as instructed.   Eat a soft diet for the next 24 hours.  FINDING OUT THE RESULTS OF YOUR TEST Not all test results are available during your visit. If your test results are not back during the visit, make an appointment  with your caregiver to find out the results. Do not assume everything is normal if you have not heard from your caregiver or the medical facility. It is important for you to follow up on all of your test results.  SEEK IMMEDIATE MEDICAL ATTENTION IF:  You have more than a spotting of blood in your stool.   Your belly is swollen (abdominal distention).   You are nauseated or vomiting.   You have a temperature over 101.   You have abdominal pain or discomfort that is severe or gets worse throughout the day.    Colon polyp and diverticulosis information provided   Further recommendations to follow pending her pathology report  May resume all medications including Plavix    Colon Polyps Polyps are tissue growths inside the body. Polyps can grow in many places, including the large intestine (colon). A polyp may be a round bump or a mushroom-shaped growth. You could have one polyp or several. Most colon polyps are noncancerous (benign). However, some colon polyps can become cancerous over time. What are the causes? The exact cause of colon polyps is not known. What increases the risk? This condition is more likely to develop in people who:  Have a family history of colon cancer or colon polyps.  Are older than 23 or older than 45 if they are African American.  Have inflammatory bowel disease, such as ulcerative colitis or Crohn disease.  Are overweight.  Smoke cigarettes.  Do not get enough exercise.  Drink too much alcohol.  Eat a diet that is: ? High in fat and red meat. ? Low in fiber.  Had childhood cancer that was treated with abdominal radiation.  What are the signs or symptoms? Most polyps do not cause symptoms. If you have symptoms, they may include:  Blood coming from your rectum when having a bowel movement.  Blood in your stool.The stool may look dark red or black.  A change in bowel habits, such as constipation or diarrhea.  How is this  diagnosed? This condition is diagnosed with a colonoscopy. This is a procedure that uses a lighted, flexible scope to look at the inside of your colon. How is this treated? Treatment for this condition involves removing any polyps that are found. Those polyps will then be tested for cancer. If cancer is found, your health care provider will talk to you about options for colon cancer treatment. Follow these instructions at home: Diet  Eat plenty of fiber, such as fruits, vegetables, and whole grains.  Eat foods that are high in calcium and vitamin D, such as milk, cheese, yogurt, eggs, liver, fish, and broccoli.  Limit foods high in fat, red meats, and processed meats, such as hot dogs, sausage, bacon, and lunch meats.  Maintain a healthy weight, or lose weight if recommended by your health care provider. General instructions  Do not smoke cigarettes.  Do not drink alcohol excessively.  Keep all follow-up visits as told by your health care provider. This is important. This includes keeping regularly scheduled colonoscopies. Talk to your health care provider about when you need a colonoscopy.  Exercise every day or as told by your health care provider. Contact a health care provider if:  You have new or worsening bleeding during a bowel movement.  You have new or increased blood in your stool.  You have a change in bowel habits.  You unexpectedly lose weight. This information is not intended to replace advice given to you by your health care provider. Make sure you discuss any questions you have with your health care provider. Document Released: 10/20/2003 Document Revised: 07/01/2015 Document Reviewed: 12/14/2014 Elsevier Interactive Patient Education  [employer].    Diverticulosis Diverticulosis is a condition that develops when small pouches (diverticula) form in the wall of the large intestine (colon). The colon is where water is absorbed and stool is formed. The  pouches form when the inside layer of the colon pushes through weak spots in the outer layers of the colon. You may have a few pouches or many of them. What are the causes? The cause of this condition is not known. What increases the risk? The following factors may make you more likely to develop this condition:  Being older than age 58. Your risk for this condition increases with age. Diverticulosis is rare among people younger than age 69. By age 62, many people have it.  Eating a low-fiber diet.  Having frequent constipation.  Being overweight.  Not getting enough exercise.  Smoking.  Taking over-the-counter pain medicines, like aspirin and ibuprofen.  Having a family history of diverticulosis.  What are the signs or symptoms? In most people, there are no symptoms of this condition. If you do have symptoms, they may include:  Bloating.  Cramps in the abdomen.  Constipation or diarrhea.  Pain in the lower left side of the abdomen.  How is this diagnosed? This condition is most often diagnosed during an exam  for other colon problems. Because diverticulosis usually has no symptoms, it often cannot be diagnosed independently. This condition may be diagnosed by:  Using a flexible scope to examine the colon (colonoscopy).  Taking an X-ray of the colon after dye has been put into the colon (barium enema).  Doing a CT scan.  How is this treated? You may not need treatment for this condition if you have never developed an infection related to diverticulosis. If you have had an infection before, treatment may include:  Eating a high-fiber diet. This may include eating more fruits, vegetables, and grains.  Taking a fiber supplement.  Taking a live bacteria supplement (probiotic).  Taking medicine to relax your colon.  Taking antibiotic medicines.  Follow these instructions at home:  Drink 6-8 glasses of water or more each day to prevent constipation.  Try not to  strain when you have a bowel movement.  If you have had an infection before: ? Eat more fiber as directed by your health care provider or your diet and nutrition specialist (dietitian). ? Take a fiber supplement or probiotic, if your health care provider approves.  Take over-the-counter and prescription medicines only as told by your health care provider.  If you were prescribed an antibiotic, take it as told by your health care provider. Do not stop taking the antibiotic even if you start to feel better.  Keep all follow-up visits as told by your health care provider. This is important. Contact a health care provider if:  You have pain in your abdomen.  You have bloating.  You have cramps.  You have not had a bowel movement in 3 days. Get help right away if:  Your pain gets worse.  Your bloating becomes very bad.  You have a fever or chills, and your symptoms suddenly get worse.  You vomit.  You have bowel movements that are bloody or black.  You have bleeding from your rectum. Summary  Diverticulosis is a condition that develops when small pouches (diverticula) form in the wall of the large intestine (colon).  You may have a few pouches or many of them.  This condition is most often diagnosed during an exam for other colon problems.  If you have had an infection related to diverticulosis, treatment may include increasing the fiber in your diet, taking supplements, or taking medicines. This information is not intended to replace advice given to you by your health care provider. Make sure you discuss any questions you have with your health care provider. Document Released: 10/21/2003 Document Revised: 12/13/2015 Document Reviewed: 12/13/2015 Elsevier Interactive Patient Education  2017 [employer].

## 2016-07-25 ENCOUNTER — Encounter: Payer: Self-pay | Admitting: Internal Medicine

## 2016-07-25 NOTE — Addendum Note (Signed)
Addendum  created 07/25/16 0943 by Vista Deck, CRNA   Charge Capture section accepted

## 2016-07-27 ENCOUNTER — Encounter (HOSPITAL_COMMUNITY): Payer: Self-pay | Admitting: Internal Medicine

## 2016-08-18 NOTE — Progress Notes (Signed)
Hgb improved. Normocytic anemia. Likely anemia of chronic disease.

## 2016-08-25 ENCOUNTER — Other Ambulatory Visit: Payer: Self-pay | Admitting: Adult Health

## 2016-09-28 DIAGNOSIS — E1165 Type 2 diabetes mellitus with hyperglycemia: Secondary | ICD-10-CM | POA: Diagnosis not present

## 2016-09-28 DIAGNOSIS — I10 Essential (primary) hypertension: Secondary | ICD-10-CM | POA: Diagnosis not present

## 2016-10-10 DIAGNOSIS — H40013 Open angle with borderline findings, low risk, bilateral: Secondary | ICD-10-CM | POA: Diagnosis not present

## 2016-10-10 DIAGNOSIS — E119 Type 2 diabetes mellitus without complications: Secondary | ICD-10-CM | POA: Diagnosis not present

## 2016-10-10 DIAGNOSIS — Z961 Presence of intraocular lens: Secondary | ICD-10-CM | POA: Diagnosis not present

## 2016-10-10 DIAGNOSIS — H524 Presbyopia: Secondary | ICD-10-CM | POA: Diagnosis not present

## 2016-10-10 DIAGNOSIS — H5213 Myopia, bilateral: Secondary | ICD-10-CM | POA: Diagnosis not present

## 2016-11-09 DIAGNOSIS — H40013 Open angle with borderline findings, low risk, bilateral: Secondary | ICD-10-CM | POA: Diagnosis not present

## 2016-11-16 ENCOUNTER — Other Ambulatory Visit: Payer: Self-pay | Admitting: Adult Health

## 2017-01-02 DIAGNOSIS — Z0000 Encounter for general adult medical examination without abnormal findings: Secondary | ICD-10-CM | POA: Diagnosis not present

## 2017-01-02 DIAGNOSIS — Z1211 Encounter for screening for malignant neoplasm of colon: Secondary | ICD-10-CM | POA: Diagnosis not present

## 2017-02-08 ENCOUNTER — Other Ambulatory Visit (HOSPITAL_COMMUNITY): Payer: Self-pay | Admitting: Pulmonary Disease

## 2017-02-08 ENCOUNTER — Ambulatory Visit (HOSPITAL_COMMUNITY)
Admission: RE | Admit: 2017-02-08 | Discharge: 2017-02-08 | Disposition: A | Discharge status: Home / Self Care | Payer: Medicare Other | Source: Ambulatory Visit | Attending: Pulmonary Disease | Admitting: Pulmonary Disease

## 2017-02-08 DIAGNOSIS — I25119 Atherosclerotic heart disease of native coronary artery with unspecified angina pectoris: Secondary | ICD-10-CM | POA: Diagnosis not present

## 2017-02-08 DIAGNOSIS — R2242 Localized swelling, mass and lump, left lower limb: Secondary | ICD-10-CM

## 2017-02-08 DIAGNOSIS — M79605 Pain in left leg: Secondary | ICD-10-CM | POA: Insufficient documentation

## 2017-02-08 DIAGNOSIS — I10 Essential (primary) hypertension: Secondary | ICD-10-CM | POA: Diagnosis not present

## 2017-02-08 DIAGNOSIS — M7989 Other specified soft tissue disorders: Secondary | ICD-10-CM | POA: Diagnosis not present

## 2017-02-08 DIAGNOSIS — I8002 Phlebitis and thrombophlebitis of superficial vessels of left lower extremity: Secondary | ICD-10-CM | POA: Insufficient documentation

## 2017-02-08 DIAGNOSIS — E1165 Type 2 diabetes mellitus with hyperglycemia: Secondary | ICD-10-CM | POA: Diagnosis not present

## 2017-02-08 DIAGNOSIS — M25475 Effusion, left foot: Secondary | ICD-10-CM | POA: Diagnosis not present

## 2017-03-02 ENCOUNTER — Ambulatory Visit (HOSPITAL_COMMUNITY)
Admission: RE | Admit: 2017-03-02 | Discharge: 2017-03-02 | Disposition: A | Discharge status: Home / Self Care | Payer: Medicare Other | Source: Ambulatory Visit | Attending: Pulmonary Disease | Admitting: Pulmonary Disease

## 2017-03-02 ENCOUNTER — Other Ambulatory Visit (HOSPITAL_COMMUNITY): Payer: Self-pay | Admitting: Pulmonary Disease

## 2017-03-02 DIAGNOSIS — M79672 Pain in left foot: Secondary | ICD-10-CM | POA: Insufficient documentation

## 2017-03-02 DIAGNOSIS — M7989 Other specified soft tissue disorders: Secondary | ICD-10-CM | POA: Diagnosis not present

## 2017-03-02 DIAGNOSIS — M25572 Pain in left ankle and joints of left foot: Secondary | ICD-10-CM | POA: Diagnosis not present

## 2017-03-14 ENCOUNTER — Ambulatory Visit: Payer: Medicare Other | Admitting: Orthopaedic Surgery

## 2017-03-26 ENCOUNTER — Encounter: Payer: Self-pay | Admitting: Internal Medicine

## 2017-03-26 ENCOUNTER — Ambulatory Visit: Payer: Medicare Other | Admitting: Internal Medicine

## 2017-03-26 VITALS — BP 114/78 | HR 81 | Ht 68.0 in | Wt 313.0 lb

## 2017-03-26 DIAGNOSIS — I10 Essential (primary) hypertension: Secondary | ICD-10-CM

## 2017-03-26 DIAGNOSIS — E782 Mixed hyperlipidemia: Secondary | ICD-10-CM

## 2017-03-26 DIAGNOSIS — I2510 Atherosclerotic heart disease of native coronary artery without angina pectoris: Secondary | ICD-10-CM

## 2017-03-26 NOTE — Patient Instructions (Addendum)
Your physician wants you to follow-up in: 1 year with Dr Theressa Stamps will receive a reminder letter in the mail two months in advance. If you don't receive a letter, please call our office to schedule the follow-up appointment.    Your physician recommends that you continue on your current medications as directed. Please refer to the Current Medication list given to you today. m   If you need a refill on your cardiac medications before your next appointment, please call your pharmacy.     No lab work or tests ordered    Thank you for choosing [facility] !

## 2017-03-26 NOTE — Progress Notes (Signed)
Cardiology Office Note   Date:  03/26/2017   ID:  Steven Stevens, DOB Mar 17, 1945, MRN [id number]  PCP:  Sinda Du, MD  Cardiologist:   [name], MD   F/U of CAD     History of Present Illness: Steven Stevens is a 72 y.o. male with a history of CAD He is s/p PTCA/ DES to RCA in 2005; s/p PTCA/DES to RCA in 2017; s/p PCI with DES to CTO  in March 2017)  Also he has a hstory of COPD, morbid obesity, HL and type II DM   He was seen by Arnold Long in clinic in 2017  He follows with Dr Luan Pulling  Since seen he denies CP  Breathing is OK "unless I push myself too hard"  Notes ot change in his ability to do things   He does not exercise regularly  Wants to get back to gym  He has a membership Admits to eating not always the best         Current Meds  Medication Sig  . acetaminophen (TYLENOL) 325 MG tablet Take 2 tablets (650 mg total) by mouth every 4 (four) hours as needed for headache or mild pain.  Marland Kitchen ALPRAZolam (XANAX) 1 MG tablet Take 1 mg by mouth 4 (four) times daily as needed for anxiety.   Marland Kitchen aspirin EC 81 MG tablet Take 81 mg by mouth daily.  Marland Kitchen atorvastatin (LIPITOR) 40 MG tablet TAKE 1 TABLET BY MOUTH DAILY  . clopidogrel (PLAVIX) 75 MG tablet Take 1 tablet (75 mg total) by mouth daily.  Marland Kitchen glipiZIDE-metformin (METAGLIP) 5-500 MG tablet Take 1 tablet by mouth 2 (two) times daily before a meal.  . HYDROcodone-acetaminophen (LORTAB) 10-500 MG per tablet Take 1 tablet by mouth every 6 (six) hours as needed for pain.   Marland Kitchen losartan (COZAAR) 25 MG tablet TAKE 1 TABLET BY MOUTH EVERY DAY  . losartan (COZAAR) 25 MG tablet TAKE 1 TABLET BY MOUTH EVERY DAY  . metoprolol tartrate (LOPRESSOR) 25 MG tablet Take 12.5 mg by mouth 2 (two) times daily.  . nitroGLYCERIN (NITROSTAT) 0.4 MG SL tablet Place 1 tablet (0.4 mg total) under the tongue every 5 (five) minutes as needed for chest pain.  . ONE TOUCH ULTRA TEST test strip   . sertraline (ZOLOFT) 100 MG tablet Take 1 tablet by mouth  daily.  Marland Kitchen terazosin (HYTRIN) 5 MG capsule Take 5 mg by mouth at bedtime.  . traZODone (DESYREL) 50 MG tablet Take 50 mg by mouth at bedtime as needed.     Allergies:   Patient has no known allergies.   Past Medical History:  Diagnosis Date  . Chronic lower back pain   . Coronary artery disease   . Depression    "lost wife 01/14/2015"  . High cholesterol   . Hypertension   . Myocardial infarct ([place]) 01/27/2004  . Type II diabetes mellitus ([place])     Past Surgical History:  Procedure Laterality Date  . BACK SURGERY    . CARDIAC CATHETERIZATION N/A 03/30/2015   Procedure: Left Heart Cath and Coronary Angiography;  Surgeon: Jettie Booze, MD;  Location: [facility];  Service: Cardiovascular;  Laterality: N/A;  . CARDIAC CATHETERIZATION  03/30/2015   Procedure: Coronary Stent Intervention;  Surgeon: Jettie Booze, MD;  Location: [facility];  Service: Cardiovascular;;  . CARDIAC CATHETERIZATION N/A 04/14/2015   Procedure: Coronary/Bypass Graft CTO Intervention;  Surgeon: Jettie Booze, MD;  Location: [facility];  Service: Cardiovascular;  Laterality: N/A;  . CARDIAC CATHETERIZATION  04/14/2015   Procedure: Left Heart Cath and Coronary Angiography;  Surgeon: Jettie Booze, MD;  Location: [facility];  Service: Cardiovascular;;  . CATARACT EXTRACTION W/PHACO Right 12/03/2012   Procedure: CATARACT EXTRACTION RIGHT EYE (WITH PHACO) AND INTRAOCULAR LENS PLACEMENT  CDE=2.41;  Surgeon: Elta Guadeloupe [name], MD;  Location: [facility];  Service: Ophthalmology;  Laterality: Right;  . CATARACT EXTRACTION W/PHACO Left 12/17/2012   Procedure: CATARACT EXTRACTION PHACO AND INTRAOCULAR LENS PLACEMENT (IOC);  Surgeon: Elta Guadeloupe [name], MD;  Location: [facility];  Service: Ophthalmology;  Laterality: Left;  CDE:7.82  . COLONOSCOPY  2011   Dr. Gala Romney: diverticulosis, tubular adenomas  . COLONOSCOPY WITH PROPOFOL N/A 07/24/2016   Procedure: COLONOSCOPY WITH PROPOFOL;   Surgeon: Daneil Dolin, MD;  Location: [facility];  Service: Endoscopy;  Laterality: N/A;  7:30am  . CORONARY ANGIOPLASTY WITH STENT PLACEMENT  2005  . CORONARY STENT PLACEMENT  03/30/2015   RCA    DES  . HERNIA REPAIR    . KNEE ARTHROSCOPY Left 2012  . [place]   "herniated disc"  . POLYPECTOMY  07/24/2016   Procedure: POLYPECTOMY;  Surgeon: Daneil Dolin, MD;  [facility];  Service: Endoscopy;;  ascending colon polyp, splenic flexure polyps times 2  . UMBILICAL HERNIA REPAIR  1990s  . YAG LASER APPLICATION Right 10/62/6948   Procedure: YAG LASER APPLICATION;  Surgeon: Elta Guadeloupe [name], MD;  Location: [facility];  Service: Ophthalmology;  Laterality: Right;     Social History:  The patient  reports that he quit smoking about 13 years ago. His smoking use included cigarettes. He has a 1.00 pack-year smoking history. he has never used smokeless tobacco. He reports that he does not drink alcohol or use drugs.   Family History:  The patient's family history includes Colon cancer in his mother.    ROS:  Please see the history of present illness. All other systems are reviewed and  Negative to the above problem except as noted.    PHYSICAL EXAM: VS:  BP 114/78   Pulse 81   Ht 5\' 8"  (1.727 m)   Wt (!) 313 lb (142 kg)   SpO2 94%   BMI 47.59 kg/m   GEN:  Mobridly obese 72 yo in no acute distress  HEENT: normal  Neck: no JVD, carotid bruits, or masses Cardiac: RRR; no murmurs, rubs, or gallops,no edema  Respiratory:  clear to auscultation bilaterally, normal work of breathing GI: Obese  nontender  No definite masses MS: no deformity Moving all extremities   Skin: warm and dry, no rash Neuro:  Strength and sensation are intact Psych: euthymic mood, full affect   EKG:  EKG is not ordered today.  In June 2018  SR  61  Poss IWMI   Lipid Panel    Component Value Date/Time   CHOL 162 12/26/2012 0826   TRIG 199 (H) 12/26/2012 0826   HDL 35 (L)  12/26/2012 0826   CHOLHDL 4.6 12/26/2012 0826   VLDL 40 12/26/2012 0826   LDLCALC 87 12/26/2012 0826      Wt Readings from Last 3 Encounters:  03/26/17 (!) 313 lb (142 kg)  07/24/16 (!) 319 lb (144.7 kg)  07/21/16 (!) 319 lb (144.7 kg)      ASSESSMENT AND PLAN:  1  CAD  No symptoms to sugg angina  I encouraged him to increase his  activity  2  HL   Will get lipids fro mDr [facility] office  3  HTN  Good control  Continue meds  Get labs  4  DM  Pt says glu control has been good on current regimen   5  Morbid obesity  Encouraged him to increase activity  Go to gym   Work on diet    Will f/u with pt in 1 year      Current medicines are reviewed at length with the patient today.  The patient does not have concerns regarding medicines.  Signed, [name], MD  03/26/2017 2:26 PM    [address], [place], [place]  [address] Phone: [phone]; Fax: [phone]

## 2017-05-09 DIAGNOSIS — E119 Type 2 diabetes mellitus without complications: Secondary | ICD-10-CM | POA: Diagnosis not present

## 2017-05-09 DIAGNOSIS — M545 Low back pain: Secondary | ICD-10-CM | POA: Diagnosis not present

## 2017-05-09 DIAGNOSIS — I25119 Atherosclerotic heart disease of native coronary artery with unspecified angina pectoris: Secondary | ICD-10-CM | POA: Diagnosis not present

## 2017-06-05 DIAGNOSIS — I25119 Atherosclerotic heart disease of native coronary artery with unspecified angina pectoris: Secondary | ICD-10-CM | POA: Diagnosis not present

## 2017-06-05 DIAGNOSIS — M545 Low back pain: Secondary | ICD-10-CM | POA: Diagnosis not present

## 2017-06-05 DIAGNOSIS — E119 Type 2 diabetes mellitus without complications: Secondary | ICD-10-CM | POA: Diagnosis not present

## 2017-08-08 DIAGNOSIS — I25119 Atherosclerotic heart disease of native coronary artery with unspecified angina pectoris: Secondary | ICD-10-CM | POA: Diagnosis not present

## 2017-08-08 DIAGNOSIS — E1169 Type 2 diabetes mellitus with other specified complication: Secondary | ICD-10-CM | POA: Diagnosis not present

## 2017-08-08 DIAGNOSIS — I10 Essential (primary) hypertension: Secondary | ICD-10-CM | POA: Diagnosis not present

## 2017-11-13 DIAGNOSIS — I25119 Atherosclerotic heart disease of native coronary artery with unspecified angina pectoris: Secondary | ICD-10-CM | POA: Diagnosis not present

## 2017-11-13 DIAGNOSIS — Z23 Encounter for immunization: Secondary | ICD-10-CM | POA: Diagnosis not present

## 2017-11-13 DIAGNOSIS — E785 Hyperlipidemia, unspecified: Secondary | ICD-10-CM | POA: Diagnosis not present

## 2017-11-13 DIAGNOSIS — I10 Essential (primary) hypertension: Secondary | ICD-10-CM | POA: Diagnosis not present

## 2017-12-18 DIAGNOSIS — I10 Essential (primary) hypertension: Secondary | ICD-10-CM | POA: Diagnosis not present

## 2017-12-18 DIAGNOSIS — K648 Other hemorrhoids: Secondary | ICD-10-CM | POA: Diagnosis not present

## 2017-12-18 DIAGNOSIS — M545 Low back pain: Secondary | ICD-10-CM | POA: Diagnosis not present

## 2018-02-12 DIAGNOSIS — E1165 Type 2 diabetes mellitus with hyperglycemia: Secondary | ICD-10-CM | POA: Diagnosis not present

## 2018-02-12 DIAGNOSIS — Z79891 Long term (current) use of opiate analgesic: Secondary | ICD-10-CM | POA: Diagnosis not present

## 2018-02-12 DIAGNOSIS — I10 Essential (primary) hypertension: Secondary | ICD-10-CM | POA: Diagnosis not present

## 2018-02-12 DIAGNOSIS — M545 Low back pain: Secondary | ICD-10-CM | POA: Diagnosis not present

## 2018-02-12 DIAGNOSIS — I25119 Atherosclerotic heart disease of native coronary artery with unspecified angina pectoris: Secondary | ICD-10-CM | POA: Diagnosis not present

## 2018-04-27 IMAGING — DX DG ANKLE COMPLETE 3+V*L*
3 series · 3 of 3 positions shown · non-contrast
Comparison: None.

CLINICAL DATA: Ankle pain for 2 months, no known injury, initial
encounter

EXAM:
LEFT ANKLE COMPLETE - 3+ VIEW

[ankle ap]
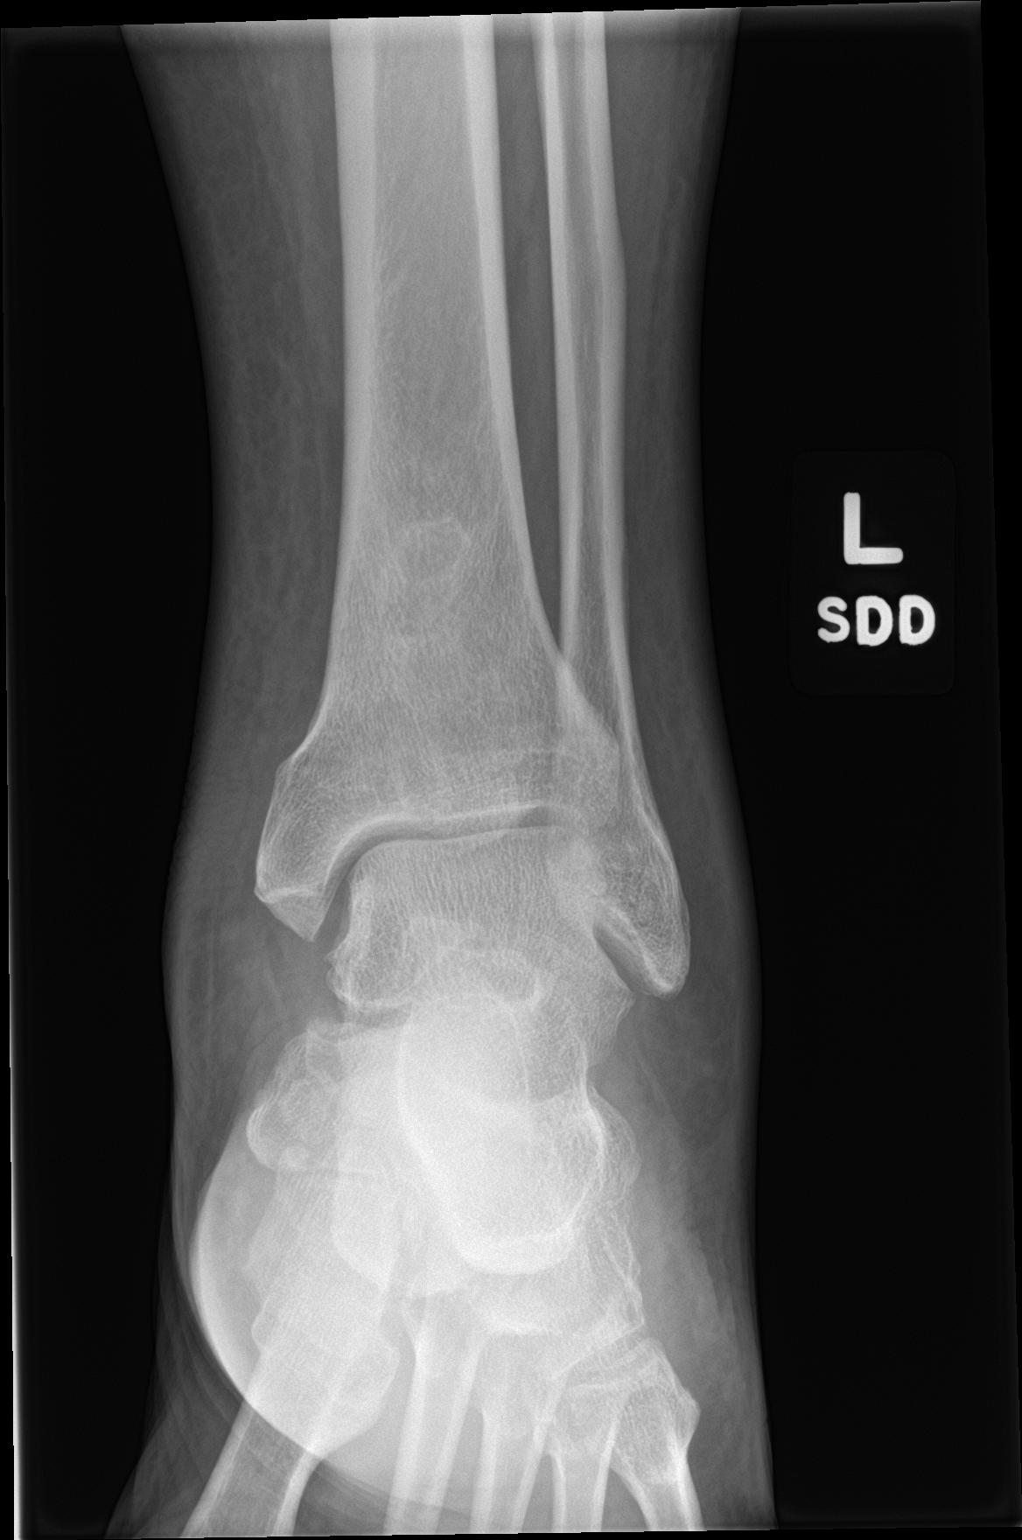

[ankle obl]
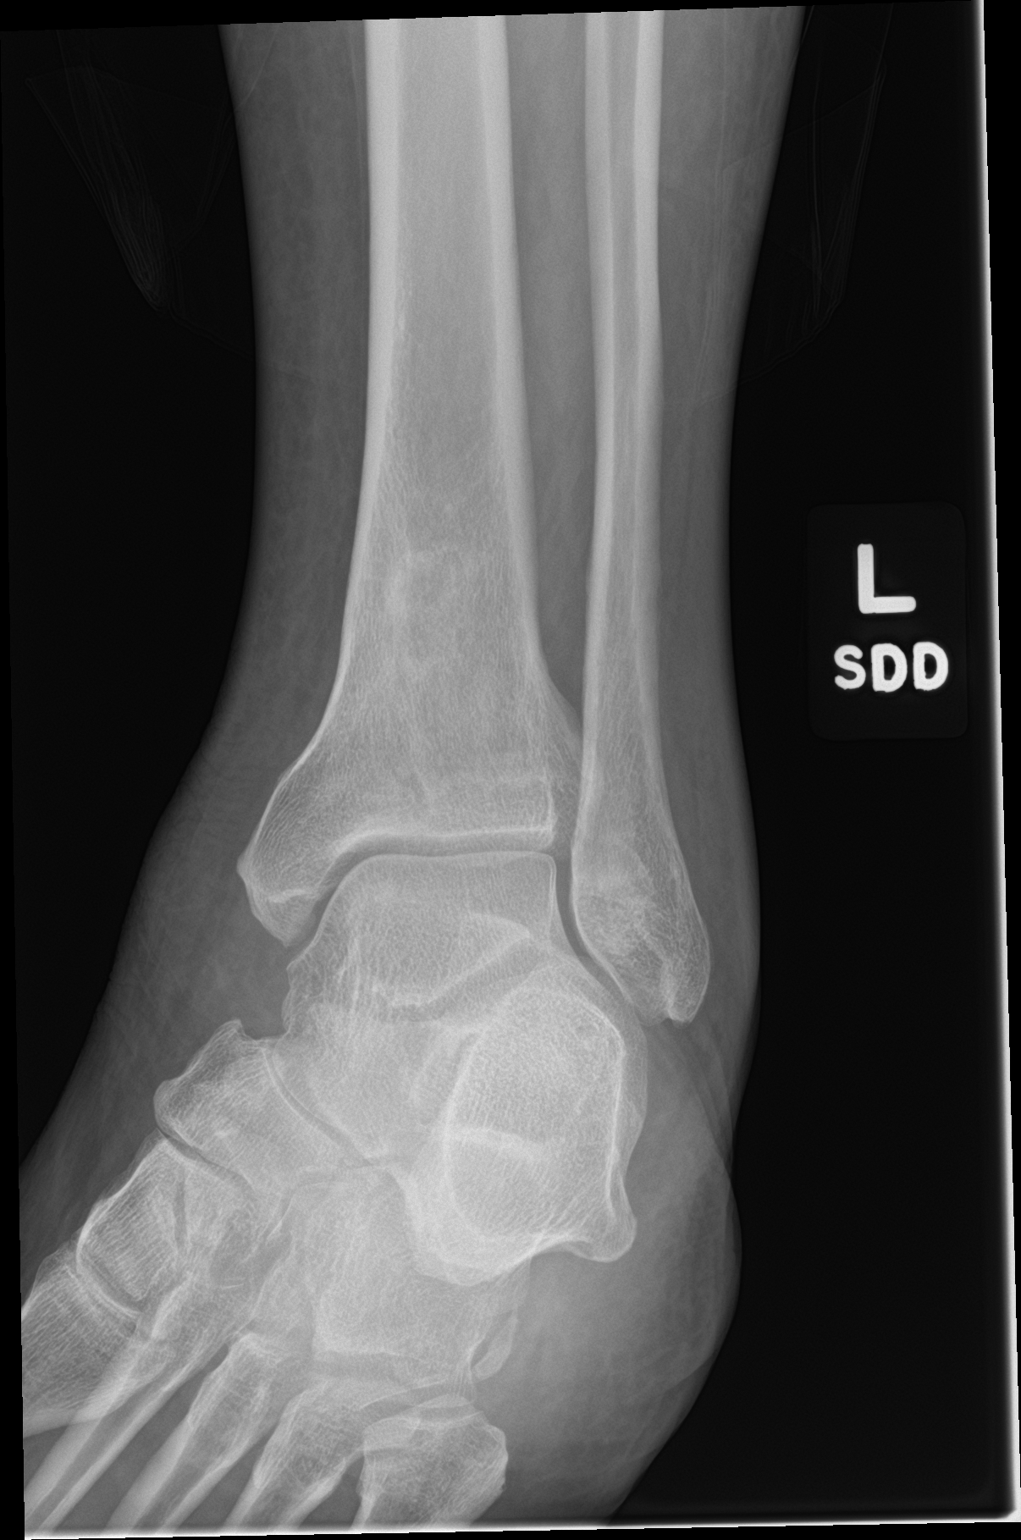

[ankle lat]
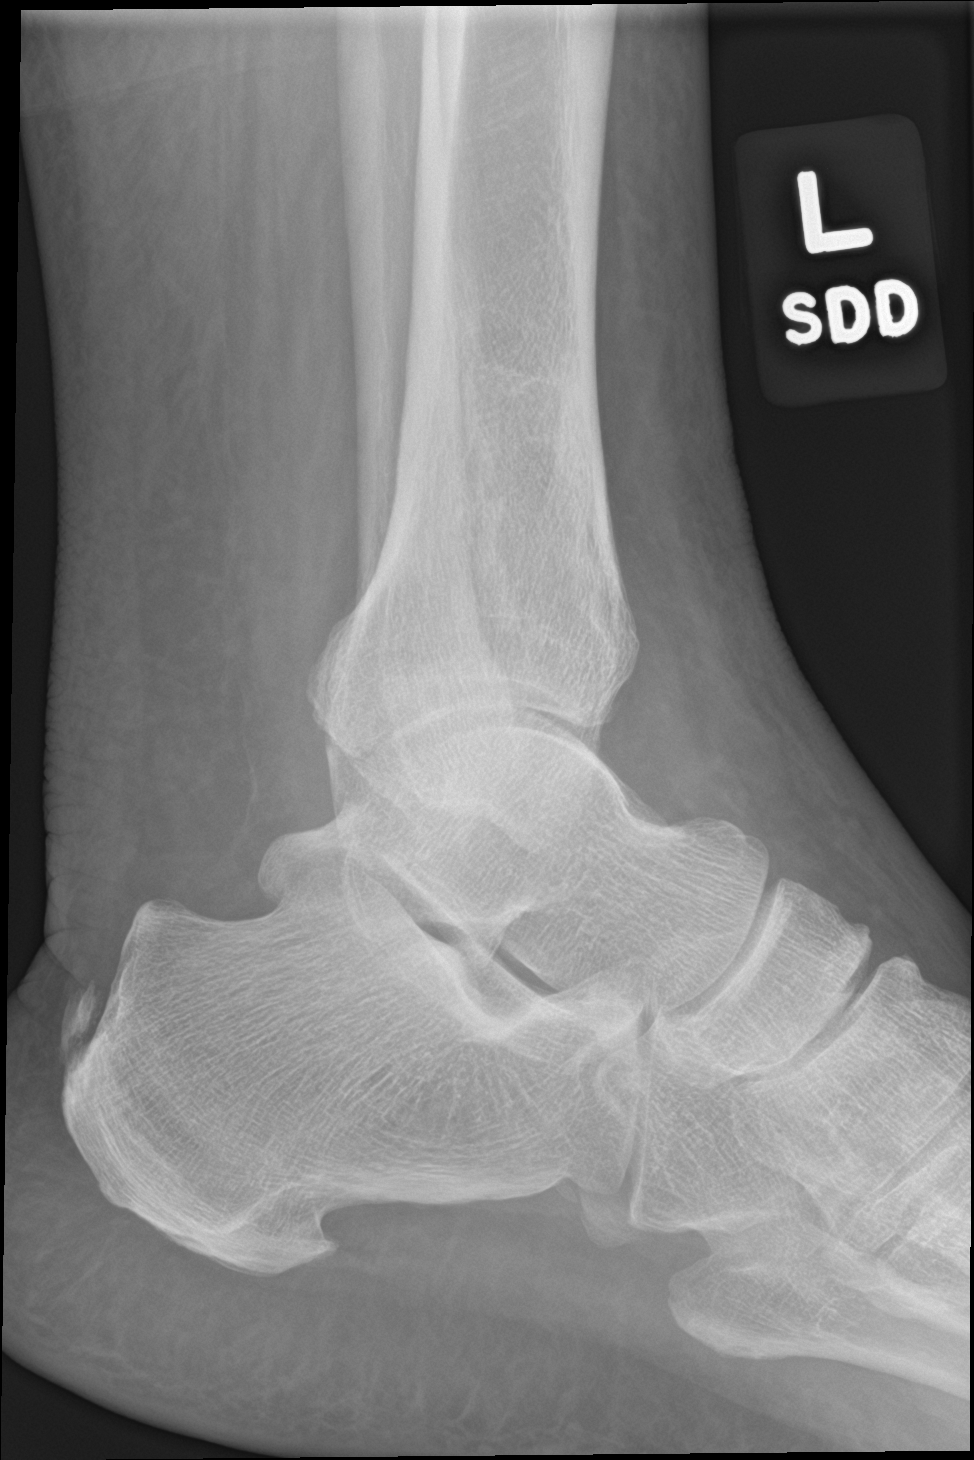

[3 of 3 positions shown; findings below may reference images not displayed]

FINDINGS: Generalized soft tissue swelling about the ankle is noted. No acute
fracture or dislocation is seen. Changes consistent with benign
fibrous cortical defect in the distal tibia are seen. Calcaneal
spurring is noted.
IMPRESSION: Generalized soft tissue swelling without acute bony abnormality.

## 2018-05-08 DIAGNOSIS — E119 Type 2 diabetes mellitus without complications: Secondary | ICD-10-CM | POA: Diagnosis not present

## 2018-05-08 DIAGNOSIS — I2510 Atherosclerotic heart disease of native coronary artery without angina pectoris: Secondary | ICD-10-CM | POA: Diagnosis not present

## 2018-05-08 DIAGNOSIS — M545 Low back pain: Secondary | ICD-10-CM | POA: Diagnosis not present

## 2018-08-07 DIAGNOSIS — M545 Low back pain: Secondary | ICD-10-CM | POA: Diagnosis not present

## 2018-08-07 DIAGNOSIS — I10 Essential (primary) hypertension: Secondary | ICD-10-CM | POA: Diagnosis not present

## 2018-08-07 DIAGNOSIS — I25119 Atherosclerotic heart disease of native coronary artery with unspecified angina pectoris: Secondary | ICD-10-CM | POA: Diagnosis not present

## 2018-09-19 DIAGNOSIS — Z79891 Long term (current) use of opiate analgesic: Secondary | ICD-10-CM | POA: Diagnosis not present

## 2018-09-19 DIAGNOSIS — M545 Low back pain: Secondary | ICD-10-CM | POA: Diagnosis not present

## 2018-09-19 DIAGNOSIS — E1165 Type 2 diabetes mellitus with hyperglycemia: Secondary | ICD-10-CM | POA: Diagnosis not present

## 2018-09-19 DIAGNOSIS — I25119 Atherosclerotic heart disease of native coronary artery with unspecified angina pectoris: Secondary | ICD-10-CM | POA: Diagnosis not present

## 2018-09-19 DIAGNOSIS — E1169 Type 2 diabetes mellitus with other specified complication: Secondary | ICD-10-CM | POA: Diagnosis not present

## 2018-12-18 DIAGNOSIS — E785 Hyperlipidemia, unspecified: Secondary | ICD-10-CM | POA: Diagnosis not present

## 2018-12-18 DIAGNOSIS — Z23 Encounter for immunization: Secondary | ICD-10-CM | POA: Diagnosis not present

## 2018-12-18 DIAGNOSIS — E119 Type 2 diabetes mellitus without complications: Secondary | ICD-10-CM | POA: Diagnosis not present

## 2018-12-18 DIAGNOSIS — I25119 Atherosclerotic heart disease of native coronary artery with unspecified angina pectoris: Secondary | ICD-10-CM | POA: Diagnosis not present

## 2018-12-18 DIAGNOSIS — I10 Essential (primary) hypertension: Secondary | ICD-10-CM | POA: Diagnosis not present

## 2019-01-15 DIAGNOSIS — E785 Hyperlipidemia, unspecified: Secondary | ICD-10-CM | POA: Diagnosis not present

## 2019-01-15 DIAGNOSIS — E1165 Type 2 diabetes mellitus with hyperglycemia: Secondary | ICD-10-CM | POA: Diagnosis not present

## 2019-01-15 DIAGNOSIS — I10 Essential (primary) hypertension: Secondary | ICD-10-CM | POA: Diagnosis not present

## 2019-01-15 DIAGNOSIS — Z23 Encounter for immunization: Secondary | ICD-10-CM | POA: Diagnosis not present

## 2019-01-15 DIAGNOSIS — Z0189 Encounter for other specified special examinations: Secondary | ICD-10-CM | POA: Diagnosis not present

## 2019-01-24 DIAGNOSIS — Z1329 Encounter for screening for other suspected endocrine disorder: Secondary | ICD-10-CM | POA: Diagnosis not present

## 2019-01-24 DIAGNOSIS — E1165 Type 2 diabetes mellitus with hyperglycemia: Secondary | ICD-10-CM | POA: Diagnosis not present

## 2019-01-24 DIAGNOSIS — E785 Hyperlipidemia, unspecified: Secondary | ICD-10-CM | POA: Diagnosis not present

## 2019-01-24 DIAGNOSIS — I10 Essential (primary) hypertension: Secondary | ICD-10-CM | POA: Diagnosis not present

## 2019-06-30 ENCOUNTER — Encounter: Payer: Self-pay | Admitting: Internal Medicine

## 2019-08-14 LAB — HM DIABETES EYE EXAM

## 2019-10-01 ENCOUNTER — Encounter: Payer: Self-pay | Admitting: Gastroenterology

## 2019-10-01 ENCOUNTER — Ambulatory Visit: Payer: Medicare Other | Admitting: Gastroenterology

## 2019-10-01 ENCOUNTER — Other Ambulatory Visit: Payer: Self-pay

## 2019-10-01 VITALS — BP 188/85 | HR 72 | Temp 96.8°F | Ht 68.0 in | Wt 306.8 lb

## 2019-10-01 DIAGNOSIS — Z800 Family history of malignant neoplasm of digestive organs: Secondary | ICD-10-CM | POA: Diagnosis not present

## 2019-10-01 DIAGNOSIS — K5903 Drug induced constipation: Secondary | ICD-10-CM

## 2019-10-01 DIAGNOSIS — Z86010 Personal history of colonic polyps: Secondary | ICD-10-CM | POA: Diagnosis not present

## 2019-10-01 DIAGNOSIS — K5900 Constipation, unspecified: Secondary | ICD-10-CM | POA: Insufficient documentation

## 2019-10-01 NOTE — Patient Instructions (Signed)
1. Continue Linzess daily as needed for management of constipation. 2. Colonoscopy to be scheduled.  Please see separate instructions.

## 2019-10-01 NOTE — Progress Notes (Signed)
Primary Care Physician:  Celene Squibb, MD  Primary Gastroenterologist:  Garfield Cornea, MD   Chief Complaint  Patient presents with  . Constipation    HPI:  Steven Stevens is a 74 y.o. male here to schedule 3 year surveillance colonoscopy.  Last colonoscopy in 2018, 3 tubular adenomas removed.   He has been having some issues with constipation.  Continues to have daily bowel movements but still not as full or complete as before.  Still [place] 4.  He takes Linzess 1-2 times per week if he feels tight or constipated.No melena, brbpr. No abdominal pain. No dysphagia. Rare heartburn.  Uses hydrocodone 4 times daily for back pain.  Notes his anxiety is not as well controlled since his Xanax was reduced from 1 mg 4 times daily to 0.5 mg twice daily.    Current Outpatient Medications  Medication Sig Dispense Refill  . acetaminophen (TYLENOL) 325 MG tablet Take 2 tablets (650 mg total) by mouth every 4 (four) hours as needed for headache or mild pain.    Marland Kitchen ALPRAZolam (XANAX) 0.5 MG tablet Take 0.5 mg by mouth in the morning and at bedtime.    Marland Kitchen aspirin EC 81 MG tablet Take 81 mg by mouth daily.    Marland Kitchen atorvastatin (LIPITOR) 40 MG tablet TAKE 1 TABLET BY MOUTH DAILY 30 tablet 0  . clopidogrel (PLAVIX) 75 MG tablet Take 1 tablet (75 mg total) by mouth daily. 30 tablet 11  . glipiZIDE-metformin (METAGLIP) 5-500 MG tablet Take 1 tablet by mouth 2 (two) times daily before a meal.    . HYDROcodone-acetaminophen (LORTAB) 10-500 MG per tablet Take 1 tablet by mouth every 6 (six) hours.     Marland Kitchen LINZESS 145 MCG CAPS capsule Take 145 mcg by mouth daily as needed.    Marland Kitchen losartan (COZAAR) 25 MG tablet TAKE 1 TABLET BY MOUTH EVERY DAY 15 tablet 0  . losartan (COZAAR) 25 MG tablet TAKE 1 TABLET BY MOUTH EVERY DAY 15 tablet 0  . metoprolol tartrate (LOPRESSOR) 25 MG tablet Take 12.5 mg by mouth 2 (two) times daily.    . nitroGLYCERIN (NITROSTAT) 0.4 MG SL tablet Place 1 tablet (0.4 mg total) under the tongue every  5 (five) minutes as needed for chest pain. 25 tablet 2  . ONE TOUCH ULTRA TEST test strip     . sertraline (ZOLOFT) 100 MG tablet Take 1 tablet by mouth daily.  10  . terazosin (HYTRIN) 5 MG capsule Take 5 mg by mouth at bedtime.    . traZODone (DESYREL) 50 MG tablet Take 50 mg by mouth at bedtime as needed.  11   No current facility-administered medications for this visit.    Allergies as of 10/01/2019  . (No Known Allergies)    Past Medical History:  Diagnosis Date  . Chronic lower back pain   . Coronary artery disease   . Depression    "lost wife 01/14/2015"  . High cholesterol   . Hypertension   . Myocardial infarct ([place]) 01/27/2004  . Type II diabetes mellitus ([place])     Past Surgical History:  Procedure Laterality Date  . BACK SURGERY    . CARDIAC CATHETERIZATION N/A 03/30/2015   Procedure: Left Heart Cath and Coronary Angiography;  Surgeon: Jettie Booze, MD;  Location: [place] [facility];  Service: Cardiovascular;  Laterality: N/A;  . CARDIAC CATHETERIZATION  03/30/2015   Procedure: Coronary Stent Intervention;  Surgeon: Jettie Booze, MD;  Location: [place] [facility];  Service: Cardiovascular;;  . CARDIAC CATHETERIZATION N/A 04/14/2015   Procedure: Coronary/Bypass Graft CTO Intervention;  Surgeon: Jettie Booze, MD;  Location: [place] [facility];  Service: Cardiovascular;  Laterality: N/A;  . CARDIAC CATHETERIZATION  04/14/2015   Procedure: Left Heart Cath and Coronary Angiography;  Surgeon: Jettie Booze, MD;  Location: [place] [facility];  Service: Cardiovascular;;  . CATARACT EXTRACTION W/PHACO Right 12/03/2012   Procedure: CATARACT EXTRACTION RIGHT EYE (WITH PHACO) AND INTRAOCULAR LENS PLACEMENT  CDE=2.41;  Surgeon: Elta Guadeloupe [name], MD;  Location: [facility];  Service: Ophthalmology;  Laterality: Right;  . CATARACT EXTRACTION W/PHACO Left 12/17/2012   Procedure: CATARACT EXTRACTION PHACO AND INTRAOCULAR LENS PLACEMENT (IOC);  Surgeon: Elta Guadeloupe [name],  MD;  Location: [facility];  Service: Ophthalmology;  Laterality: Left;  CDE:7.82  . COLONOSCOPY  2011   Dr. Gala Romney: diverticulosis, tubular adenomas  . COLONOSCOPY WITH PROPOFOL N/A 07/24/2016   [place]: 3 tubular adenomas removed.  Diverticulosis.  3-year surveillance colonoscopy recommended.  . CORONARY ANGIOPLASTY WITH STENT PLACEMENT  2005  . CORONARY STENT PLACEMENT  03/30/2015   RCA    DES  . HERNIA REPAIR    . KNEE ARTHROSCOPY Left 2012  . [place]   "herniated disc"  . POLYPECTOMY  07/24/2016   Procedure: POLYPECTOMY;  Surgeon: Daneil Dolin, MD;  [facility]: [facility];  Service: Endoscopy;;  ascending colon polyp, splenic flexure polyps times 2  . UMBILICAL HERNIA REPAIR  1990s  . YAG LASER APPLICATION Right 31/49/7026   Procedure: YAG LASER APPLICATION;  Surgeon: Elta Guadeloupe [name], MD;  Location: [facility];  Service: Ophthalmology;  Laterality: Right;    Family History  Problem Relation Age of Onset  . Colon cancer Mother        deceased age 60, diagnosed with CRC in 107s.    Social History   Socioeconomic History  . Marital status: Widowed    Spouse name: Not on file  . Number of children: Not on file  . Years of education: Not on file  . Highest education level: Not on file  Occupational History  . Not on file  Tobacco Use  . Smoking status: Former Smoker    Packs/day: 0.10    Years: 10.00    Pack years: 1.00    Types: Cigarettes    Quit date: 01/31/2004    Years since quitting: 15.6  . Smokeless tobacco: Never Used  Vaping Use  . Vaping Use: Never used  Substance and Sexual Activity  . Alcohol use: No    Alcohol/week: 0.0 standard drinks  . Drug use: No  . Sexual activity: Never  Other Topics Concern  . Not on file  Social History Narrative  . Not on file   Social Determinants of Health   Financial Resource Strain:   . Difficulty of Paying Living Expenses: Not on file  Food Insecurity:   . Worried About Charity fundraiser in the Last  Year: Not on file  . Ran Out of Food in the Last Year: Not on file  Transportation Needs:   . Lack of Transportation (Medical): Not on file  . Lack of Transportation (Non-Medical): Not on file  Physical Activity:   . Days of Exercise per Week: Not on file  . Minutes of Exercise per Session: Not on file  Stress:   . Feeling of Stress : Not on file  Social Connections:   . Frequency of Communication with Friends and Family: Not  on file  . Frequency of Social Gatherings with Friends and Family: Not on file  . Attends Religious Services: Not on file  . Active Member of Clubs or Organizations: Not on file  . Attends Archivist Meetings: Not on file  . Marital Status: Not on file  Intimate Partner Violence:   . Fear of Current or Ex-Partner: Not on file  . Emotionally Abused: Not on file  . Physically Abused: Not on file  . Sexually Abused: Not on file      ROS:  General: Negative for anorexia, weight loss, fever, chills, fatigue, weakness. Eyes: Negative for vision changes.  ENT: Negative for hoarseness, difficulty swallowing , nasal congestion. CV: Negative for chest pain, angina, palpitations, dyspnea on exertion, positive peripheral edema.  Respiratory: Negative for dyspnea at rest, dyspnea on exertion, cough, sputum, wheezing.  GI: See history of present illness. GU:  Negative for dysuria, hematuria, urinary incontinence, urinary frequency, nocturnal urination.  MS: Negative for joint pain, positive low back pain.  Derm: Negative for rash or itching.  Neuro: Negative for weakness, abnormal sensation, seizure, frequent headaches, memory loss, confusion.  Psych: Negative for  depression, suicidal ideation, hallucinations.  Positive anxiety Endo: Negative for unusual weight change.  Heme: Negative for bruising or bleeding. Allergy: Negative for rash or hives.    Physical Examination:  BP (!) 188/85   Pulse 72   Temp (!) 96.8 F (36 C) (Temporal)   Ht 5\' 8"   (1.727 m)   Wt (!) 306 lb 12.8 oz (139.2 kg)   BMI 46.65 kg/m    General: Well-nourished, well-developed in no acute distress.  Head: Normocephalic, atraumatic.   Eyes: Conjunctiva pink, no icterus. Mouth: masked Neck: Supple without thyromegaly, masses, or lymphadenopathy.  Lungs: Clear to auscultation bilaterally.  Heart: Regular rate and rhythm, no murmurs rubs or gallops.  Abdomen: Bowel sounds are normal, nontender, nondistended, no hepatosplenomegaly or masses, no abdominal bruits or    hernia , no rebound or guarding.   Rectal: not performed Extremities: trace bilateral lower extremity edema. No clubbing or deformities.  Neuro: Alert and oriented x 4 , grossly normal neurologically.  Skin: Warm and dry, no rash or jaundice.   Psych: Alert and cooperative, normal mood and affect.  Impression/plan:  Pleasant 74 year old gentleman presenting to schedule 3 year surveillance colonoscopy for history of numerous tubular adenomas at time of last colonoscopy.  Family history of colon cancer, mother diagnosed in her 43s.  Patient is interested in pursuing colonoscopy at this time.  He has some mild constipation likely related to his opioid use.  Does well with Linzess as needed.  Encouraged him to take Linzess daily for several days prior to colonoscopy to ensure adequate prep.  Schedule colonoscopy with Dr. Gala Romney with propofol. ASA III.  I have discussed the risks, alternatives, benefits with regards to but not limited to the risk of reaction to medication, bleeding, infection, perforation and the patient is agreeable to proceed. Written consent to be obtained.

## 2019-10-02 NOTE — Progress Notes (Signed)
Cc'ed to pcp °

## 2019-10-10 ENCOUNTER — Ambulatory Visit: Payer: Medicare Other | Admitting: Family Medicine

## 2019-10-14 DIAGNOSIS — Z0001 Encounter for general adult medical examination with abnormal findings: Secondary | ICD-10-CM | POA: Diagnosis not present

## 2019-10-14 DIAGNOSIS — E782 Mixed hyperlipidemia: Secondary | ICD-10-CM | POA: Diagnosis not present

## 2019-10-14 DIAGNOSIS — R7301 Impaired fasting glucose: Secondary | ICD-10-CM | POA: Diagnosis not present

## 2019-10-14 DIAGNOSIS — Z1329 Encounter for screening for other suspected endocrine disorder: Secondary | ICD-10-CM | POA: Diagnosis not present

## 2019-10-15 ENCOUNTER — Encounter: Payer: Self-pay | Admitting: *Deleted

## 2019-10-15 DIAGNOSIS — I10 Essential (primary) hypertension: Secondary | ICD-10-CM | POA: Diagnosis not present

## 2019-10-15 DIAGNOSIS — E785 Hyperlipidemia, unspecified: Secondary | ICD-10-CM | POA: Diagnosis not present

## 2019-10-15 DIAGNOSIS — G8929 Other chronic pain: Secondary | ICD-10-CM | POA: Diagnosis not present

## 2019-10-15 DIAGNOSIS — E1165 Type 2 diabetes mellitus with hyperglycemia: Secondary | ICD-10-CM | POA: Diagnosis not present

## 2019-12-10 NOTE — Patient Instructions (Signed)
Steven Stevens  12/10/2019     @PREFPERIOPPHARMACY @   Your procedure is scheduled on  12/15/2019.  Report to [facility] at  0700  A.M.  Call this number if you have problems the morning of surgery:  [phone]   Remember:  Follow the diet and prep instructions given to you by the office.                       Take these medicines the morning of surgery with A SIP OF WATER  Xanax (if needed), lortab(if needed), metoprolol, zoloft.    Do not wear jewelry, make-up or nail polish.  Do not wear lotions, powders, or perfumes. Please wear deodorant and brush your teeth.  Do not shave 48 hours prior to surgery.  Men may shave face and neck.  Do not bring valuables to the hospital.  [facility] is not responsible for any belongings or valuables.  Contacts, dentures or bridgework may not be worn into surgery.  Leave your suitcase in the car.  After surgery it may be brought to your room.  For patients admitted to the hospital, discharge time will be determined by your treatment team.  Patients discharged the day of surgery will not be allowed to drive home.   Name and phone number of your driver:   family Special instructions:   DO NOT smoke the morning of your procedure.  Please read over the following fact sheets that you were given. Anesthesia Post-op Instructions and Care and Recovery After Surgery       Colonoscopy, Adult, Care After This sheet gives you information about how to care for yourself after your procedure. Your health care provider may also give you more specific instructions. If you have problems or questions, contact your health care provider. What can I expect after the procedure? After the procedure, it is common to have:  A small amount of blood in your stool for 24 hours after the procedure.  Some gas.  Mild cramping or bloating of your abdomen. Follow these instructions at home: Eating and drinking   Drink enough fluid to keep your urine  pale yellow.  Follow instructions from your health care provider about eating or drinking restrictions.  Resume your normal diet as instructed by your health care provider. Avoid heavy or fried foods that are hard to digest. Activity  Rest as told by your health care provider.  Avoid sitting for a long time without moving. Get up to take short walks every 1-2 hours. This is important to improve blood flow and breathing. Ask for help if you feel weak or unsteady.  Return to your normal activities as told by your health care provider. Ask your health care provider what activities are safe for you. Managing cramping and bloating   Try walking around when you have cramps or feel bloated.  Apply heat to your abdomen as told by your health care provider. Use the heat source that your health care provider recommends, such as a moist heat pack or a heating pad. ? Place a towel between your skin and the heat source. ? Leave the heat on for 20-30 minutes. ? Remove the heat if your skin turns bright red. This is especially important if you are unable to feel pain, heat, or cold. You may have a greater risk of getting burned. General instructions  For the first 24 hours after the procedure: ? Do not drive or  use machinery. ? Do not sign important documents. ? Do not drink alcohol. ? Do your regular daily activities at a slower pace than normal. ? Eat soft foods that are easy to digest.  Take over-the-counter and prescription medicines only as told by your health care provider.  Keep all follow-up visits as told by your health care provider. This is important. Contact a health care provider if:  You have blood in your stool 2-3 days after the procedure. Get help right away if you have:  More than a small spotting of blood in your stool.  Large blood clots in your stool.  Swelling of your abdomen.  Nausea or vomiting.  A fever.  Increasing pain in your abdomen that is not relieved  with medicine. Summary  After the procedure, it is common to have a small amount of blood in your stool. You may also have mild cramping and bloating of your abdomen.  For the first 24 hours after the procedure, do not drive or use machinery, sign important documents, or drink alcohol.  Get help right away if you have a lot of blood in your stool, nausea or vomiting, a fever, or increased pain in your abdomen. This information is not intended to replace advice given to you by your health care provider. Make sure you discuss any questions you have with your health care provider. Document Revised: 08/19/2018 Document Reviewed: 08/19/2018 Elsevier Patient Education  [place] After These instructions provide you with information about caring for yourself after your procedure. Your health care provider may also give you more specific instructions. Your treatment has been planned according to current medical practices, but problems sometimes occur. Call your health care provider if you have any problems or questions after your procedure. What can I expect after the procedure? After your procedure, you may:  Feel sleepy for several hours.  Feel clumsy and have poor balance for several hours.  Feel forgetful about what happened after the procedure.  Have poor judgment for several hours.  Feel nauseous or vomit.  Have a sore throat if you had a breathing tube during the procedure. Follow these instructions at home: For at least 24 hours after the procedure:      Have a responsible adult stay with you. It is important to have someone help care for you until you are awake and alert.  Rest as needed.  Do not: ? Participate in activities in which you could fall or become injured. ? Drive. ? Use heavy machinery. ? Drink alcohol. ? Take sleeping pills or medicines that cause drowsiness. ? Make important decisions or sign legal documents. ? Take  care of children on your own. Eating and drinking  Follow the diet that is recommended by your health care provider.  If you vomit, drink water, juice, or soup when you can drink without vomiting.  Make sure you have little or no nausea before eating solid foods. General instructions  Take over-the-counter and prescription medicines only as told by your health care provider.  If you have sleep apnea, surgery and certain medicines can increase your risk for breathing problems. Follow instructions from your health care provider about wearing your sleep device: ? Anytime you are sleeping, including during daytime naps. ? While taking prescription pain medicines, sleeping medicines, or medicines that make you drowsy.  If you smoke, do not smoke without supervision.  Keep all follow-up visits as told by your health care provider. This is important.  Contact a health care provider if:  You keep feeling nauseous or you keep vomiting.  You feel light-headed.  You develop a rash.  You have a fever. Get help right away if:  You have trouble breathing. Summary  For several hours after your procedure, you may feel sleepy and have poor judgment.  Have a responsible adult stay with you for at least 24 hours or until you are awake and alert. This information is not intended to replace advice given to you by your health care provider. Make sure you discuss any questions you have with your health care provider. Document Revised: 04/23/2017 Document Reviewed: 05/16/2015 Elsevier Patient Education  [place].

## 2019-12-11 ENCOUNTER — Encounter (HOSPITAL_COMMUNITY)
Admission: RE | Admit: 2019-12-11 | Discharge: 2019-12-11 | Disposition: A | Discharge status: Home / Self Care | Payer: Medicare Other | Source: Ambulatory Visit | Attending: Internal Medicine | Admitting: Internal Medicine

## 2019-12-11 ENCOUNTER — Other Ambulatory Visit: Payer: Self-pay

## 2019-12-11 ENCOUNTER — Encounter (HOSPITAL_COMMUNITY): Payer: Self-pay

## 2019-12-11 ENCOUNTER — Other Ambulatory Visit (HOSPITAL_COMMUNITY)
Admission: RE | Admit: 2019-12-11 | Discharge: 2019-12-11 | Disposition: A | Discharge status: Home / Self Care | Payer: Medicare Other | Source: Ambulatory Visit | Attending: Internal Medicine | Admitting: Internal Medicine

## 2019-12-11 DIAGNOSIS — Z20822 Contact with and (suspected) exposure to covid-19: Secondary | ICD-10-CM | POA: Insufficient documentation

## 2019-12-11 DIAGNOSIS — Z01818 Encounter for other preprocedural examination: Secondary | ICD-10-CM | POA: Insufficient documentation

## 2019-12-11 LAB — CBC WITH DIFFERENTIAL/PLATELET
Abs Immature Granulocytes: 0.03 10*3/uL (ref 0.00–0.07)
Basophils Absolute: 0 10*3/uL (ref 0.0–0.1)
Basophils Relative: 1 %
Eosinophils Absolute: 0.2 10*3/uL (ref 0.0–0.5)
Eosinophils Relative: 3 %
HCT: 37.9 % — ABNORMAL LOW (ref 39.0–52.0)
Hemoglobin: 12.2 g/dL — ABNORMAL LOW (ref 13.0–17.0)
Immature Granulocytes: 0 %
Lymphocytes Relative: 38 %
Lymphs Abs: 2.9 10*3/uL (ref 0.7–4.0)
MCH: 30.6 pg (ref 26.0–34.0)
MCHC: 32.2 g/dL (ref 30.0–36.0)
MCV: 95 fL (ref 80.0–100.0)
Monocytes Absolute: 0.6 10*3/uL (ref 0.1–1.0)
Monocytes Relative: 8 %
Neutro Abs: 3.7 10*3/uL (ref 1.7–7.7)
Neutrophils Relative %: 50 %
Platelets: 261 10*3/uL (ref 150–400)
RBC: 3.99 MIL/uL — ABNORMAL LOW (ref 4.22–5.81)
RDW: 14 % (ref 11.5–15.5)
WBC: 7.5 10*3/uL (ref 4.0–10.5)
nRBC: 0 % (ref 0.0–0.2)

## 2019-12-11 LAB — SARS CORONAVIRUS 2 (TAT 6-24 HRS): SARS Coronavirus 2: NEGATIVE

## 2019-12-15 ENCOUNTER — Ambulatory Visit (HOSPITAL_COMMUNITY): Payer: Medicare Other | Admitting: Anesthesiology

## 2019-12-15 ENCOUNTER — Other Ambulatory Visit: Payer: Self-pay

## 2019-12-15 ENCOUNTER — Encounter (HOSPITAL_COMMUNITY)
Admission: RE | Disposition: A | Discharge status: Home / Self Care | Payer: Self-pay | Source: Home / Self Care | Attending: Internal Medicine

## 2019-12-15 ENCOUNTER — Encounter (HOSPITAL_COMMUNITY): Payer: Self-pay | Admitting: Internal Medicine

## 2019-12-15 ENCOUNTER — Ambulatory Visit (HOSPITAL_COMMUNITY)
Admission: RE | Admit: 2019-12-15 | Discharge: 2019-12-15 | Disposition: A | Discharge status: Home / Self Care | Payer: Medicare Other | Attending: Internal Medicine | Admitting: Internal Medicine

## 2019-12-15 DIAGNOSIS — Z87891 Personal history of nicotine dependence: Secondary | ICD-10-CM | POA: Insufficient documentation

## 2019-12-15 DIAGNOSIS — Z86010 Personal history of colonic polyps: Secondary | ICD-10-CM | POA: Diagnosis not present

## 2019-12-15 DIAGNOSIS — K5730 Diverticulosis of large intestine without perforation or abscess without bleeding: Secondary | ICD-10-CM | POA: Insufficient documentation

## 2019-12-15 DIAGNOSIS — Z7982 Long term (current) use of aspirin: Secondary | ICD-10-CM | POA: Diagnosis not present

## 2019-12-15 DIAGNOSIS — Z09 Encounter for follow-up examination after completed treatment for conditions other than malignant neoplasm: Secondary | ICD-10-CM | POA: Diagnosis not present

## 2019-12-15 DIAGNOSIS — Z7902 Long term (current) use of antithrombotics/antiplatelets: Secondary | ICD-10-CM | POA: Insufficient documentation

## 2019-12-15 DIAGNOSIS — T184XXA Foreign body in colon, initial encounter: Secondary | ICD-10-CM | POA: Diagnosis not present

## 2019-12-15 DIAGNOSIS — X58XXXA Exposure to other specified factors, initial encounter: Secondary | ICD-10-CM | POA: Insufficient documentation

## 2019-12-15 DIAGNOSIS — Z1211 Encounter for screening for malignant neoplasm of colon: Secondary | ICD-10-CM | POA: Diagnosis not present

## 2019-12-15 DIAGNOSIS — Z7984 Long term (current) use of oral hypoglycemic drugs: Secondary | ICD-10-CM | POA: Diagnosis not present

## 2019-12-15 DIAGNOSIS — Z800 Family history of malignant neoplasm of digestive organs: Secondary | ICD-10-CM | POA: Insufficient documentation

## 2019-12-15 DIAGNOSIS — K635 Polyp of colon: Secondary | ICD-10-CM | POA: Diagnosis not present

## 2019-12-15 DIAGNOSIS — I10 Essential (primary) hypertension: Secondary | ICD-10-CM | POA: Diagnosis not present

## 2019-12-15 DIAGNOSIS — Z79899 Other long term (current) drug therapy: Secondary | ICD-10-CM | POA: Insufficient documentation

## 2019-12-15 HISTORY — PX: POLYPECTOMY: SHX5525

## 2019-12-15 HISTORY — PX: COLONOSCOPY WITH PROPOFOL: SHX5780

## 2019-12-15 LAB — GLUCOSE, CAPILLARY
Glucose-Capillary: 102 mg/dL — ABNORMAL HIGH (ref 70–99)
Glucose-Capillary: 84 mg/dL (ref 70–99)

## 2019-12-15 SURGERY — COLONOSCOPY WITH PROPOFOL
Anesthesia: General

## 2019-12-15 MED ORDER — LACTATED RINGERS IV SOLN: Freq: Once | INTRAVENOUS | Status: AC

## 2019-12-15 MED ORDER — PROPOFOL 10 MG/ML IV BOLUS
INTRAVENOUS | Status: DC | PRN
  Administered 2019-12-15: 20 mg via INTRAVENOUS
  Administered 2019-12-15: 80 mg via INTRAVENOUS

## 2019-12-15 MED ORDER — LACTATED RINGERS IV SOLN: INTRAVENOUS | Status: DC | PRN

## 2019-12-15 MED ORDER — EPHEDRINE SULFATE-NACL 50-0.9 MG/10ML-% IV SOSY
PREFILLED_SYRINGE | INTRAVENOUS | Status: DC | PRN
  Administered 2019-12-15 (×2): 10 mg via INTRAVENOUS
  Administered 2019-12-15: 5 mg via INTRAVENOUS

## 2019-12-15 MED ORDER — CHLORHEXIDINE GLUCONATE CLOTH 2 % EX PADS: 6.0000 | MEDICATED_PAD | Freq: Once | CUTANEOUS | Status: DC

## 2019-12-15 MED ORDER — LIDOCAINE HCL (CARDIAC) PF 100 MG/5ML IV SOSY
PREFILLED_SYRINGE | INTRAVENOUS | Status: DC | PRN
  Administered 2019-12-15: 50 mg via INTRAVENOUS

## 2019-12-15 MED ORDER — PHENYLEPHRINE 40 MCG/ML (10ML) SYRINGE FOR IV PUSH (FOR BLOOD PRESSURE SUPPORT)
PREFILLED_SYRINGE | INTRAVENOUS | Status: DC | PRN
  Administered 2019-12-15 (×2): 80 ug via INTRAVENOUS
  Administered 2019-12-15 (×2): 120 ug via INTRAVENOUS

## 2019-12-15 MED ORDER — PROPOFOL 500 MG/50ML IV EMUL
INTRAVENOUS | Status: DC | PRN
  Administered 2019-12-15: 100 ug/kg/min via INTRAVENOUS

## 2019-12-15 NOTE — Anesthesia Postprocedure Evaluation (Signed)
Anesthesia Post Note  Patient: Steven Stevens  Procedure(s) Performed: COLONOSCOPY WITH PROPOFOL (N/A ) POLYPECTOMY  Patient location during evaluation: Phase II Anesthesia Type: General Level of consciousness: awake and alert and oriented Pain management: pain level controlled Vital Signs Assessment: post-procedure vital signs reviewed and stable Respiratory status: spontaneous breathing, nonlabored ventilation and respiratory function stable Cardiovascular status: blood pressure returned to baseline and stable Postop Assessment: no apparent nausea or vomiting Anesthetic complications: no   No complications documented.   Last Vitals:  Vitals:   12/15/19 0900 12/15/19 0919  BP: 125/61 132/73  Pulse: (!) 58 60  Resp: 17   Temp:  36.6 C  SpO2: 95% 99%    Last Pain:  Vitals:   12/15/19 0919  TempSrc: Axillary  PainSc: 0-No pain                 Orlie Dakin

## 2019-12-15 NOTE — Op Note (Signed)
[facility] Patient Name: Steven Stevens Procedure Date: 12/15/2019 8:05 AM MRN: [id number] Date of Birth: [birth year] Attending MD: Norvel Richards , MD CSN: [id number] Age: 74 Admit Type: Outpatient Procedure:                Colonoscopy Indications:              High risk colon cancer surveillance: Personal                            history of colonic polyps Providers:                Norvel Richards, MD, Janeece Riggers, RN, Aram Candela Referring MD:              Medicines:                Propofol per Anesthesia Complications:            No immediate complications. Estimated Blood Loss:     Estimated blood loss was minimal. Procedure:                Pre-Anesthesia Assessment:                           - Prior to the procedure, a History and Physical                            was performed, and patient medications and                            allergies were reviewed. The patient's tolerance of                            previous anesthesia was also reviewed. The risks                            and benefits of the procedure and the sedation                            options and risks were discussed with the patient.                            All questions were answered, and informed consent                            was obtained. Prior Anticoagulants: The patient                            last took Plavix (clopidogrel) 1 day prior to the                            procedure. ASA Grade Assessment: III - A patient  with severe systemic disease. After reviewing the                            risks and benefits, the patient was deemed in                            satisfactory condition to undergo the procedure.                           After obtaining informed consent, the colonoscope                            was passed under direct vision. Throughout the                            procedure, the patient's blood  pressure, pulse, and                            oxygen saturations were monitored continuously. The                            CF-[id number] ([id number]) scope was introduced through                            the anus and advanced to the the cecum, identified                            by appendiceal orifice and ileocecal valve. The                            colonoscopy was performed without difficulty. The                            patient tolerated the procedure well. The quality                            of the bowel preparation was adequate. Scope In: 8:28:21 AM Scope Out: 8:44:13 AM Scope Withdrawal Time: 0 hours 10 minutes 58 seconds  Total Procedure Duration: 0 hours 15 minutes 52 seconds  Findings:      The perianal and digital rectal examinations were normal.      Scattered medium-mouthed diverticula were found in the entire colon.      A 3 mm polyp was found in the splenic flexure. The polyp was       semi-pedunculated. The polyp was removed with a cold snare. Resection       and retrieval were complete. Estimated blood loss was minimal.      The exam was otherwise without abnormality on direct and retroflexion       views. Impression:               - Diverticulosis in the entire examined colon.                           - One 3 mm polyp at the splenic flexure, removed  with a cold snare. Resected and retrieved.                           - The examination was otherwise normal on direct                            and retroflexion views. Moderate Sedation:      Moderate (conscious) sedation was personally administered by an       anesthesia professional. The following parameters were monitored: oxygen       saturation, heart rate, blood pressure, respiratory rate, EKG, adequacy       of pulmonary ventilation, and response to care. Recommendation:           - Patient has a contact number available for                            emergencies. The signs and  symptoms of potential                            delayed complications were discussed with the                            patient. Return to normal activities tomorrow.                            Written discharge instructions were provided to the                            patient.                           - Resume previous diet.                           - Continue present medications.                           - Repeat colonoscopy date to be determined after                            pending pathology results are reviewed for                            surveillance.                           - Return to GI office (date not yet determined). Procedure Code(s):        --- Professional ---                           [phone], Colonoscopy, flexible; with removal of                            tumor(s), polyp(s), or other lesion(s) by snare  technique Diagnosis Code(s):        --- Professional ---                           Z86.010, Personal history of colonic polyps                           K63.5, Polyp of colon                           K57.30, Diverticulosis of large intestine without                            perforation or abscess without bleeding CPT copyright 2019 American Medical Association. All rights reserved. The codes documented in this report are preliminary and upon coder review may  be revised to meet current compliance requirements. Cristopher Estimable. Bernadean Saling, MD Norvel Richards, MD 12/15/2019 8:50:59 AM This report has been signed electronically. Number of Addenda: 0

## 2019-12-15 NOTE — Transfer of Care (Signed)
Immediate Anesthesia Transfer of Care Note  Patient: Steven Stevens  Procedure(s) Performed: COLONOSCOPY WITH PROPOFOL (N/A ) POLYPECTOMY  Patient Location: PACU  Anesthesia Type:General  Level of Consciousness: awake, alert  and oriented  Airway & Oxygen Therapy: Patient Spontanous Breathing  Post-op Assessment: Report given to RN, Post -op Vital signs reviewed and stable and Patient moving all extremities X 4  Post vital signs: Reviewed and stable  Last Vitals:  Vitals Value Taken Time  BP 80/40 12/15/19 0850  Temp    Pulse 63 12/15/19 0852  Resp 20 12/15/19 0852  SpO2 95 % 12/15/19 0852  Vitals shown include unvalidated device data.  Last Pain:  Vitals:   12/15/19 0730  TempSrc: Oral  PainSc: 0-No pain         Complications: No complications documented.

## 2019-12-15 NOTE — Anesthesia Procedure Notes (Signed)
Date/Time: 12/15/2019 8:30 AM Performed by: Orlie Dakin, CRNA Pre-anesthesia Checklist: Patient identified, Emergency Drugs available, Suction available and Patient being monitored Patient Re-evaluated:Patient Re-evaluated prior to induction Oxygen Delivery Method: Non-rebreather mask Induction Type: IV induction Placement Confirmation: positive ETCO2

## 2019-12-15 NOTE — Discharge Instructions (Signed)
Colonoscopy Discharge Instructions  Read the instructions outlined below and refer to this sheet in the next few weeks. These discharge instructions provide you with general information on caring for yourself after you leave the hospital. Your doctor may also give you specific instructions. While your treatment has been planned according to the most current medical practices available, unavoidable complications occasionally occur. If you have any problems or questions after discharge, call Dr. Gala Romney at [phone]. ACTIVITY  You may resume your regular activity, but move at a slower pace for the next 24 hours.   Take frequent rest periods for the next 24 hours.   Walking will help get rid of the air and reduce the bloated feeling in your belly (abdomen).   No driving for 24 hours (because of the medicine (anesthesia) used during the test).    Do not sign any important legal documents or operate any machinery for 24 hours (because of the anesthesia used during the test).  NUTRITION  Drink plenty of fluids.   You may resume your normal diet as instructed by your doctor.   Begin with a light meal and progress to your normal diet. Heavy or fried foods are harder to digest and may make you feel sick to your stomach (nauseated).   Avoid alcoholic beverages for 24 hours or as instructed.  MEDICATIONS  You may resume your normal medications unless your doctor tells you otherwise.  WHAT YOU CAN EXPECT TODAY  Some feelings of bloating in the abdomen.   Passage of more gas than usual.   Spotting of blood in your stool or on the toilet paper.  IF YOU HAD POLYPS REMOVED DURING THE COLONOSCOPY:  No aspirin products for 7 days or as instructed.   No alcohol for 7 days or as instructed.   Eat a soft diet for the next 24 hours.  FINDING OUT THE RESULTS OF YOUR TEST Not all test results are available during your visit. If your test results are not back during the visit, make an appointment  with your caregiver to find out the results. Do not assume everything is normal if you have not heard from your caregiver or the medical facility. It is important for you to follow up on all of your test results.  SEEK IMMEDIATE MEDICAL ATTENTION IF:  You have more than a spotting of blood in your stool.   Your belly is swollen (abdominal distention).   You are nauseated or vomiting.   You have a temperature over 101.   You have abdominal pain or discomfort that is severe or gets worse throughout the day.    1 small polyp removed in your colon diverticulosis again found  Further recommendations to follow pending review of pathology report  Patient request, called wanting to [place] -reviewed results and recommendations      Colon Polyps  Polyps are tissue growths inside the body. Polyps can grow in many places, including the large intestine (colon). A polyp may be a round bump or a mushroom-shaped growth. You could have one polyp or several. Most colon polyps are noncancerous (benign). However, some colon polyps can become cancerous over time. Finding and removing the polyps early can help prevent this. What are the causes? The exact cause of colon polyps is not known. What increases the risk? You are more likely to develop this condition if you:  Have a family history of colon cancer or colon polyps.  Are older than 50 or older than 45 if you  are African American.  Have inflammatory bowel disease, such as ulcerative colitis or Crohn's disease.  Have certain hereditary conditions, such as: ? Familial adenomatous polyposis. ? Lynch syndrome. ? Turcot syndrome. ? Peutz-Jeghers syndrome.  Are overweight.  Smoke cigarettes.  Do not get enough exercise.  Drink too much alcohol.  Eat a diet that is high in fat and red meat and low in fiber.  Had childhood cancer that was treated with abdominal radiation. What are the signs or symptoms? Most polyps do not  cause symptoms. If you have symptoms, they may include:  Blood coming from your rectum when having a bowel movement.  Blood in your stool. The stool may look dark red or black.  Abdominal pain.  A change in bowel habits, such as constipation or diarrhea. How is this diagnosed? This condition is diagnosed with a colonoscopy. This is a procedure in which a lighted, flexible scope is inserted into the anus and then passed into the colon to examine the area. Polyps are sometimes found when a colonoscopy is done as part of routine cancer screening tests. How is this treated? Treatment for this condition involves removing any polyps that are found. Most polyps can be removed during a colonoscopy. Those polyps will then be tested for cancer. Additional treatment may be needed depending on the results of testing. Follow these instructions at home: Lifestyle  Maintain a healthy weight, or lose weight if recommended by your health care provider.  Exercise every day or as told by your health care provider.  Do not use any products that contain nicotine or tobacco, such as cigarettes and e-cigarettes. If you need help quitting, ask your health care provider.  If you drink alcohol, limit how much you have: ? 0-1 drink a day for women. ? 0-2 drinks a day for men.  Be aware of how much alcohol is in your drink. In the [place] one drink equals one 12 oz bottle of beer (355 mL), one 5 oz glass of wine (148 mL), or one 1 oz shot of hard liquor (44 mL). Eating and drinking   Eat foods that are high in fiber, such as fruits, vegetables, and whole grains.  Eat foods that are high in calcium and vitamin D, such as milk, cheese, yogurt, eggs, liver, fish, and broccoli.  Limit foods that are high in fat, such as fried foods and desserts.  Limit the amount of red meat and processed meat you eat, such as hot dogs, sausage, bacon, and lunch meats. General instructions  Keep all follow-up visits as told  by your health care provider. This is important. ? This includes having regularly scheduled colonoscopies. ? Talk to your health care provider about when you need a colonoscopy. Contact a health care provider if:  You have new or worsening bleeding during a bowel movement.  You have new or increased blood in your stool.  You have a change in bowel habits.  You lose weight for no known reason. Summary  Polyps are tissue growths inside the body. Polyps can grow in many places, including the colon.  Most colon polyps are noncancerous (benign), but some can become cancerous over time.  This condition is diagnosed with a colonoscopy.  Treatment for this condition involves removing any polyps that are found. Most polyps can be removed during a colonoscopy. This information is not intended to replace advice given to you by your health care provider. Make sure you discuss any questions you have with your health care  provider. Document Revised: 05/10/2017 Document Reviewed: 05/10/2017 Elsevier Patient Education  [place].     Diverticulosis  Diverticulosis is a condition that develops when small pouches (diverticula) form in the wall of the large intestine (colon). The colon is where water is absorbed and stool (feces) is formed. The pouches form when the inside layer of the colon pushes through weak spots in the outer layers of the colon. You may have a few pouches or many of them. The pouches usually do not cause problems unless they become inflamed or infected. When this happens, the condition is called diverticulitis. What are the causes? The cause of this condition is not known. What increases the risk? The following factors may make you more likely to develop this condition:  Being older than age 69. Your risk for this condition increases with age. Diverticulosis is rare among people younger than age 60. By age 30, many people have it.  Eating a low-fiber diet.  Having  frequent constipation.  Being overweight.  Not getting enough exercise.  Smoking.  Taking over-the-counter pain medicines, like aspirin and ibuprofen.  Having a family history of diverticulosis. What are the signs or symptoms? In most people, there are no symptoms of this condition. If you do have symptoms, they may include:  Bloating.  Cramps in the abdomen.  Constipation or diarrhea.  Pain in the lower left side of the abdomen. How is this diagnosed? Because diverticulosis usually has no symptoms, it is most often diagnosed during an exam for other colon problems. The condition may be diagnosed by:  Using a flexible scope to examine the colon (colonoscopy).  Taking an X-ray of the colon after dye has been put into the colon (barium enema).  Having a CT scan. How is this treated? You may not need treatment for this condition. Your health care provider may recommend treatment to prevent problems. You may need treatment if you have symptoms or if you previously had diverticulitis. Treatment may include:  Eating a high-fiber diet.  Taking a fiber supplement.  Taking a live bacteria supplement (probiotic).  Taking medicine to relax your colon. Follow these instructions at home: Medicines  Take over-the-counter and prescription medicines only as told by your health care provider.  If told by your health care provider, take a fiber supplement or probiotic. Constipation prevention Your condition may cause constipation. To prevent or treat constipation, you may need to:  Drink enough fluid to keep your urine pale yellow.  Take over-the-counter or prescription medicines.  Eat foods that are high in fiber, such as beans, whole grains, and fresh fruits and vegetables.  Limit foods that are high in fat and processed sugars, such as fried or sweet foods.  General instructions  Try not to strain when you have a bowel movement.  Keep all follow-up visits as told by your  health care provider. This is important. Contact a health care provider if you:  Have pain in your abdomen.  Have bloating.  Have cramps.  Have not had a bowel movement in 3 days. Get help right away if:  Your pain gets worse.  Your bloating becomes very bad.  You have a fever or chills, and your symptoms suddenly get worse.  You vomit.  You have bowel movements that are bloody or black.  You have bleeding from your rectum. Summary  Diverticulosis is a condition that develops when small pouches (diverticula) form in the wall of the large intestine (colon).  You may have a  few pouches or many of them.  This condition is most often diagnosed during an exam for other colon problems.  Treatment may include increasing the fiber in your diet, taking supplements, or taking medicines. This information is not intended to replace advice given to you by your health care provider. Make sure you discuss any questions you have with your health care provider. Document Revised: 08/22/2018 Document Reviewed: 08/22/2018 Elsevier Patient Education  [place] After These instructions provide you with information about caring for yourself after your procedure. Your health care provider may also give you more specific instructions. Your treatment has been planned according to current medical practices, but problems sometimes occur. Call your health care provider if you have any problems or questions after your procedure. What can I expect after the procedure? After your procedure, you may:  Feel sleepy for several hours.  Feel clumsy and have poor balance for several hours.  Feel forgetful about what happened after the procedure.  Have poor judgment for several hours.  Feel nauseous or vomit.  Have a sore throat if you had a breathing tube during the procedure. Follow these instructions at home: For at least 24 hours after the  procedure:      Have a responsible adult stay with you. It is important to have someone help care for you until you are awake and alert.  Rest as needed.  Do not: ? Participate in activities in which you could fall or become injured. ? Drive. ? Use heavy machinery. ? Drink alcohol. ? Take sleeping pills or medicines that cause drowsiness. ? Make important decisions or sign legal documents. ? Take care of children on your own. Eating and drinking  Follow the diet that is recommended by your health care provider.  If you vomit, drink water, juice, or soup when you can drink without vomiting.  Make sure you have little or no nausea before eating solid foods. General instructions  Take over-the-counter and prescription medicines only as told by your health care provider.  If you have sleep apnea, surgery and certain medicines can increase your risk for breathing problems. Follow instructions from your health care provider about wearing your sleep device: ? Anytime you are sleeping, including during daytime naps. ? While taking prescription pain medicines, sleeping medicines, or medicines that make you drowsy.  If you smoke, do not smoke without supervision.  Keep all follow-up visits as told by your health care provider. This is important. Contact a health care provider if:  You keep feeling nauseous or you keep vomiting.  You feel light-headed.  You develop a rash.  You have a fever. Get help right away if:  You have trouble breathing. Summary  For several hours after your procedure, you may feel sleepy and have poor judgment.  Have a responsible adult stay with you for at least 24 hours or until you are awake and alert. This information is not intended to replace advice given to you by your health care provider. Make sure you discuss any questions you have with your health care provider. Document Revised: 04/23/2017 Document Reviewed: 05/16/2015 Elsevier Patient  Education  [place].

## 2019-12-15 NOTE — Anesthesia Preprocedure Evaluation (Signed)
Anesthesia Evaluation  Patient identified by MRN, date of birth, ID band Patient awake    Reviewed: Allergy & Precautions, NPO status , Patient's Chart, lab work & pertinent test results  Airway Mallampati: III  TM Distance: >3 FB Neck ROM: Full    Dental  (+) Edentulous Upper, Partial Lower, Dental Advisory Given, Chipped   Pulmonary former smoker,    Pulmonary exam normal breath sounds clear to auscultation       Cardiovascular Exercise Tolerance: Good hypertension, Pt. on medications + CAD, + Past MI and + Cardiac Stents (2017)  Normal cardiovascular exam Rhythm:Regular Rate:Normal     Neuro/Psych PSYCHIATRIC DISORDERS Depression    GI/Hepatic negative GI ROS, Neg liver ROS,   Endo/Other  diabetes, Well Controlled, Type 2Morbid obesity  Renal/GU      Musculoskeletal  (+) Arthritis  (back sx, chronic back pain),   Abdominal   Peds  Hematology  (+) anemia ,   Anesthesia Other Findings   Reproductive/Obstetrics                            Anesthesia Physical Anesthesia Plan  ASA: III  Anesthesia Plan: General   Post-op Pain Management:    Induction: Intravenous  PONV Risk Score and Plan: TIVA  Airway Management Planned: Nasal Cannula and Natural Airway  Additional Equipment:   Intra-op Plan:   Post-operative Plan:   Informed Consent: I have reviewed the patients History and Physical, chart, labs and discussed the procedure including the risks, benefits and alternatives for the proposed anesthesia with the patient or authorized representative who has indicated his/her understanding and acceptance.     Dental advisory given  Plan Discussed with: CRNA and Surgeon  Anesthesia Plan Comments:         Anesthesia Quick Evaluation

## 2019-12-15 NOTE — H&P (Signed)
@LOGO @   Primary Care Physician:  Celene Squibb, MD Primary Gastroenterologist:  Dr. Gala Romney  Pre-Procedure History & Physical: HPI:  Steven Stevens is a 74 y.o. male here for surveillance colonoscopy.  History of multiple colonic adenomas removed 3 years ago.  Positive family history of colon cancer.  Past Medical History:  Diagnosis Date  . Chronic lower back pain   . Coronary artery disease   . Depression    "lost wife 01/14/2015"  . High cholesterol   . Hypertension   . Myocardial infarct ([place]) 01/27/2004  . Type II diabetes mellitus ([place])     Past Surgical History:  Procedure Laterality Date  . BACK SURGERY    . CARDIAC CATHETERIZATION N/A 03/30/2015   Procedure: Left Heart Cath and Coronary Angiography;  Surgeon: Jettie Booze, MD;  Location: [facility];  Service: Cardiovascular;  Laterality: N/A;  . CARDIAC CATHETERIZATION  03/30/2015   Procedure: Coronary Stent Intervention;  Surgeon: Jettie Booze, MD;  Location: [facility];  Service: Cardiovascular;;  . CARDIAC CATHETERIZATION N/A 04/14/2015   Procedure: Coronary/Bypass Graft CTO Intervention;  Surgeon: Jettie Booze, MD;  Location: [facility];  Service: Cardiovascular;  Laterality: N/A;  . CARDIAC CATHETERIZATION  04/14/2015   Procedure: Left Heart Cath and Coronary Angiography;  Surgeon: Jettie Booze, MD;  Location: [facility];  Service: Cardiovascular;;  . CATARACT EXTRACTION W/PHACO Right 12/03/2012   Procedure: CATARACT EXTRACTION RIGHT EYE (WITH PHACO) AND INTRAOCULAR LENS PLACEMENT  CDE=2.41;  Surgeon: Elta Guadeloupe [name], MD;  Location: [facility];  Service: Ophthalmology;  Laterality: Right;  . CATARACT EXTRACTION W/PHACO Left 12/17/2012   Procedure: CATARACT EXTRACTION PHACO AND INTRAOCULAR LENS PLACEMENT (IOC);  Surgeon: Elta Guadeloupe [name], MD;  Location: [facility];  Service: Ophthalmology;  Laterality: Left;  CDE:7.82  . COLONOSCOPY  2011   Dr. Gala Romney: diverticulosis, tubular  adenomas  . COLONOSCOPY WITH PROPOFOL N/A 07/24/2016   Sherrin Stahle: 3 tubular adenomas removed.  Diverticulosis.  3-year surveillance colonoscopy recommended.  . CORONARY ANGIOPLASTY WITH STENT PLACEMENT  2005  . CORONARY STENT PLACEMENT  03/30/2015   RCA    DES  . HERNIA REPAIR    . KNEE ARTHROSCOPY Left 2012  . [place]   "herniated disc"  . POLYPECTOMY  07/24/2016   Procedure: POLYPECTOMY;  Surgeon: Daneil Dolin, MD;  [facility];  Service: Endoscopy;;  ascending colon polyp, splenic flexure polyps times 2  . UMBILICAL HERNIA REPAIR  1990s  . YAG LASER APPLICATION Right 38/18/2993   Procedure: YAG LASER APPLICATION;  Surgeon: Elta Guadeloupe [name], MD;  Location: [facility];  Service: Ophthalmology;  Laterality: Right;    Prior to Admission medications   Medication Sig Start Date End Date Taking? Authorizing Provider  acetaminophen (TYLENOL) 500 MG tablet Take 250 mg by mouth daily.   Yes [provider]  ALPRAZolam Duanne Moron) 0.5 MG tablet Take 0.5 mg by mouth in the morning, at noon, and at bedtime.  09/08/19  Yes [provider]  aspirin EC 81 MG tablet Take 81 mg by mouth daily.   Yes [provider]  atorvastatin (LIPITOR) 40 MG tablet TAKE 1 TABLET BY MOUTH DAILY Patient taking differently: Take 40 mg by mouth daily in the afternoon.  06/20/16  Yes Lendon Colonel, NP  clopidogrel (PLAVIX) 75 MG tablet Take 1 tablet (75 mg total) by mouth daily. Patient taking differently: Take 75 mg by mouth daily in  the afternoon.  04/15/15  Yes Simmons, Brittainy M, PA-C  escitalopram (LEXAPRO) 10 MG tablet Take 10 mg by mouth daily in the afternoon. 10/06/19  Yes [provider]  glipiZIDE-metformin (METAGLIP) 2.5-500 MG tablet Take 1-2 tablets by mouth See admin instructions. Take 2 tablets by mouth in the morning & take 1 tablet by mouth at night. 06/23/19  Yes [provider]  HYDROcodone-acetaminophen (NORCO) 10-325 MG tablet Take 1  tablet by mouth in the morning, at noon, in the evening, and at bedtime.  09/08/19  Yes [provider]  LINZESS 145 MCG CAPS capsule Take 145 mcg by mouth daily as needed (constipation.).  06/05/19  Yes [provider]  losartan (COZAAR) 25 MG tablet TAKE 1 TABLET BY MOUTH EVERY DAY Patient taking differently: Take 25 mg by mouth daily in the afternoon.  08/25/16  Yes Lendon Colonel, NP  metoprolol tartrate (LOPRESSOR) 25 MG tablet Take 12.5 mg by mouth 2 (two) times daily.   Yes [provider]  Misc Natural Products (NEURIVA PO) Take 1 tablet by mouth daily in the afternoon.   Yes [provider]  terazosin (HYTRIN) 5 MG capsule Take 5 mg by mouth daily in the afternoon.    Yes [provider]  traZODone (DESYREL) 50 MG tablet Take 50 mg by mouth at bedtime.  06/28/16  Yes [provider]  hydrocortisone (ANUSOL-HC) 2.5 % rectal cream Place 1 application rectally 4 (four) times daily as needed (hemorrhoids/discomfort).  07/05/19   [provider]  nitroGLYCERIN (NITROSTAT) 0.4 MG SL tablet Place 1 tablet (0.4 mg total) under the tongue every 5 (five) minutes as needed for chest pain. Patient taking differently: Place 0.4 mg under the tongue every 5 (five) minutes x 3 doses as needed for chest pain.  04/15/15   Lyda Jester M, PA-C    Allergies as of 10/15/2019  . (No Known Allergies)    Family History  Problem Relation Age of Onset  . Colon cancer Mother        deceased age 48, diagnosed with CRC in 30s.    Social History   Socioeconomic History  . Marital status: Widowed    Spouse name: Not on file  . Number of children: Not on file  . Years of education: Not on file  . Highest education level: Not on file  Occupational History  . Not on file  Tobacco Use  . Smoking status: Former Smoker    Packs/day: 0.10    Years: 10.00    Pack years: 1.00    Types: Cigarettes    Quit date: 01/31/2004    Years since  quitting: 15.8  . Smokeless tobacco: Never Used  Vaping Use  . Vaping Use: Never used  Substance and Sexual Activity  . Alcohol use: No    Alcohol/week: 0.0 standard drinks  . Drug use: No  . Sexual activity: Never  Other Topics Concern  . Not on file  Social History Narrative  . Not on file   Social Determinants of Health   Financial Resource Strain:   . Difficulty of Paying Living Expenses: Not on file  Food Insecurity:   . Worried About Charity fundraiser in the Last Year: Not on file  . Ran Out of Food in the Last Year: Not on file  Transportation Needs:   . Lack of Transportation (Medical): Not on file  . Lack of Transportation (Non-Medical): Not on file  Physical Activity:   . Days  of Exercise per Week: Not on file  . Minutes of Exercise per Session: Not on file  Stress:   . Feeling of Stress : Not on file  Social Connections:   . Frequency of Communication with Friends and Family: Not on file  . Frequency of Social Gatherings with Friends and Family: Not on file  . Attends Religious Services: Not on file  . Active Member of Clubs or Organizations: Not on file  . Attends Archivist Meetings: Not on file  . Marital Status: Not on file  Intimate Partner Violence:   . Fear of Current or Ex-Partner: Not on file  . Emotionally Abused: Not on file  . Physically Abused: Not on file  . Sexually Abused: Not on file    Review of Systems: See HPI, otherwise negative ROS  Physical Exam: BP 137/70   Pulse 62   Temp 97.9 F (36.6 C) (Oral)   Resp 18   Ht 5' 8.05" (1.728 m)   Wt (!) 138.3 kg   SpO2 98%   BMI 46.29 kg/m  General:   Alert,  Well-developed, well-nourished, pleasant and cooperative in NAD Neck:  Supple; no masses or thyromegaly. No significant cervical adenopathy. Lungs:  Clear throughout to auscultation.   No wheezes, crackles, or rhonchi. No acute distress. Heart:  Regular rate and rhythm; no murmurs, clicks, rubs,  or gallops. Abdomen:  Non-distended, normal bowel sounds.  Soft and nontender without appreciable mass or hepatosplenomegaly.  Pulses:  Normal pulses noted. Extremities:  Without clubbing or edema.  Impression/Plan: 74 year old gentleman with a history of multiple colonic adenomas  -   here for surveillance colonoscopy.  The risks, benefits, limitations, alternatives and imponderables have been reviewed with the patient. Questions have been answered. All parties are agreeable.      Notice: This dictation was prepared with Dragon dictation along with smaller phrase technology. Any transcriptional errors that result from this process are unintentional and may not be corrected upon review.

## 2019-12-16 ENCOUNTER — Encounter: Payer: Self-pay | Admitting: Internal Medicine

## 2019-12-16 LAB — SURGICAL PATHOLOGY

## 2019-12-17 ENCOUNTER — Other Ambulatory Visit: Payer: Self-pay

## 2019-12-17 ENCOUNTER — Encounter: Payer: Self-pay | Admitting: Cardiology

## 2019-12-17 ENCOUNTER — Ambulatory Visit: Payer: Medicare Other | Admitting: Cardiology

## 2019-12-17 VITALS — BP 118/88 | HR 54 | Ht 68.5 in | Wt 305.0 lb

## 2019-12-17 DIAGNOSIS — E119 Type 2 diabetes mellitus without complications: Secondary | ICD-10-CM | POA: Diagnosis not present

## 2019-12-17 DIAGNOSIS — Z9861 Coronary angioplasty status: Secondary | ICD-10-CM

## 2019-12-17 DIAGNOSIS — I2510 Atherosclerotic heart disease of native coronary artery without angina pectoris: Secondary | ICD-10-CM

## 2019-12-17 DIAGNOSIS — R001 Bradycardia, unspecified: Secondary | ICD-10-CM

## 2019-12-17 DIAGNOSIS — I2582 Chronic total occlusion of coronary artery: Secondary | ICD-10-CM | POA: Diagnosis not present

## 2019-12-17 NOTE — Patient Instructions (Signed)
Your physician recommends that you schedule a follow-up appointment in: [place] has recommended you make the following change in your medication:   DECREASE METOPROLOL 12.5 MG (1/2 TABLET) DAILY   Thank you for choosing [facility]!!

## 2019-12-17 NOTE — Progress Notes (Signed)
Cardiology Office Note:    Date:  12/17/2019   ID:  Steven Stevens, DOB May 24, 1945, MRN [id number]  PCP:  Celene Squibb, MD  Cardiologist:  No primary care provider on file.  Electrophysiologist:  None   Referring MD: Celene Squibb, MD   No chief complaint on file.   History of Present Illness:    Steven Stevens is a pleasant 74 y.o.morbidly obese AA male with a hx of coronary disease status post RCA PCI in 2005 and 2017. His daughter is an MD in occupational health in [place]. Other medical issues include treated hypertension, non-insulin-dependent diabetes, and dyslipidemia. He apparently has sleep apnea, he says he tried CPAP for a month and it did not help so he stopped it. He recently underwent colonoscopy. Apparently he had some abnormality on his telemetry and was referred here. I suspect this was bradycardia as his baseline heart rate is 50. The patient denies any history of chest pain. He has shortness of breath with exertion but that is unchanged. He has not had syncope or presyncope. His weight is actually down a few pounds from his last office visit.  Past Medical History:  Diagnosis Date  . Chronic lower back pain   . Coronary artery disease   . Depression    "lost wife 01/14/2015"  . High cholesterol   . Hypertension   . Myocardial infarct ([place]) 01/27/2004  . Type II diabetes mellitus ([place])     Past Surgical History:  Procedure Laterality Date  . BACK SURGERY    . CARDIAC CATHETERIZATION N/A 03/30/2015   Procedure: Left Heart Cath and Coronary Angiography;  Surgeon: Jettie Booze, MD;  Location: [facility];  Service: Cardiovascular;  Laterality: N/A;  . CARDIAC CATHETERIZATION  03/30/2015   Procedure: Coronary Stent Intervention;  Surgeon: Jettie Booze, MD;  Location: [facility];  Service: Cardiovascular;;  . CARDIAC CATHETERIZATION N/A 04/14/2015   Procedure: Coronary/Bypass Graft CTO Intervention;  Surgeon: Jettie Booze, MD;  Location:  [place] [facility];  Service: Cardiovascular;  Laterality: N/A;  . CARDIAC CATHETERIZATION  04/14/2015   Procedure: Left Heart Cath and Coronary Angiography;  Surgeon: Jettie Booze, MD;  Location: [facility];  Service: Cardiovascular;;  . CATARACT EXTRACTION W/PHACO Right 12/03/2012   Procedure: CATARACT EXTRACTION RIGHT EYE (WITH PHACO) AND INTRAOCULAR LENS PLACEMENT  CDE=2.41;  Surgeon: Elta Guadeloupe [name], MD;  Location: [facility];  Service: Ophthalmology;  Laterality: Right;  . CATARACT EXTRACTION W/PHACO Left 12/17/2012   Procedure: CATARACT EXTRACTION PHACO AND INTRAOCULAR LENS PLACEMENT (IOC);  Surgeon: Elta Guadeloupe [name], MD;  Location: [facility];  Service: Ophthalmology;  Laterality: Left;  CDE:7.82  . COLONOSCOPY  2011   Dr. Gala Romney: diverticulosis, tubular adenomas  . COLONOSCOPY WITH PROPOFOL N/A 07/24/2016   Rourk: 3 tubular adenomas removed.  Diverticulosis.  3-year surveillance colonoscopy recommended.  . CORONARY ANGIOPLASTY WITH STENT PLACEMENT  2005  . CORONARY STENT PLACEMENT  03/30/2015   RCA    DES  . HERNIA REPAIR    . KNEE ARTHROSCOPY Left 2012  . [place]   "herniated disc"  . POLYPECTOMY  07/24/2016   Procedure: POLYPECTOMY;  Surgeon: Daneil Dolin, MD;  [facility];  Service: Endoscopy;;  ascending colon polyp, splenic flexure polyps times 2  . UMBILICAL HERNIA REPAIR  1990s  . YAG LASER APPLICATION Right 89/38/1017   Procedure: YAG LASER APPLICATION;  Surgeon: Elta Guadeloupe [name], MD;  Location: [facility];  Service: Ophthalmology;  Laterality: Right;    Current Medications: Current Meds  Medication Sig  . acetaminophen (TYLENOL) 500 MG tablet Take 250 mg by mouth daily.  Marland Kitchen ALPRAZolam (XANAX) 0.5 MG tablet Take 0.5 mg by mouth in the morning, at noon, and at bedtime.   Marland Kitchen aspirin EC 81 MG tablet Take 81 mg by mouth daily.  Marland Kitchen atorvastatin (LIPITOR) 40 MG tablet TAKE 1 TABLET BY MOUTH DAILY (Patient taking differently: Take 40 mg by mouth  daily in the afternoon. )  . clopidogrel (PLAVIX) 75 MG tablet Take 1 tablet (75 mg total) by mouth daily. (Patient taking differently: Take 75 mg by mouth daily in the afternoon. )  . escitalopram (LEXAPRO) 10 MG tablet Take 10 mg by mouth daily in the afternoon.  Marland Kitchen glipiZIDE-metformin (METAGLIP) 2.5-500 MG tablet Take 1-2 tablets by mouth See admin instructions. Take 2 tablets by mouth in the morning & take 1 tablet by mouth at night.  Marland Kitchen HYDROcodone-acetaminophen (NORCO) 10-325 MG tablet Take 1 tablet by mouth in the morning, at noon, in the evening, and at bedtime.   . hydrocortisone (ANUSOL-HC) 2.5 % rectal cream Place 1 application rectally 4 (four) times daily as needed (hemorrhoids/discomfort).   Marland Kitchen LINZESS 145 MCG CAPS capsule Take 145 mcg by mouth daily as needed (constipation.).   Marland Kitchen losartan (COZAAR) 25 MG tablet TAKE 1 TABLET BY MOUTH EVERY DAY (Patient taking differently: Take 25 mg by mouth daily in the afternoon. )  . metoprolol tartrate (LOPRESSOR) 25 MG tablet Take 12.5 mg by mouth daily.  . Misc Natural Products (NEURIVA PO) Take 1 tablet by mouth daily in the afternoon.  . nitroGLYCERIN (NITROSTAT) 0.4 MG SL tablet Place 1 tablet (0.4 mg total) under the tongue every 5 (five) minutes as needed for chest pain. (Patient taking differently: Place 0.4 mg under the tongue every 5 (five) minutes x 3 doses as needed for chest pain. )  . terazosin (HYTRIN) 5 MG capsule Take 5 mg by mouth daily in the afternoon.   . traZODone (DESYREL) 50 MG tablet Take 50 mg by mouth at bedtime.   . [DISCONTINUED] metoprolol tartrate (LOPRESSOR) 25 MG tablet Take 12.5 mg by mouth 2 (two) times daily.     Allergies:   Patient has no known allergies.   Social History   Socioeconomic History  . Marital status: Widowed    Spouse name: Not on file  . Number of children: Not on file  . Years of education: Not on file  . Highest education level: Not on file  Occupational History  . Not on file  Tobacco  Use  . Smoking status: Former Smoker    Packs/day: 0.10    Years: 10.00    Pack years: 1.00    Types: Cigarettes    Quit date: 01/31/2004    Years since quitting: 15.8  . Smokeless tobacco: Never Used  Vaping Use  . Vaping Use: Never used  Substance and Sexual Activity  . Alcohol use: No    Alcohol/week: 0.0 standard drinks  . Drug use: No  . Sexual activity: Never  Other Topics Concern  . Not on file  Social History Narrative  . Not on file   Social Determinants of Health   Financial Resource Strain:   . Difficulty of Paying Living Expenses: Not on file  Food Insecurity:   . Worried About Charity fundraiser in the Last Year: Not on file  . Ran Out of  Food in the Last Year: Not on file  Transportation Needs:   . Lack of Transportation (Medical): Not on file  . Lack of Transportation (Non-Medical): Not on file  Physical Activity:   . Days of Exercise per Week: Not on file  . Minutes of Exercise per Session: Not on file  Stress:   . Feeling of Stress : Not on file  Social Connections:   . Frequency of Communication with Friends and Family: Not on file  . Frequency of Social Gatherings with Friends and Family: Not on file  . Attends Religious Services: Not on file  . Active Member of Clubs or Organizations: Not on file  . Attends Archivist Meetings: Not on file  . Marital Status: Not on file     Family History: The patient's family history includes Colon cancer in his mother.  ROS:   Please see the history of present illness.     All other systems reviewed and are negative.  EKGs/Labs/Other Studies Reviewed:    The following studies were reviewed today: Cath/ PCI 2017  EKG:  EKG is ordered today.  The ekg ordered today demonstrates NSR, SB-50  Recent Labs: 12/11/2019: Hemoglobin 12.2; Platelets 261  Recent Lipid Panel    Component Value Date/Time   CHOL 162 12/26/2012 0826   TRIG 199 (H) 12/26/2012 0826   HDL 35 (L) 12/26/2012 0826   CHOLHDL  4.6 12/26/2012 0826   VLDL 40 12/26/2012 0826   LDLCALC 87 12/26/2012 0826    Physical Exam:    VS:  BP 118/88   Pulse (!) 54   Ht 5' 8.5" (1.74 m)   Wt (!) 305 lb (138.3 kg)   SpO2 98%   BMI 45.70 kg/m     Wt Readings from Last 3 Encounters:  12/17/19 (!) 305 lb (138.3 kg)  12/15/19 (!) 304 lb 14.3 oz (138.3 kg)  12/11/19 (!) 305 lb (138.3 kg)     GEN: morbidly obese AA male well developed in no acute distress HEENT: Normal NECK: No JVD; No carotid bruits LYMPHATICS: No lymphadenopathy CARDIAC:RRR, no murmurs, rubs, gallops RESPIRATORY:  Clear to auscultation without rales, wheezing or rhonchi  ABDOMEN: Obese, non-tender, non-distended MUSCULOSKELETAL:  No edema; No deformity  SKIN: Warm and dry NEUROLOGIC:  Alert and oriented x 3 PSYCHIATRIC:  Normal affect   ASSESSMENT:    Bradycardia- Decrease metoprolol to 12.5 mg daily  CAD- H/P RCA PCI in '05 and '17- no recent angina.  Morbid obesity It sounds like he was diagnosed with OSA in the past but didn't think C-pap helped.  He has lost a few pounds since LOV.  HTN- Controlled  NIDDM- Per PCP     PLAN:    Plan:  Decrease Lopressor to 12.5 mg daily.  F/U Dr Harrington Challenger in Feb 2022.     Medication Adjustments/Labs and Tests Ordered: Current medicines are reviewed at length with the patient today.  Concerns regarding medicines are outlined above.  No orders of the defined types were placed in this encounter.  No orders of the defined types were placed in this encounter.   Patient Instructions  Your physician recommends that you schedule a follow-up appointment in: [place] has recommended you make the following change in your medication:   DECREASE METOPROLOL 12.5 MG (1/2 TABLET) DAILY   Thank you for choosing [facility]!!        Signed, Kerin Ransom, PA-C  12/17/2019 10:06 AM  [facility]

## 2019-12-18 NOTE — Addendum Note (Signed)
Addended by: Levonne Hubert on: 12/18/2019 05:15 PM   Modules accepted: Orders

## 2019-12-19 ENCOUNTER — Encounter (HOSPITAL_COMMUNITY): Payer: Self-pay | Admitting: Internal Medicine

## 2020-01-12 ENCOUNTER — Encounter: Payer: Self-pay | Admitting: Nurse Practitioner

## 2020-01-12 ENCOUNTER — Other Ambulatory Visit: Payer: Self-pay

## 2020-01-12 ENCOUNTER — Ambulatory Visit (INDEPENDENT_AMBULATORY_CARE_PROVIDER_SITE_OTHER): Payer: Medicare Other | Admitting: Nurse Practitioner

## 2020-01-12 VITALS — BP 149/84 | HR 62 | Temp 98.8°F | Resp 16 | Ht 68.5 in | Wt 311.0 lb

## 2020-01-12 DIAGNOSIS — E785 Hyperlipidemia, unspecified: Secondary | ICD-10-CM | POA: Diagnosis not present

## 2020-01-12 DIAGNOSIS — K5903 Drug induced constipation: Secondary | ICD-10-CM

## 2020-01-12 DIAGNOSIS — I10 Essential (primary) hypertension: Secondary | ICD-10-CM

## 2020-01-12 DIAGNOSIS — Z7689 Persons encountering health services in other specified circumstances: Secondary | ICD-10-CM | POA: Insufficient documentation

## 2020-01-12 DIAGNOSIS — Z23 Encounter for immunization: Secondary | ICD-10-CM | POA: Diagnosis not present

## 2020-01-12 DIAGNOSIS — I2510 Atherosclerotic heart disease of native coronary artery without angina pectoris: Secondary | ICD-10-CM | POA: Diagnosis not present

## 2020-01-12 DIAGNOSIS — Z9861 Coronary angioplasty status: Secondary | ICD-10-CM

## 2020-01-12 DIAGNOSIS — G4700 Insomnia, unspecified: Secondary | ICD-10-CM

## 2020-01-12 DIAGNOSIS — F419 Anxiety disorder, unspecified: Secondary | ICD-10-CM

## 2020-01-12 DIAGNOSIS — E119 Type 2 diabetes mellitus without complications: Secondary | ICD-10-CM

## 2020-01-12 DIAGNOSIS — R5383 Other fatigue: Secondary | ICD-10-CM

## 2020-01-12 MED ORDER — TERAZOSIN HCL 5 MG PO CAPS
5.0000 mg | ORAL_CAPSULE | Freq: Every day | ORAL | 1 refills | Status: DC
Start: 2020-01-12 — End: 2020-04-02

## 2020-01-12 MED ORDER — TRAZODONE HCL 50 MG PO TABS
50.0000 mg | ORAL_TABLET | Freq: Every day | ORAL | 1 refills | Status: DC
Start: 2020-01-12 — End: 2020-03-23

## 2020-01-12 NOTE — Assessment & Plan Note (Addendum)
-  no issues with BP -taking losartan 25 mg daily -taking metoprolol 12.5 mg daily -taking terazosin 5 mg in the afternoon

## 2020-01-12 NOTE — Assessment & Plan Note (Signed)
-  no A1c to review today -takes glipizide-metformin 2.2/500 mg; takes 2 tablets in AM and 1 tablet in PM

## 2020-01-12 NOTE — Assessment & Plan Note (Signed)
-  takes narcotics for pain -uses linzess 145 mcg daily for constipation -no issues today

## 2020-01-12 NOTE — Assessment & Plan Note (Signed)
-  improved after increasing xanax -takes 0.5 mg TID PRN -taking lexapro 10 mg daily

## 2020-01-12 NOTE — Assessment & Plan Note (Signed)
-  taking trazodone 50 mg qhs

## 2020-01-12 NOTE — Assessment & Plan Note (Signed)
-  he states this started about 2 weeks ago -likely related to his cardiac issues -he changed his metoprolol from 12.5 mg BID to daily; but swapped back to BID after experiencing chest pain -will check TSH with next set of labs

## 2020-01-12 NOTE — Patient Instructions (Addendum)
It is great to see you again today.  We will check labs next month.  Please get fasting lab work prior to your visit.  I'll see you again in about 6 weeks. Additionally, the 2nd dose of the Shingrix shot will need to be administered in 2 months.  This can be a quick nurse's visit, but let's make sure this gets scheduled.

## 2020-01-12 NOTE — Assessment & Plan Note (Signed)
-  will get records from Dr. Juel Burrow -he would like shingles vaccine today as well

## 2020-01-12 NOTE — Assessment & Plan Note (Addendum)
-  no issues today -takes ASA and plavix -no recent chest pain, but has sublingual nitro PRN

## 2020-01-12 NOTE — Assessment & Plan Note (Signed)
-  Shingles vaccine provided today

## 2020-01-12 NOTE — Progress Notes (Signed)
New Patient Office Visit  Subjective:  Patient ID: Steven Stevens, male    DOB: 03-15-1945  Age: 74 y.o. MRN: [id number]  CC:  Chief Complaint  Patient presents with   New Patient (Initial Visit)    transfer of office    HPI Steven Stevens presents for new patient visit. Next set of labs due in January. Transferring care from Dr. Juel Burrow. He is interested in getting the shingles shot today. Needs refills on terazosin and trazodone.  He has chronic back pain. Takes narcotics for pain relief.  Past Medical History:  Diagnosis Date   Anxiety    Phreesia 01/11/2020   Chronic lower back pain    Coronary artery disease    Depression    "lost wife 01/14/2015"   Depression    Phreesia 01/11/2020   Diabetes mellitus without complication ([place])    Phreesia 01/11/2020   High cholesterol    Hyperlipidemia    Phreesia 01/11/2020   Hypertension    Myocardial infarct ([place]) 01/27/2004   Myocardial infarction ([place])    Phreesia 01/11/2020   Type II diabetes mellitus ([place])     Past Surgical History:  Procedure Laterality Date   BACK SURGERY     CARDIAC CATHETERIZATION N/A 03/30/2015   Procedure: Left Heart Cath and Coronary Angiography;  Surgeon: Jettie Booze, MD;  Location: [place] [facility];  Service: Cardiovascular;  Laterality: N/A;   CARDIAC CATHETERIZATION  03/30/2015   Procedure: Coronary Stent Intervention;  Surgeon: Jettie Booze, MD;  Location: [place] [facility];  Service: Cardiovascular;;   CARDIAC CATHETERIZATION N/A 04/14/2015   Procedure: Coronary/Bypass Graft CTO Intervention;  Surgeon: Jettie Booze, MD;  Location: [place] [facility];  Service: Cardiovascular;  Laterality: N/A;   CARDIAC CATHETERIZATION  04/14/2015   Procedure: Left Heart Cath and Coronary Angiography;  Surgeon: Jettie Booze, MD;  Location: [place] [facility];  Service: Cardiovascular;;   CATARACT EXTRACTION W/PHACO Right 12/03/2012   Procedure: CATARACT  EXTRACTION RIGHT EYE (WITH PHACO) AND INTRAOCULAR LENS PLACEMENT  CDE=2.41;  Surgeon: Elta Guadeloupe [name], MD;  Location: [facility];  Service: Ophthalmology;  Laterality: Right;   CATARACT EXTRACTION W/PHACO Left 12/17/2012   Procedure: CATARACT EXTRACTION PHACO AND INTRAOCULAR LENS PLACEMENT (IOC);  Surgeon: Elta Guadeloupe [name], MD;  Location: [facility];  Service: Ophthalmology;  Laterality: Left;  CDE:7.82   COLON SURGERY N/A    Phreesia 01/11/2020   COLONOSCOPY  2011   Dr. Gala Romney: diverticulosis, tubular adenomas   COLONOSCOPY WITH PROPOFOL N/A 07/24/2016   Rourk: 3 tubular adenomas removed.  Diverticulosis.  3-year surveillance colonoscopy recommended.   COLONOSCOPY WITH PROPOFOL N/A 12/15/2019   Procedure: COLONOSCOPY WITH PROPOFOL;  Surgeon: Daneil Dolin, MD;  [facility];  Service: Endoscopy;  Laterality: N/A;  8:30am   CORONARY ANGIOPLASTY WITH STENT PLACEMENT  2005   CORONARY STENT PLACEMENT  03/30/2015   RCA    DES   EYE SURGERY N/A    Phreesia 01/11/2020   HERNIA REPAIR     KNEE ARTHROSCOPY Left 2012   [place]   "herniated disc"   POLYPECTOMY  07/24/2016   Procedure: POLYPECTOMY;  Surgeon: Daneil Dolin, MD;  [facility];  Service: Endoscopy;;  ascending colon polyp, splenic flexure polyps times 2   POLYPECTOMY  12/15/2019   Procedure: POLYPECTOMY;  Surgeon: Daneil Dolin, MD;  [facility];  Service: Endoscopy;;   [place]  YAG LASER APPLICATION Right 16/57/9038   Procedure: YAG LASER APPLICATION;  Surgeon: Elta Guadeloupe [name], MD;  Location: [facility];  Service: Ophthalmology;  Laterality: Right;    Family History  Problem Relation Age of Onset   Colon cancer Mother        deceased age 92, diagnosed with CRC in 54s.    Social History   Socioeconomic History   Marital status: Widowed    Spouse name: Not on file   Number of children: Not on file   Years of education: Not on file   Highest  education level: Not on file  Occupational History   Not on file  Tobacco Use   Smoking status: Former Smoker    Packs/day: 0.10    Years: 10.00    Pack years: 1.00    Types: Cigarettes    Quit date: 01/31/2004    Years since quitting: 15.9   Smokeless tobacco: Never Used  Vaping Use   Vaping Use: Never used  Substance and Sexual Activity   Alcohol use: No    Alcohol/week: 0.0 standard drinks   Drug use: No   Sexual activity: Never  Other Topics Concern   Not on file  Social History Narrative   Not on file   Social Determinants of Health   Financial Resource Strain:    Difficulty of Paying Living Expenses: Not on file  Food Insecurity:    Worried About Charity fundraiser in the Last Year: Not on file   [employer] of Food in the Last Year: Not on file  Transportation Needs:    Lack of Transportation (Medical): Not on file   Lack of Transportation (Non-Medical): Not on file  Physical Activity:    Days of Exercise per Week: Not on file   Minutes of Exercise per Session: Not on file  Stress:    Feeling of Stress : Not on file  Social Connections:    Frequency of Communication with Friends and Family: Not on file   Frequency of Social Gatherings with Friends and Family: Not on file   Attends Religious Services: Not on file   Active Member of Clubs or Organizations: Not on file   Attends Archivist Meetings: Not on file   Marital Status: Not on file  Intimate Partner Violence:    Fear of Current or Ex-Partner: Not on file   Emotionally Abused: Not on file   Physically Abused: Not on file   Sexually Abused: Not on file    ROS Review of Systems  Constitutional: Negative.   Respiratory: Negative.   Cardiovascular: Negative.        No chest pain today, but he states he has this occasionally; was addressed by cardiology  Musculoskeletal: Positive for back pain.       Chronic back pain  Psychiatric/Behavioral:       Anxiety has  improved since increasing his xanax    Objective:   Today's Vitals: BP (!) 149/84    Pulse 62    Temp 98.8 F (37.1 C)    Resp 16    Ht 5' 8.5" (1.74 m)    Wt (!) 311 lb (141.1 kg)    SpO2 95%    BMI 46.60 kg/m   Physical Exam Constitutional:      Appearance: He is obese.  Cardiovascular:     Rate and Rhythm: Normal rate and regular rhythm.     Pulses: Normal pulses.     Heart sounds: Normal heart  sounds.  Pulmonary:     Effort: Pulmonary effort is normal.     Breath sounds: Normal breath sounds.  Musculoskeletal:        General: Normal range of motion.  Neurological:     General: No focal deficit present.     Mental Status: He is alert and oriented to person, place, and time.     Assessment & Plan:   Problem List Items Addressed This Visit      Cardiovascular and Mediastinum   CAD S/P RCA DES '05 and 03/11/15    -no issues today -takes ASA and plavix -no recent chest pain, but has sublingual nitro PRN      Relevant Medications   terazosin (HYTRIN) 5 MG capsule   Other Relevant Orders   CBC with Differential/Platelet   CMP14+EGFR   Lipid Panel With LDL/HDL Ratio   Essential hypertension    -no issues with BP -taking losartan 25 mg daily -taking metoprolol 12.5 mg daily -taking terazosin 5 mg in the afternoon      Relevant Medications   terazosin (HYTRIN) 5 MG capsule     Endocrine   Type 2 diabetes mellitus treated without insulin ([facility])    -no A1c to review today -takes glipizide-metformin 2.2/500 mg; takes 2 tablets in AM and 1 tablet in PM      Relevant Orders   Hemoglobin A1c     Other   Dyslipidemia - Primary    -no labs to review today -will draw them prior to the next visit -goal LDL < 70      Relevant Orders   Lipid Panel With LDL/HDL Ratio   Constipation    -takes narcotics for pain -uses linzess 145 mcg daily for constipation -no issues today      Insomnia, unspecified    -taking trazodone 50 mg qhs      Anxiety    -improved  after increasing xanax -takes 0.5 mg TID PRN -taking lexapro 10 mg daily      Relevant Medications   traZODone (DESYREL) 50 MG tablet   Encounter to establish care    -will get records from Dr. Juel Burrow -he would like shingles vaccine today as well      Relevant Orders   HCV Ab w/Rflx to Verification   Fatigue    -he states this started about 2 weeks ago -likely related to his cardiac issues -he changed his metoprolol from 12.5 mg BID to daily; but swapped back to BID after experiencing chest pain -will check TSH with next set of labs      Relevant Orders   TSH + free T4   Immunization due    -Shingles vaccine provided today      Relevant Orders   Varicella-zoster vaccine IM (Completed)      Outpatient Encounter Medications as of 01/12/2020  Medication Sig   acetaminophen (TYLENOL) 500 MG tablet Take 250 mg by mouth daily.   ALPRAZolam (XANAX) 0.5 MG tablet Take 0.5 mg by mouth in the morning, at noon, and at bedtime.    aspirin EC 81 MG tablet Take 81 mg by mouth daily.   atorvastatin (LIPITOR) 40 MG tablet TAKE 1 TABLET BY MOUTH DAILY (Patient taking differently: Take 40 mg by mouth daily in the afternoon. )   clopidogrel (PLAVIX) 75 MG tablet Take 1 tablet (75 mg total) by mouth daily. (Patient taking differently: Take 75 mg by mouth daily in the afternoon. )   escitalopram (LEXAPRO) 10 MG tablet Take 10  mg by mouth daily in the afternoon.   glipiZIDE-metformin (METAGLIP) 2.5-500 MG tablet Take 1-2 tablets by mouth See admin instructions. Take 2 tablets by mouth in the morning & take 1 tablet by mouth at night.   HYDROcodone-acetaminophen (NORCO) 10-325 MG tablet Take 1 tablet by mouth in the morning, at noon, in the evening, and at bedtime.    hydrocortisone (ANUSOL-HC) 2.5 % rectal cream Place 1 application rectally 4 (four) times daily as needed (hemorrhoids/discomfort).    LINZESS 145 MCG CAPS capsule Take 145 mcg by mouth daily as needed (constipation.).     losartan (COZAAR) 25 MG tablet TAKE 1 TABLET BY MOUTH EVERY DAY (Patient taking differently: Take 25 mg by mouth daily in the afternoon. )   metoprolol tartrate (LOPRESSOR) 25 MG tablet Take 12.5 mg by mouth daily.   Misc Natural Products (NEURIVA PO) Take 1 tablet by mouth daily in the afternoon.   nitroGLYCERIN (NITROSTAT) 0.4 MG SL tablet Place 1 tablet (0.4 mg total) under the tongue every 5 (five) minutes as needed for chest pain. (Patient taking differently: Place 0.4 mg under the tongue every 5 (five) minutes x 3 doses as needed for chest pain. )   terazosin (HYTRIN) 5 MG capsule Take 1 capsule (5 mg total) by mouth daily in the afternoon.   traZODone (DESYREL) 50 MG tablet Take 1 tablet (50 mg total) by mouth at bedtime.   [DISCONTINUED] terazosin (HYTRIN) 5 MG capsule Take 5 mg by mouth daily in the afternoon.    [DISCONTINUED] traZODone (DESYREL) 50 MG tablet Take 50 mg by mouth at bedtime.    No facility-administered encounter medications on file as of 01/12/2020.    Follow-up: Return in about 6 weeks (around 02/23/2020) for Lab Follow-up.   [name], NP

## 2020-01-12 NOTE — Assessment & Plan Note (Addendum)
-  no labs to review today -will draw them prior to the next visit -goal LDL < 70

## 2020-01-27 ENCOUNTER — Other Ambulatory Visit: Payer: Self-pay | Admitting: *Deleted

## 2020-01-27 ENCOUNTER — Telehealth: Payer: Self-pay

## 2020-01-27 ENCOUNTER — Other Ambulatory Visit: Payer: Self-pay | Admitting: Nurse Practitioner

## 2020-01-27 MED ORDER — GLIPIZIDE-METFORMIN HCL 2.5-500 MG PO TABS
1.0000 | ORAL_TABLET | ORAL | 0 refills | Status: DC
Start: 2020-01-27 — End: 2020-01-27

## 2020-01-27 NOTE — Telephone Encounter (Signed)
Please send this to [address] [facility]  Glipizide Metformin

## 2020-01-27 NOTE — Telephone Encounter (Signed)
Medication sent to pt pharmacy 

## 2020-02-04 ENCOUNTER — Telehealth: Payer: Self-pay

## 2020-02-04 ENCOUNTER — Other Ambulatory Visit: Payer: Self-pay | Admitting: Nurse Practitioner

## 2020-02-04 MED ORDER — HYDROCODONE-ACETAMINOPHEN 10-325 MG PO TABS: 1.0000 | ORAL_TABLET | Freq: Four times a day (QID) | ORAL | 0 refills | Status: AC

## 2020-02-04 NOTE — Telephone Encounter (Signed)
sent 

## 2020-02-04 NOTE — Telephone Encounter (Signed)
Pt informed

## 2020-02-04 NOTE — Telephone Encounter (Signed)
Pt called and states he needs a refill on his Hydrocodone, states he runs out today. Uses [facility] on [address].

## 2020-02-09 ENCOUNTER — Telehealth: Payer: Self-pay

## 2020-02-09 ENCOUNTER — Other Ambulatory Visit: Payer: Self-pay | Admitting: Nurse Practitioner

## 2020-02-09 MED ORDER — ALPRAZOLAM 0.5 MG PO TABS
0.5000 mg | ORAL_TABLET | Freq: Three times a day (TID) | ORAL | 2 refills | Status: DC
Start: 2020-02-09 — End: 2020-05-11

## 2020-02-09 NOTE — Telephone Encounter (Signed)
Pt informed

## 2020-02-09 NOTE — Telephone Encounter (Signed)
sent 

## 2020-02-09 NOTE — Telephone Encounter (Signed)
Please refill.

## 2020-02-09 NOTE — Telephone Encounter (Signed)
Patient needing refill on xanax sent to [facility] on [address] [phone]

## 2020-02-11 DIAGNOSIS — E785 Hyperlipidemia, unspecified: Secondary | ICD-10-CM | POA: Diagnosis not present

## 2020-02-11 DIAGNOSIS — Z7689 Persons encountering health services in other specified circumstances: Secondary | ICD-10-CM | POA: Diagnosis not present

## 2020-02-11 DIAGNOSIS — E119 Type 2 diabetes mellitus without complications: Secondary | ICD-10-CM | POA: Diagnosis not present

## 2020-02-11 DIAGNOSIS — Z9861 Coronary angioplasty status: Secondary | ICD-10-CM | POA: Diagnosis not present

## 2020-02-11 DIAGNOSIS — I2510 Atherosclerotic heart disease of native coronary artery without angina pectoris: Secondary | ICD-10-CM | POA: Diagnosis not present

## 2020-02-12 LAB — CMP14+EGFR
ALT: 21 IU/L (ref 0–44)
AST: 20 IU/L (ref 0–40)
Albumin/Globulin Ratio: 1.5 (ref 1.2–2.2)
Albumin: 4.2 g/dL (ref 3.7–4.7)
Alkaline Phosphatase: 39 IU/L — ABNORMAL LOW (ref 44–121)
BUN/Creatinine Ratio: 11 (ref 10–24)
BUN: 13 mg/dL (ref 8–27)
Bilirubin Total: 0.9 mg/dL (ref 0.0–1.2)
CO2: 25 mmol/L (ref 20–29)
Calcium: 8.9 mg/dL (ref 8.6–10.2)
Chloride: 105 mmol/L (ref 96–106)
Creatinine, Ser: 1.23 mg/dL (ref 0.76–1.27)
GFR calc Af Amer: 66 mL/min/{1.73_m2} (ref >59)
GFR calc non Af Amer: 57 mL/min/{1.73_m2} — ABNORMAL LOW (ref >59)
Globulin, Total: 2.8 g/dL (ref 1.5–4.5)
Glucose: 103 mg/dL — ABNORMAL HIGH (ref 65–99)
Potassium: 4.9 mmol/L (ref 3.5–5.2)
Sodium: 143 mmol/L (ref 134–144)
Total Protein: 7 g/dL (ref 6.0–8.5)

## 2020-02-12 LAB — CBC WITH DIFFERENTIAL/PLATELET
Basophils Absolute: 0 10*3/uL (ref 0.0–0.2)
Basos: 1 %
EOS (ABSOLUTE): 0.2 10*3/uL (ref 0.0–0.4)
Eos: 3 %
Hematocrit: 38 % (ref 37.5–51.0)
Hemoglobin: 12.4 g/dL — ABNORMAL LOW (ref 13.0–17.7)
Immature Grans (Abs): 0 10*3/uL (ref 0.0–0.1)
Immature Granulocytes: 0 %
Lymphocytes Absolute: 2.5 10*3/uL (ref 0.7–3.1)
Lymphs: 37 %
MCH: 30.2 pg (ref 26.6–33.0)
MCHC: 32.6 g/dL (ref 31.5–35.7)
MCV: 93 fL (ref 79–97)
Monocytes Absolute: 0.6 10*3/uL (ref 0.1–0.9)
Monocytes: 8 %
Neutrophils Absolute: 3.6 10*3/uL (ref 1.4–7.0)
Neutrophils: 51 %
Platelets: 235 10*3/uL (ref 150–450)
RBC: 4.1 x10E6/uL — ABNORMAL LOW (ref 4.14–5.80)
RDW: 13.4 % (ref 11.6–15.4)
WBC: 6.9 10*3/uL (ref 3.4–10.8)

## 2020-02-12 LAB — TSH+FREE T4
Free T4: 1.09 ng/dL (ref 0.82–1.77)
TSH: 1.56 u[IU]/mL (ref 0.450–4.500)

## 2020-02-12 LAB — HEMOGLOBIN A1C
Est. average glucose Bld gHb Est-mCnc: 148 mg/dL
Hgb A1c MFr Bld: 6.8 % — ABNORMAL HIGH (ref 4.8–5.6)

## 2020-02-12 LAB — LIPID PANEL WITH LDL/HDL RATIO
Cholesterol, Total: 151 mg/dL (ref 100–199)
HDL: 45 mg/dL (ref >39)
LDL Chol Calc (NIH): 84 mg/dL (ref 0–99)
LDL/HDL Ratio: 1.9 ratio (ref 0.0–3.6)
Triglycerides: 122 mg/dL (ref 0–149)
VLDL Cholesterol Cal: 22 mg/dL (ref 5–40)

## 2020-02-12 LAB — HCV INTERPRETATION

## 2020-02-12 LAB — HCV AB W/RFLX TO VERIFICATION: HCV Ab: <0.1 s/co ratio (ref 0.0–0.9)

## 2020-02-12 NOTE — Progress Notes (Signed)
His labs look good. We will discus them at his appointment on 02/23/20. His A1c is less than 7 at 6.8.

## 2020-02-23 ENCOUNTER — Encounter: Payer: Self-pay | Admitting: Nurse Practitioner

## 2020-02-23 ENCOUNTER — Telehealth (INDEPENDENT_AMBULATORY_CARE_PROVIDER_SITE_OTHER): Payer: Medicare Other | Admitting: Nurse Practitioner

## 2020-02-23 ENCOUNTER — Other Ambulatory Visit: Payer: Self-pay

## 2020-02-23 DIAGNOSIS — E119 Type 2 diabetes mellitus without complications: Secondary | ICD-10-CM

## 2020-02-23 DIAGNOSIS — F419 Anxiety disorder, unspecified: Secondary | ICD-10-CM | POA: Diagnosis not present

## 2020-02-23 DIAGNOSIS — G4700 Insomnia, unspecified: Secondary | ICD-10-CM | POA: Diagnosis not present

## 2020-02-23 NOTE — Assessment & Plan Note (Signed)
-  no issues today -continue trazodone

## 2020-02-23 NOTE — Assessment & Plan Note (Addendum)
Lab Results  Component Value Date   HGBA1C 6.8 (H) 02/11/2020   -goal A1c <7.0; so he is at goal -continue current meds; glipizide-metformin 2.5/500 mg 2 tablets in AM and 1 in PM

## 2020-02-23 NOTE — Assessment & Plan Note (Signed)
-  no issues today -continue xanax 0.5 mg TID PRN -continue lexapro 10 mg daily

## 2020-02-23 NOTE — Progress Notes (Signed)
Established Patient Office Visit  Subjective:  Patient ID: Steven Stevens, male    DOB: Jun 01, 1945  Age: 75 y.o. MRN: [id number]  CC:  Chief Complaint  Patient presents with  . Follow-up    Go over lab results.     HPI Steven Stevens presents for lab follow-up. No acute issues.  Past Medical History:  Diagnosis Date  . Anxiety    Phreesia 01/11/2020  . Chronic lower back pain   . Coronary artery disease   . Depression    "lost wife 01/14/2015"  . Depression    Phreesia 01/11/2020  . Diabetes mellitus without complication ([place])    Phreesia 01/11/2020  . High cholesterol   . Hyperlipidemia    Phreesia 01/11/2020  . Hypertension   . Myocardial infarct ([place]) 01/27/2004  . Myocardial infarction ([place])    Phreesia 01/11/2020  . Type II diabetes mellitus ([place])     Past Surgical History:  Procedure Laterality Date  . BACK SURGERY    . CARDIAC CATHETERIZATION N/A 03/30/2015   Procedure: Left Heart Cath and Coronary Angiography;  Surgeon: Jettie Booze, MD;  Location: [facility];  Service: Cardiovascular;  Laterality: N/A;  . CARDIAC CATHETERIZATION  03/30/2015   Procedure: Coronary Stent Intervention;  Surgeon: Jettie Booze, MD;  Location: [facility] [facility];  Service: Cardiovascular;;  . CARDIAC CATHETERIZATION N/A 04/14/2015   Procedure: Coronary/Bypass Graft CTO Intervention;  Surgeon: Jettie Booze, MD;  Location: [facility] [facility];  Service: Cardiovascular;  Laterality: N/A;  . CARDIAC CATHETERIZATION  04/14/2015   Procedure: Left Heart Cath and Coronary Angiography;  Surgeon: Jettie Booze, MD;  Location: [facility];  Service: Cardiovascular;;  . CATARACT EXTRACTION W/PHACO Right 12/03/2012   Procedure: CATARACT EXTRACTION RIGHT EYE (WITH PHACO) AND INTRAOCULAR LENS PLACEMENT  CDE=2.41;  Surgeon: Elta Guadeloupe [name], MD;  Location: [facility];  Service: Ophthalmology;  Laterality: Right;  . CATARACT EXTRACTION W/PHACO Left 12/17/2012    Procedure: CATARACT EXTRACTION PHACO AND INTRAOCULAR LENS PLACEMENT (IOC);  Surgeon: Elta Guadeloupe [name], MD;  Location: [facility];  Service: Ophthalmology;  Laterality: Left;  CDE:7.82  . COLON SURGERY N/A    Phreesia 01/11/2020  . COLONOSCOPY  2011   Dr. Gala Romney: diverticulosis, tubular adenomas  . COLONOSCOPY WITH PROPOFOL N/A 07/24/2016   Rourk: 3 tubular adenomas removed.  Diverticulosis.  3-year surveillance colonoscopy recommended.  . COLONOSCOPY WITH PROPOFOL N/A 12/15/2019   Procedure: COLONOSCOPY WITH PROPOFOL;  Surgeon: Daneil Dolin, MD;  [facility];  Service: Endoscopy;  Laterality: N/A;  8:30am  . CORONARY ANGIOPLASTY WITH STENT PLACEMENT  2005  . CORONARY STENT PLACEMENT  03/30/2015   RCA    DES  . EYE SURGERY N/A    Phreesia 01/11/2020  . HERNIA REPAIR    . KNEE ARTHROSCOPY Left 2012  . [place]   "herniated disc"  . POLYPECTOMY  07/24/2016   Procedure: POLYPECTOMY;  Surgeon: Daneil Dolin, MD;  [facility];  Service: Endoscopy;;  ascending colon polyp, splenic flexure polyps times 2  . POLYPECTOMY  12/15/2019   Procedure: POLYPECTOMY;  Surgeon: Daneil Dolin, MD;  [facility];  Service: Endoscopy;;  . UMBILICAL HERNIA REPAIR  1990s  . YAG LASER APPLICATION Right [id number]   Procedure: YAG LASER APPLICATION;  Surgeon: Elta Guadeloupe [name], MD;  Location: [facility];  Service: Ophthalmology;  Laterality: Right;    Family History  Problem Relation Age  of Onset  . Colon cancer Mother        deceased age 75, diagnosed with CRC in 48s.    Social History   Socioeconomic History  . Marital status: Widowed    Spouse name: Not on file  . Number of children: Not on file  . Years of education: Not on file  . Highest education level: Not on file  Occupational History  . Not on file  Tobacco Use  . Smoking status: Former Smoker    Packs/day: 0.10    Years: 10.00    Pack years: 1.00    Types: Cigarettes    Quit date: 01/31/2004     Years since quitting: 16.0  . Smokeless tobacco: Never Used  Vaping Use  . Vaping Use: Never used  Substance and Sexual Activity  . Alcohol use: No    Alcohol/week: 0.0 standard drinks  . Drug use: No  . Sexual activity: Never  Other Topics Concern  . Not on file  Social History Narrative  . Not on file   Social Determinants of Health   Financial Resource Strain: Not on file  Food Insecurity: Not on file  Transportation Needs: Not on file  Physical Activity: Not on file  Stress: Not on file  Social Connections: Not on file  Intimate Partner Violence: Not on file    Outpatient Medications Prior to Visit  Medication Sig Dispense Refill  . acetaminophen (TYLENOL) 500 MG tablet Take 250 mg by mouth daily.    Marland Kitchen ALPRAZolam (XANAX) 0.5 MG tablet Take 1 tablet (0.5 mg total) by mouth in the morning, at noon, and at bedtime. 90 tablet 2  . aspirin EC 81 MG tablet Take 81 mg by mouth daily.    Marland Kitchen atorvastatin (LIPITOR) 40 MG tablet TAKE 1 TABLET BY MOUTH DAILY (Patient taking differently: Take 40 mg by mouth daily in the afternoon.) 30 tablet 0  . clopidogrel (PLAVIX) 75 MG tablet Take 1 tablet (75 mg total) by mouth daily. (Patient taking differently: Take 75 mg by mouth daily in the afternoon.) 30 tablet 11  . escitalopram (LEXAPRO) 10 MG tablet Take 10 mg by mouth daily in the afternoon.    Marland Kitchen glipiZIDE-metformin (METAGLIP) 2.5-500 MG tablet TAKE 2 TABLETS BY MOUTH IN THE MORNING AND 1 TABLET AT NIGHT 270 tablet 1  . HYDROcodone-acetaminophen (NORCO) 10-325 MG tablet Take 1 tablet by mouth in the morning, at noon, in the evening, and at bedtime. 120 tablet 0  . hydrocortisone (ANUSOL-HC) 2.5 % rectal cream Place 1 application rectally 4 (four) times daily as needed (hemorrhoids/discomfort).     Marland Kitchen LINZESS 145 MCG CAPS capsule Take 145 mcg by mouth daily as needed (constipation.).     Marland Kitchen losartan (COZAAR) 25 MG tablet TAKE 1 TABLET BY MOUTH EVERY DAY (Patient taking differently: Take 25  mg by mouth daily in the afternoon.) 15 tablet 0  . metoprolol tartrate (LOPRESSOR) 25 MG tablet Take 12.5 mg by mouth daily.    . Misc Natural Products (NEURIVA PO) Take 1 tablet by mouth daily in the afternoon.    . nitroGLYCERIN (NITROSTAT) 0.4 MG SL tablet Place 1 tablet (0.4 mg total) under the tongue every 5 (five) minutes as needed for chest pain. (Patient taking differently: Place 0.4 mg under the tongue every 5 (five) minutes x 3 doses as needed for chest pain.) 25 tablet 2  . terazosin (HYTRIN) 5 MG capsule Take 1 capsule (5 mg total) by mouth daily in the afternoon. [place]  capsule 1  . traZODone (DESYREL) 50 MG tablet Take 1 tablet (50 mg total) by mouth at bedtime. 90 tablet 1   No facility-administered medications prior to visit.    No Known Allergies  ROS Review of Systems    Objective:    Physical Exam  There were no vitals taken for this visit. Wt Readings from Last 3 Encounters:  01/12/20 (!) 311 lb (141.1 kg)  12/17/19 (!) 305 lb (138.3 kg)  12/15/19 (!) 304 lb 14.3 oz (138.3 kg)     Health Maintenance Due  Topic Date Due  . COVID-19 Vaccine (1) Never done  . FOOT EXAM  Never done  . OPHTHALMOLOGY EXAM  Never done  . PNA vac Low Risk Adult (1 of 2 - PCV13) Never done  . INFLUENZA VACCINE  09/07/2019    There are no preventive care reminders to display for this patient.  Lab Results  Component Value Date   TSH 1.560 02/11/2020   Lab Results  Component Value Date   WBC 6.9 02/11/2020   HGB 12.4 (L) 02/11/2020   HCT 38.0 02/11/2020   MCV 93 02/11/2020   PLT 235 02/11/2020   Lab Results  Component Value Date   NA 143 02/11/2020   K 4.9 02/11/2020   CO2 25 02/11/2020   GLUCOSE 103 (H) 02/11/2020   BUN 13 02/11/2020   CREATININE 1.23 02/11/2020   BILITOT 0.9 02/11/2020   ALKPHOS 39 (L) 02/11/2020   AST 20 02/11/2020   ALT 21 02/11/2020   PROT 7.0 02/11/2020   ALBUMIN 4.2 02/11/2020   CALCIUM 8.9 02/11/2020   ANIONGAP 6 07/21/2016   Lab  Results  Component Value Date   CHOL 151 02/11/2020   Lab Results  Component Value Date   HDL 45 02/11/2020   Lab Results  Component Value Date   LDLCALC 84 02/11/2020   Lab Results  Component Value Date   TRIG 122 02/11/2020   Lab Results  Component Value Date   CHOLHDL 4.6 12/26/2012   Lab Results  Component Value Date   HGBA1C 6.8 (H) 02/11/2020      Assessment & Plan:   Problem List Items Addressed This Visit      Endocrine   Type 2 diabetes mellitus treated without insulin ([place])    Lab Results  Component Value Date   HGBA1C 6.8 (H) 02/11/2020   -goal A1c <7.0; so he is at goal -continue current meds; glipizide-metformin 2.5/500 mg 2 tablets in AM and 1 in PM        Other   Insomnia, unspecified    -no issues today -continue trazodone      Anxiety    -no issues today -continue xanax 0.5 mg TID PRN -continue lexapro 10 mg daily         No orders of the defined types were placed in this encounter.   Follow-up: Return in about 3 months (around 05/23/2020) for Lab follow-up.   Date:  02/23/2020   Location of Patient: Home Location of Provider: Office Consent was obtain for visit to be over via telehealth. I verified that I am speaking with the correct person using two identifiers.  I connected with  Steven Stevens on 02/23/20 via telephone and verified that I am speaking with the correct person using two identifiers.   I discussed the limitations of evaluation and management by telemedicine. The patient expressed understanding and agreed to proceed.  Time spent: 9 minutes   [name], NP

## 2020-02-25 ENCOUNTER — Telehealth: Payer: Self-pay

## 2020-02-25 NOTE — Telephone Encounter (Signed)
Med refill Glipizide - Metformin  2.5-500 MG  [facility] in [place]

## 2020-02-26 ENCOUNTER — Other Ambulatory Visit: Payer: Self-pay

## 2020-02-26 DIAGNOSIS — E119 Type 2 diabetes mellitus without complications: Secondary | ICD-10-CM

## 2020-02-26 MED ORDER — GLIPIZIDE-METFORMIN HCL 2.5-500 MG PO TABS: ORAL_TABLET | ORAL | 1 refills | Status: DC

## 2020-02-26 NOTE — Telephone Encounter (Signed)
Rx refill

## 2020-03-09 ENCOUNTER — Telehealth: Payer: Self-pay

## 2020-03-09 ENCOUNTER — Other Ambulatory Visit: Payer: Self-pay | Admitting: Nurse Practitioner

## 2020-03-09 MED ORDER — HYDROCODONE-ACETAMINOPHEN 10-325 MG PO TABS: 1.0000 | ORAL_TABLET | Freq: Four times a day (QID) | ORAL | 0 refills | Status: AC | PRN

## 2020-03-09 NOTE — Telephone Encounter (Signed)
sent 

## 2020-03-09 NOTE — Telephone Encounter (Signed)
Pt is calling in requesting a refill of his Hydrocodone

## 2020-03-09 NOTE — Telephone Encounter (Signed)
Can you send this?

## 2020-03-12 ENCOUNTER — Other Ambulatory Visit: Payer: Self-pay | Admitting: *Deleted

## 2020-03-12 MED ORDER — LOSARTAN POTASSIUM 25 MG PO TABS: 25.0000 mg | ORAL_TABLET | Freq: Every day | ORAL | 0 refills | Status: DC

## 2020-03-23 ENCOUNTER — Other Ambulatory Visit: Payer: Self-pay

## 2020-03-23 ENCOUNTER — Telehealth: Payer: Self-pay

## 2020-03-23 DIAGNOSIS — G4700 Insomnia, unspecified: Secondary | ICD-10-CM

## 2020-03-23 MED ORDER — TRAZODONE HCL 50 MG PO TABS: 50.0000 mg | ORAL_TABLET | Freq: Every day | ORAL | 1 refills | Status: DC

## 2020-03-23 NOTE — Telephone Encounter (Signed)
Rx refilled.

## 2020-03-23 NOTE — Telephone Encounter (Signed)
Pt called need med refill:  traZODone (DESYREL) 50 MG tablet    Pharmacy: [facility]

## 2020-03-29 ENCOUNTER — Other Ambulatory Visit: Payer: Self-pay

## 2020-03-29 DIAGNOSIS — I10 Essential (primary) hypertension: Secondary | ICD-10-CM

## 2020-03-29 MED ORDER — LOSARTAN POTASSIUM 25 MG PO TABS: 25.0000 mg | ORAL_TABLET | Freq: Every day | ORAL | 0 refills | Status: DC

## 2020-04-01 ENCOUNTER — Telehealth: Payer: Self-pay

## 2020-04-01 NOTE — Telephone Encounter (Signed)
Patient needing refills on terazosin and escitalopram 90 day supply for both send to [facility]

## 2020-04-02 ENCOUNTER — Other Ambulatory Visit: Payer: Self-pay

## 2020-04-02 DIAGNOSIS — F419 Anxiety disorder, unspecified: Secondary | ICD-10-CM

## 2020-04-02 DIAGNOSIS — I10 Essential (primary) hypertension: Secondary | ICD-10-CM

## 2020-04-02 MED ORDER — ESCITALOPRAM OXALATE 10 MG PO TABS: 10.0000 mg | ORAL_TABLET | Freq: Every day | ORAL | 3 refills | Status: DC

## 2020-04-02 MED ORDER — TERAZOSIN HCL 5 MG PO CAPS: 5.0000 mg | ORAL_CAPSULE | Freq: Every day | ORAL | 1 refills | Status: DC

## 2020-04-02 NOTE — Telephone Encounter (Signed)
Refill sent to pharmacy.   

## 2020-04-08 ENCOUNTER — Other Ambulatory Visit: Payer: Self-pay | Admitting: Nurse Practitioner

## 2020-04-08 ENCOUNTER — Telehealth: Payer: Self-pay

## 2020-04-08 MED ORDER — HYDROCODONE-ACETAMINOPHEN 10-325 MG PO TABS: 1.0000 | ORAL_TABLET | Freq: Three times a day (TID) | ORAL | 0 refills | Status: DC | PRN

## 2020-04-08 NOTE — Telephone Encounter (Signed)
Please send refill 

## 2020-04-08 NOTE — Telephone Encounter (Signed)
sent 

## 2020-04-08 NOTE — Telephone Encounter (Signed)
Patient called need med refill  Hydrocodone 10-325 mg   Ph[facility]

## 2020-04-12 ENCOUNTER — Observation Stay (HOSPITAL_COMMUNITY)
Admission: EM | Admit: 2020-04-12 | Discharge: 2020-04-13 | Disposition: A | Discharge status: Home / Self Care | Payer: Medicare Other | Attending: Cardiology | Admitting: Cardiology

## 2020-04-12 ENCOUNTER — Telehealth: Payer: Self-pay | Admitting: Internal Medicine

## 2020-04-12 ENCOUNTER — Other Ambulatory Visit: Payer: Self-pay | Admitting: Nurse Practitioner

## 2020-04-12 ENCOUNTER — Encounter (HOSPITAL_COMMUNITY): Payer: Self-pay | Admitting: *Deleted

## 2020-04-12 ENCOUNTER — Encounter (HOSPITAL_COMMUNITY)
Admission: EM | Disposition: A | Discharge status: Home / Self Care | Payer: Self-pay | Source: Home / Self Care | Attending: Cardiology

## 2020-04-12 ENCOUNTER — Emergency Department (HOSPITAL_COMMUNITY): Payer: Medicare Other

## 2020-04-12 ENCOUNTER — Other Ambulatory Visit: Payer: Self-pay

## 2020-04-12 DIAGNOSIS — R079 Chest pain, unspecified: Secondary | ICD-10-CM | POA: Diagnosis present

## 2020-04-12 DIAGNOSIS — R748 Abnormal levels of other serum enzymes: Secondary | ICD-10-CM | POA: Diagnosis not present

## 2020-04-12 DIAGNOSIS — I10 Essential (primary) hypertension: Secondary | ICD-10-CM

## 2020-04-12 DIAGNOSIS — E119 Type 2 diabetes mellitus without complications: Secondary | ICD-10-CM | POA: Diagnosis not present

## 2020-04-12 DIAGNOSIS — Z87891 Personal history of nicotine dependence: Secondary | ICD-10-CM | POA: Diagnosis not present

## 2020-04-12 DIAGNOSIS — J811 Chronic pulmonary edema: Secondary | ICD-10-CM | POA: Diagnosis not present

## 2020-04-12 DIAGNOSIS — E785 Hyperlipidemia, unspecified: Secondary | ICD-10-CM | POA: Insufficient documentation

## 2020-04-12 DIAGNOSIS — F329 Major depressive disorder, single episode, unspecified: Secondary | ICD-10-CM | POA: Insufficient documentation

## 2020-04-12 DIAGNOSIS — F419 Anxiety disorder, unspecified: Secondary | ICD-10-CM | POA: Insufficient documentation

## 2020-04-12 DIAGNOSIS — I2510 Atherosclerotic heart disease of native coronary artery without angina pectoris: Secondary | ICD-10-CM | POA: Diagnosis not present

## 2020-04-12 DIAGNOSIS — Z7984 Long term (current) use of oral hypoglycemic drugs: Secondary | ICD-10-CM | POA: Diagnosis not present

## 2020-04-12 DIAGNOSIS — Z79899 Other long term (current) drug therapy: Secondary | ICD-10-CM | POA: Insufficient documentation

## 2020-04-12 DIAGNOSIS — I214 Non-ST elevation (NSTEMI) myocardial infarction: Principal | ICD-10-CM | POA: Diagnosis present

## 2020-04-12 DIAGNOSIS — Z791 Long term (current) use of non-steroidal anti-inflammatories (NSAID): Secondary | ICD-10-CM | POA: Insufficient documentation

## 2020-04-12 DIAGNOSIS — R001 Bradycardia, unspecified: Secondary | ICD-10-CM | POA: Insufficient documentation

## 2020-04-12 DIAGNOSIS — R9431 Abnormal electrocardiogram [ECG] [EKG]: Secondary | ICD-10-CM | POA: Diagnosis not present

## 2020-04-12 DIAGNOSIS — Z955 Presence of coronary angioplasty implant and graft: Secondary | ICD-10-CM

## 2020-04-12 DIAGNOSIS — R0602 Shortness of breath: Secondary | ICD-10-CM | POA: Diagnosis not present

## 2020-04-12 DIAGNOSIS — Z20822 Contact with and (suspected) exposure to covid-19: Secondary | ICD-10-CM | POA: Diagnosis not present

## 2020-04-12 DIAGNOSIS — G4733 Obstructive sleep apnea (adult) (pediatric): Secondary | ICD-10-CM | POA: Insufficient documentation

## 2020-04-12 DIAGNOSIS — I517 Cardiomegaly: Secondary | ICD-10-CM | POA: Diagnosis not present

## 2020-04-12 HISTORY — PX: CORONARY STENT INTERVENTION: CATH118234

## 2020-04-12 HISTORY — PX: INTRAVASCULAR IMAGING/OCT: CATH118326

## 2020-04-12 HISTORY — DX: Type 2 diabetes mellitus without complications: E11.9

## 2020-04-12 HISTORY — PX: LEFT HEART CATH AND CORONARY ANGIOGRAPHY: CATH118249

## 2020-04-12 LAB — LIPID PANEL
Cholesterol: 148 mg/dL (ref 0–200)
HDL: 40 mg/dL — ABNORMAL LOW (ref >40)
LDL Cholesterol: 85 mg/dL (ref 0–99)
Total CHOL/HDL Ratio: 3.7 RATIO
Triglycerides: 117 mg/dL (ref <150)
VLDL: 23 mg/dL (ref 0–40)

## 2020-04-12 LAB — HEMOGLOBIN A1C
Hgb A1c MFr Bld: 7 % — ABNORMAL HIGH (ref 4.8–5.6)
Mean Plasma Glucose: 154.2 mg/dL

## 2020-04-12 LAB — GLUCOSE, CAPILLARY: Glucose-Capillary: 104 mg/dL — ABNORMAL HIGH (ref 70–99)

## 2020-04-12 LAB — MRSA PCR SCREENING: MRSA by PCR: NEGATIVE

## 2020-04-12 LAB — BRAIN NATRIURETIC PEPTIDE: B Natriuretic Peptide: 45 pg/mL (ref 0.0–100.0)

## 2020-04-12 LAB — BASIC METABOLIC PANEL
Anion gap: 12 (ref 5–15)
BUN: 15 mg/dL (ref 8–23)
CO2: 24 mmol/L (ref 22–32)
Calcium: 9.1 mg/dL (ref 8.9–10.3)
Chloride: 101 mmol/L (ref 98–111)
Creatinine, Ser: 1.08 mg/dL (ref 0.61–1.24)
GFR, Estimated: >60 mL/min (ref >60)
Glucose, Bld: 125 mg/dL — ABNORMAL HIGH (ref 70–99)
Potassium: 4.1 mmol/L (ref 3.5–5.1)
Sodium: 137 mmol/L (ref 135–145)

## 2020-04-12 LAB — RESP PANEL BY RT-PCR (FLU A&B, COVID) ARPGX2
Influenza A by PCR: NEGATIVE
Influenza B by PCR: NEGATIVE
SARS Coronavirus 2 by RT PCR: NEGATIVE

## 2020-04-12 LAB — TROPONIN I (HIGH SENSITIVITY)
Troponin I (High Sensitivity): 321 ng/L (ref <18)
Troponin I (High Sensitivity): 390 ng/L (ref <18)
Troponin I (High Sensitivity): 1219 ng/L (ref <18)

## 2020-04-12 LAB — CBC
HCT: 40.3 % (ref 39.0–52.0)
Hemoglobin: 12.7 g/dL — ABNORMAL LOW (ref 13.0–17.0)
MCH: 30.5 pg (ref 26.0–34.0)
MCHC: 31.5 g/dL (ref 30.0–36.0)
MCV: 96.6 fL (ref 80.0–100.0)
Platelets: 236 10*3/uL (ref 150–400)
RBC: 4.17 MIL/uL — ABNORMAL LOW (ref 4.22–5.81)
RDW: 14.1 % (ref 11.5–15.5)
WBC: 9 10*3/uL (ref 4.0–10.5)
nRBC: 0 % (ref 0.0–0.2)

## 2020-04-12 LAB — TSH: TSH: 1.966 u[IU]/mL (ref 0.350–4.500)

## 2020-04-12 LAB — CBG MONITORING, ED: Glucose-Capillary: 107 mg/dL — ABNORMAL HIGH (ref 70–99)

## 2020-04-12 LAB — POCT ACTIVATED CLOTTING TIME: Activated Clotting Time: 321 seconds

## 2020-04-12 SURGERY — LEFT HEART CATH AND CORONARY ANGIOGRAPHY
Anesthesia: LOCAL

## 2020-04-12 MED ORDER — NITROGLYCERIN 0.4 MG SL SUBL
0.4000 mg | SUBLINGUAL_TABLET | SUBLINGUAL | Status: DC | PRN
  Administered 2020-04-12 (×2): 0.4 mg via SUBLINGUAL
  Filled 2020-04-12: qty 1

## 2020-04-12 MED ORDER — INSULIN ASPART 100 UNIT/ML ~~LOC~~ SOLN: 0.0000 [IU] | Freq: Every day | SUBCUTANEOUS | Status: DC

## 2020-04-12 MED ORDER — VERAPAMIL HCL 2.5 MG/ML IV SOLN
INTRAVENOUS | Status: AC
  Filled 2020-04-12: qty 2

## 2020-04-12 MED ORDER — CLOPIDOGREL BISULFATE 75 MG PO TABS
ORAL_TABLET | ORAL | Status: DC | PRN
Start: 2020-04-12 — End: 2020-04-12
  Administered 2020-04-12: 75 mg via ORAL

## 2020-04-12 MED ORDER — IOHEXOL 350 MG/ML SOLN
INTRAVENOUS | Status: DC | PRN
  Administered 2020-04-12: 120 mL

## 2020-04-12 MED ORDER — SODIUM CHLORIDE 0.9% FLUSH
3.0000 mL | Freq: Two times a day (BID) | INTRAVENOUS | Status: DC
  Administered 2020-04-12 – 2020-04-13 (×2): 3 mL via INTRAVENOUS

## 2020-04-12 MED ORDER — NITROGLYCERIN 1 MG/10 ML FOR IR/CATH LAB
INTRA_ARTERIAL | Status: AC
  Filled 2020-04-12: qty 10

## 2020-04-12 MED ORDER — ASPIRIN 81 MG PO CHEW
324.0000 mg | CHEWABLE_TABLET | Freq: Once | ORAL | Status: AC
  Administered 2020-04-12: 324 mg via ORAL
  Filled 2020-04-12: qty 4

## 2020-04-12 MED ORDER — ALPRAZOLAM 0.25 MG PO TABS
0.2500 mg | ORAL_TABLET | Freq: Two times a day (BID) | ORAL | Status: DC | PRN
  Administered 2020-04-12: 0.25 mg via ORAL
  Filled 2020-04-12: qty 1

## 2020-04-12 MED ORDER — MIDAZOLAM HCL 2 MG/2ML IJ SOLN
INTRAMUSCULAR | Status: DC | PRN
  Administered 2020-04-12: 1 mg via INTRAVENOUS

## 2020-04-12 MED ORDER — ZOLPIDEM TARTRATE 5 MG PO TABS
5.0000 mg | ORAL_TABLET | Freq: Every evening | ORAL | Status: DC | PRN
  Administered 2020-04-12: 5 mg via ORAL
  Filled 2020-04-12: qty 1

## 2020-04-12 MED ORDER — VERAPAMIL HCL 2.5 MG/ML IV SOLN
INTRAVENOUS | Status: DC | PRN
  Administered 2020-04-12: 10 mL via INTRA_ARTERIAL

## 2020-04-12 MED ORDER — LIDOCAINE HCL (PF) 1 % IJ SOLN
INTRAMUSCULAR | Status: DC | PRN
  Administered 2020-04-12: 2 mL

## 2020-04-12 MED ORDER — HEPARIN SODIUM (PORCINE) 1000 UNIT/ML IJ SOLN
INTRAMUSCULAR | Status: AC
  Filled 2020-04-12: qty 1

## 2020-04-12 MED ORDER — SODIUM CHLORIDE 0.9 % IV SOLN: 250.0000 mL | INTRAVENOUS | Status: DC | PRN

## 2020-04-12 MED ORDER — HEPARIN (PORCINE) IN NACL 1000-0.9 UT/500ML-% IV SOLN
INTRAVENOUS | Status: AC
  Filled 2020-04-12: qty 1000

## 2020-04-12 MED ORDER — NITROGLYCERIN 0.4 MG SL SUBL: 0.4000 mg | SUBLINGUAL_TABLET | SUBLINGUAL | Status: DC | PRN

## 2020-04-12 MED ORDER — NITROGLYCERIN 1 MG/10 ML FOR IR/CATH LAB
INTRA_ARTERIAL | Status: DC | PRN
  Administered 2020-04-12: 150 ug via INTRACORONARY

## 2020-04-12 MED ORDER — MIDAZOLAM HCL 2 MG/2ML IJ SOLN
INTRAMUSCULAR | Status: AC
  Filled 2020-04-12: qty 2

## 2020-04-12 MED ORDER — HYDRALAZINE HCL 20 MG/ML IJ SOLN: 10.0000 mg | INTRAMUSCULAR | Status: AC | PRN

## 2020-04-12 MED ORDER — SODIUM CHLORIDE 0.9 % IV SOLN: INTRAVENOUS | Status: DC

## 2020-04-12 MED ORDER — SODIUM CHLORIDE 0.9 % IV SOLN: INTRAVENOUS | Status: AC

## 2020-04-12 MED ORDER — SODIUM CHLORIDE 0.9% FLUSH: 3.0000 mL | INTRAVENOUS | Status: DC | PRN

## 2020-04-12 MED ORDER — HEPARIN (PORCINE) IN NACL 1000-0.9 UT/500ML-% IV SOLN
INTRAVENOUS | Status: DC | PRN
  Administered 2020-04-12 (×2): 500 mL

## 2020-04-12 MED ORDER — HEPARIN (PORCINE) 25000 UT/250ML-% IV SOLN
1300.0000 [IU]/h | INTRAVENOUS | Status: DC
  Administered 2020-04-12: 1300 [IU]/h via INTRAVENOUS
  Filled 2020-04-12: qty 250

## 2020-04-12 MED ORDER — ONDANSETRON HCL 4 MG/2ML IJ SOLN: 4.0000 mg | Freq: Four times a day (QID) | INTRAMUSCULAR | Status: DC | PRN

## 2020-04-12 MED ORDER — ASPIRIN 81 MG PO CHEW
81.0000 mg | CHEWABLE_TABLET | Freq: Every day | ORAL | Status: DC
  Administered 2020-04-13: 81 mg via ORAL
  Filled 2020-04-12: qty 1

## 2020-04-12 MED ORDER — LIDOCAINE HCL (PF) 1 % IJ SOLN
INTRAMUSCULAR | Status: AC
  Filled 2020-04-12: qty 30

## 2020-04-12 MED ORDER — SODIUM CHLORIDE 0.9% FLUSH
3.0000 mL | Freq: Two times a day (BID) | INTRAVENOUS | Status: DC
Start: 2020-04-12 — End: 2020-04-13
  Administered 2020-04-13: 3 mL via INTRAVENOUS

## 2020-04-12 MED ORDER — INSULIN ASPART 100 UNIT/ML ~~LOC~~ SOLN
0.0000 [IU] | Freq: Three times a day (TID) | SUBCUTANEOUS | Status: DC
  Administered 2020-04-13: 2 [IU] via SUBCUTANEOUS
  Administered 2020-04-13: 3 [IU] via SUBCUTANEOUS

## 2020-04-12 MED ORDER — ACETAMINOPHEN 325 MG PO TABS: 650.0000 mg | ORAL_TABLET | ORAL | Status: DC | PRN

## 2020-04-12 MED ORDER — LABETALOL HCL 5 MG/ML IV SOLN: 10.0000 mg | INTRAVENOUS | Status: AC | PRN

## 2020-04-12 MED ORDER — CLOPIDOGREL BISULFATE 75 MG PO TABS
75.0000 mg | ORAL_TABLET | Freq: Every day | ORAL | Status: DC
  Administered 2020-04-13: 75 mg via ORAL
  Filled 2020-04-12: qty 1

## 2020-04-12 MED ORDER — HEPARIN SODIUM (PORCINE) 1000 UNIT/ML IJ SOLN
INTRAMUSCULAR | Status: DC | PRN
  Administered 2020-04-12 (×2): 7000 [IU] via INTRAVENOUS

## 2020-04-12 MED ORDER — HEPARIN BOLUS VIA INFUSION
4000.0000 [IU] | Freq: Once | INTRAVENOUS | Status: AC
  Administered 2020-04-12: 4000 [IU] via INTRAVENOUS

## 2020-04-12 MED ORDER — CLOPIDOGREL BISULFATE 75 MG PO TABS
ORAL_TABLET | ORAL | Status: AC
  Filled 2020-04-12: qty 1

## 2020-04-12 MED ORDER — FENTANYL CITRATE (PF) 100 MCG/2ML IJ SOLN
INTRAMUSCULAR | Status: AC
  Filled 2020-04-12: qty 2

## 2020-04-12 MED ORDER — FENTANYL CITRATE (PF) 100 MCG/2ML IJ SOLN
INTRAMUSCULAR | Status: DC | PRN
  Administered 2020-04-12: 25 ug via INTRAVENOUS

## 2020-04-12 SURGICAL SUPPLY — 17 items
BALLN SAPPHIRE 3.0X15 (BALLOONS) ×2
BALLOON SAPPHIRE 3.0X15 (BALLOONS) IMPLANT
CATH 5FR JL3.5 JR4 ANG PIG MP (CATHETERS) ×1 IMPLANT
CATH DRAGONFLY OPSTAR (CATHETERS) ×1 IMPLANT
CATH LAUNCHER 6FR EBU3.5 (CATHETERS) ×1 IMPLANT
DEVICE RAD COMP TR BAND LRG (VASCULAR PRODUCTS) ×1 IMPLANT
GLIDESHEATH SLEND SS 6F .021 (SHEATH) ×1 IMPLANT
GUIDEWIRE ANGLED .035X150CM (WIRE) ×1 IMPLANT
GUIDEWIRE INQWIRE 1.5J.035X260 (WIRE) IMPLANT
INQWIRE 1.5J .035X260CM (WIRE) ×2
KIT ENCORE 26 ADVANTAGE (KITS) ×1 IMPLANT
KIT HEART LEFT (KITS) ×2 IMPLANT
PACK CARDIAC CATHETERIZATION (CUSTOM PROCEDURE TRAY) ×2 IMPLANT
STENT RESOLUTE ONYX 4.0X22 (Permanent Stent) ×1 IMPLANT
TRANSDUCER W/STOPCOCK (MISCELLANEOUS) ×2 IMPLANT
TUBING CIL FLEX 10 FLL-RA (TUBING) ×2 IMPLANT
WIRE COUGAR XT STRL 190CM (WIRE) ×1 IMPLANT

## 2020-04-12 NOTE — Progress Notes (Signed)
Admission from the cath. Lab by bed awake and alert. TR band to right wrist intact. Elevated with pillow. Instructed not to move right arm. Continue to monitor.

## 2020-04-12 NOTE — Telephone Encounter (Signed)
Spoke with pt who states that he has been having SOB for the last 2 weeks. Pt reports having chest pain on yesterday on and off. He does report nausea at times. Pt encouraged to be seen in the ER for eval. Pt states that he took 2 nitro with little relief. Pt rates pain when at rest 2/10 and when moving 9/10.

## 2020-04-12 NOTE — Progress Notes (Signed)
ANTICOAGULATION CONSULT NOTE - Initial Consult  Pharmacy Consult for Heparin Indication: chest pain/ACS  No Known Allergies  Patient Measurements: Height: 5' 8.5" (174 cm) Weight: (!) 141.1 kg (311 lb) IBW/kg (Calculated) : 69.55 HEPARIN DW (KG): 103.2  Vital Signs: Temp: 97.9 F (36.6 C) (03/07 1046) Temp Source: Oral (03/07 1046) BP: 124/76 (03/07 1133) Pulse Rate: 71 (03/07 1133)  Labs: Recent Labs    04/12/20 1112  CREATININE 1.08  TROPONINIHS 321*    Estimated Creatinine Clearance: 83.3 mL/min (by C-G formula based on SCr of 1.08 mg/dL).   Medical History: Past Medical History:  Diagnosis Date  . Anxiety    Phreesia 01/11/2020  . Chronic lower back pain   . Coronary artery disease   . Depression    "lost wife 01/14/2015"  . Depression    Phreesia 01/11/2020  . Diabetes mellitus without complication ([place])    Phreesia 01/11/2020  . High cholesterol   . Hyperlipidemia    Phreesia 01/11/2020  . Hypertension   . Myocardial infarct ([place]) 01/27/2004  . Myocardial infarction ([place])    Phreesia 01/11/2020  . Type II diabetes mellitus ([facility])     Medications:  See med rec  Assessment: Patient presented to ED with chest pain. Troponin is elevated. Reviewed home meds and patient not on oral anticoagulant. Pharmacy asked to start heparin.  Goal of Therapy:  Heparin level 0.3-0.7 units/ml Monitor platelets by anticoagulation protocol: Yes   Plan:  Give 4000 units bolus x 1 Start heparin infusion at 1300 units/hr Check anti-Xa level in ~8 hours and daily while on heparin Continue to monitor H&H and platelets  Isac Sarna, BS [place], BCPS Clinical Pharmacist Pager [phone] 04/12/2020,12:18 PM

## 2020-04-12 NOTE — Progress Notes (Addendum)
Critical lab value called for Troponin, paged [facility] to advised,  Notified RN of above as well. Spoke to [place], [place] advised of elevated troponin, Pt is not c/o chest pain or discomfort. Continue to monitor Pt.

## 2020-04-12 NOTE — Consult Note (Addendum)
History and Physical:   Patient ID: Steven Stevens MRN: [id number]; DOB: 1945-04-03  Admit date: 04/12/2020 Date of Consult: 04/12/2020  PCP:  [name], NP   [facility]  Cardiologist:  [name], MD 03/26/2017   Patient Profile:   Steven Stevens is a 75 y.o. male with a history of coronary artery disease status post RCA PCI in 2005 and 2017, DM2, HTN, HLD, OSA not on CPAP,and  bradycardia, who is being seen today for the evaluation of recurring chest pain and elevated troponin at the request of Dr Langston Masker.  History of Present Illness:   Steven Stevens called the office complaining of chest pain >> sent to the ER. Troponin was elevated, cards asked to see.   Steven Stevens has been having increased DOE and fatigue for over a week. However, no CP till last pm.  Last pm, he had onset of mid chest and upper abdomen pain, 7/10, like an elephant on his chest. He took 2 SL NTG, admits they were old, with no effect.   He had to sit up all night. Trying to lie down made the chest pain worse. If he sat up and sat still, the CP would decrease to a 2/10.   He had the pain all night long.  Finally, he told his daughters and they made him go to the ER. He was given SL NTG and ASA. The pain has resolved. He is currently pain-free.   Past Medical History:  Diagnosis Date  . Anxiety   . Chronic lower back pain   . Coronary artery disease    DES to RCA 2005, DES to RCA 2017, DES x2 to CTO LAD 2017  . Depression   . Hyperlipidemia   . Hypertension   . Myocardial infarct ([place]) 01/27/2004  . Type 2 diabetes mellitus ([place])   . Type II diabetes mellitus ([place])     Past Surgical History:  Procedure Laterality Date  . BACK SURGERY    . CARDIAC CATHETERIZATION N/A 03/30/2015   Procedure: Left Heart Cath and Coronary Angiography;  Surgeon: Jettie Booze, MD;  Location: [place] [facility];  Service: Cardiovascular;  Laterality: N/A;  . CARDIAC CATHETERIZATION   03/30/2015   Procedure: Coronary Stent Intervention;  Surgeon: Jettie Booze, MD;  Location: [place] [facility];  Service: Cardiovascular;;  . CARDIAC CATHETERIZATION N/A 04/14/2015   Procedure: Coronary/Bypass Graft CTO Intervention;  Surgeon: Jettie Booze, MD;  Location: [place] [facility];  Service: Cardiovascular;  Laterality: N/A;  . CARDIAC CATHETERIZATION  04/14/2015   Procedure: Left Heart Cath and Coronary Angiography;  Surgeon: Jettie Booze, MD;  Location: [place] [facility];  Service: Cardiovascular;;  . CATARACT EXTRACTION W/PHACO Right 12/03/2012   Procedure: CATARACT EXTRACTION RIGHT EYE (WITH PHACO) AND INTRAOCULAR LENS PLACEMENT  CDE=2.41;  Surgeon: Elta Guadeloupe [name], MD;  Location: [facility];  Service: Ophthalmology;  Laterality: Right;  . CATARACT EXTRACTION W/PHACO Left 12/17/2012   Procedure: CATARACT EXTRACTION PHACO AND INTRAOCULAR LENS PLACEMENT (IOC);  Surgeon: Elta Guadeloupe [name], MD;  Location: [facility];  Service: Ophthalmology;  Laterality: Left;  CDE:7.82  . COLON SURGERY N/A    Phreesia 01/11/2020  . COLONOSCOPY  2011   Dr. Gala Romney: diverticulosis, tubular adenomas  . COLONOSCOPY WITH PROPOFOL N/A 07/24/2016   Rourk: 3 tubular adenomas removed.  Diverticulosis.  3-year surveillance colonoscopy recommended.  . COLONOSCOPY WITH PROPOFOL N/A 12/15/2019   Procedure: COLONOSCOPY WITH PROPOFOL;  Surgeon: Manus Rudd  M, MD;  [facility];  Service: Endoscopy;  Laterality: N/A;  8:30am  . CORONARY ANGIOPLASTY WITH STENT PLACEMENT  2005  . CORONARY STENT PLACEMENT  03/30/2015   RCA    DES  . EYE SURGERY N/A    Phreesia 01/11/2020  . HERNIA REPAIR    . KNEE ARTHROSCOPY Left 2012  . [place]   "herniated disc"  . POLYPECTOMY  07/24/2016   Procedure: POLYPECTOMY;  Surgeon: Daneil Dolin, MD;  [facility];  Service: Endoscopy;;  ascending colon polyp, splenic flexure polyps times 2  . POLYPECTOMY  12/15/2019   Procedure:  POLYPECTOMY;  Surgeon: Daneil Dolin, MD;  [facility];  Service: Endoscopy;;  . UMBILICAL HERNIA REPAIR  1990s  . YAG LASER APPLICATION Right 16/11/9602   Procedure: YAG LASER APPLICATION;  Surgeon: Elta Guadeloupe [name], MD;  Location: [facility];  Service: Ophthalmology;  Laterality: Right;     Home Medications:  Prior to Admission medications   Medication Sig Start Date End Date Taking? Authorizing Provider  acetaminophen (TYLENOL) 500 MG tablet Take 250 mg by mouth daily.    [provider]  ALPRAZolam Duanne Moron) 0.5 MG tablet Take 1 tablet (0.5 mg total) by mouth in the morning, at noon, and at bedtime. 02/09/20   [name], NP  aspirin EC 81 MG tablet Take 81 mg by mouth daily.    [provider]  atorvastatin (LIPITOR) 40 MG tablet TAKE 1 TABLET BY MOUTH DAILY Patient taking differently: Take 40 mg by mouth daily in the afternoon. 06/20/16   Lendon Colonel, NP  clopidogrel (PLAVIX) 75 MG tablet Take 1 tablet (75 mg total) by mouth daily. Patient taking differently: Take 75 mg by mouth daily in the afternoon. 04/15/15   Lyda Jester M, PA-C  escitalopram (LEXAPRO) 10 MG tablet Take 1 tablet (10 mg total) by mouth daily in the afternoon. 04/02/20   [name], NP  glipiZIDE-metformin (METAGLIP) 2.5-500 MG tablet TAKE 2 TABLETS BY MOUTH IN THE MORNING AND 1 TABLET AT NIGHT 02/26/20   [name], NP  HYDROcodone-acetaminophen [facility]) 10-325 MG tablet Take 1 tablet by mouth every 8 (eight) hours as needed for moderate pain. 04/08/20 05/08/20  [name], NP  hydrocortisone (ANUSOL-HC) 2.5 % rectal cream Place 1 application rectally 4 (four) times daily as needed (hemorrhoids/discomfort).  07/05/19   [provider]  LINZESS 145 MCG CAPS capsule Take 145 mcg by mouth daily as needed (constipation.).  06/05/19   [provider]  losartan (COZAAR) 25 MG tablet Take 1 tablet (25 mg total) by mouth daily. 03/29/20   [name], NP  metoprolol  tartrate (LOPRESSOR) 25 MG tablet Take 12.5 mg by mouth daily.    [provider]  Misc Natural Products (NEURIVA PO) Take 1 tablet by mouth daily in the afternoon.    [provider]  nitroGLYCERIN (NITROSTAT) 0.4 MG SL tablet Place 1 tablet (0.4 mg total) under the tongue every 5 (five) minutes as needed for chest pain. Patient taking differently: Place 0.4 mg under the tongue every 5 (five) minutes x 3 doses as needed for chest pain. 04/15/15   Lyda Jester M, PA-C  terazosin (HYTRIN) 5 MG capsule Take 1 capsule (5 mg total) by mouth daily in the afternoon. 04/02/20   [name], NP  traZODone (DESYREL) 50 MG tablet Take 1 tablet (50 mg total) by mouth at bedtime. 03/23/20  [name], NP    Inpatient Medications: Scheduled Meds:  Continuous Infusions: . sodium chloride    . heparin 1,300 Units/hr (04/12/20 1248)   PRN Meds: nitroGLYCERIN   Allergies:   No Known Allergies  Social History:   Social History   Tobacco Use  . Smoking status: Former Smoker    Packs/day: 0.10    Years: 10.00    Pack years: 1.00    Types: Cigarettes    Quit date: 01/31/2004    Years since quitting: 16.2  . Smokeless tobacco: Never Used  Substance Use Topics  . Alcohol use: No    Alcohol/week: 0.0 standard drinks    Family History:    Family History  Problem Relation Age of Onset  . Colon cancer Mother        deceased age 50, diagnosed with CRC in 21s.    FAMILY STATUS: He indicated that his mother is deceased. He indicated that his father is deceased.   ROS:  Please see the history of present illness. He has been constipated. All other ROS reviewed and negative.     Physical Exam/Data:   Vitals:   04/12/20 1150 04/12/20 1200 04/12/20 1210 04/12/20 1220  BP: 114/66 99/67 104/65   Pulse: 72 69 64   Resp: 18 17 17    Temp:      TempSrc:      SpO2: 96% 96% 96%   Weight:  (!) 141.1 kg  (!) 137 kg  Height:  5' 8.5" (1.74 m)  5' 8.5" (1.74 m)   No  intake or output data in the 24 hours ending 04/12/20 1310 Last 3 Weights 04/12/2020 04/12/2020 01/12/2020  Weight (lbs) 302 lb 311 lb 311 lb  Weight (kg) 136.986 kg 141.069 kg 141.069 kg     Body mass index is 45.25 kg/m.  General:  Well nourished, well developed, in no acute distress HEENT: normal Lymph: no adenopathy Neck: no JVD seen difficult to assess 2nd body habitus Endocrine:  No thryomegaly Vascular: No carotid bruits; FA pulses 2+ bilaterally without bruits  Cardiac:  normal S1, S2; RRR; no murmur  Lungs:  clear to auscultation bilaterally, no wheezing, rhonchi or rales  Abd: soft, nontender, no hepatomegaly  Ext: no edema Musculoskeletal:  No deformities, BUE and BLE strength normal and equal Skin: warm and dry  Neuro:  CNs 2-12 intact, no focal abnormalities noted Psych:  Normal affect   EKG:  The EKG was personally reviewed and demonstrates:  SR, lateral T wave changes, non-specific Telemetry:  Telemetry was personally reviewed and demonstrates:  SR  Relevant CV Studies:  CARDIAC CATH: 04/14/2015  Patent stent in the RCA.  Mid LAD to Dist LAD lesion, 100% stenosed., chronic total occlusion. Post intervention with overlapping Synergy drug-eluting stents, there is a 0% residual stenosis.  Severely elevated LVEDP, 36 mm Hg.   Continue dual antiplatelet therapy for at least a year. Likely, he will need Plavix beyond the year.  Elevated LVEDP. Depending on renal function tomorrow, would consider giving a dose of IV Lasix. Possible discharge tomorrow depending on her and stealing. He will need aggressive treatment for his diastolic heart failure.  Laboratory Data:  High Sensitivity Troponin:   Recent Labs  Lab 04/12/20 1112  TROPONINIHS 321*     Chemistry Recent Labs  Lab 04/12/20 1112  NA 137  K 4.1  CL 101  CO2 24  GLUCOSE 125*  BUN 15  CREATININE 1.08  CALCIUM 9.1  GFRNONAA >60  ANIONGAP 12  Lab Results  Component Value Date   CHOL 151  02/11/2020   HDL 45 02/11/2020   LDLCALC 84 02/11/2020   TRIG 122 02/11/2020   CHOLHDL 4.6 12/26/2012   Lab Results  Component Value Date   TSH 1.560 02/11/2020   Lab Results  Component Value Date   HGBA1C 6.8 (H) 02/11/2020    Radiology/Studies:  [facility] Chest Port 1 View  Result Date: 04/12/2020 CLINICAL DATA:  Pending COVID test.  Shortness of breath. EXAM: PORTABLE CHEST 1 VIEW COMPARISON:  March 25, 2015. FINDINGS: Mild enlargement the cardiac silhouette, likely accentuated by portable AP technique. Mild central pulmonary vascular congestion. Similar chronic mild interstitial prominence without new consolidation. No visible pleural effusions or pneumothorax. No acute osseous abnormality. IMPRESSION: 1. Mild cardiomegaly and central pulmonary vascular congestion without overt pulmonary edema. 2. Similar chronic mild interstitial prominence without new consolidation. Electronically Signed   By: Margaretha Sheffield MD   On: 04/12/2020 12:00     Assessment and Plan:   1. Unstable angina, NSTEMI by enzymes - repeat trop pending - ECG has some changes, CP responded to nitro and hx CAD - Dr Domenic Polite discussed cath w/ pt and family, they agree to proceed. - arrange tx to [facility], screen for CRFs and their control   Risk Assessment/Risk Scores:     TIMI Risk Score for Unstable Angina or Non-ST Elevation MI:   The patient's TIMI risk score is 7, which indicates a 41% risk of all cause mortality, new or recurrent myocardial infarction or need for urgent revascularization in the next 14 days.    For questions or updates, please contact [place] Please consult [email or web address] for contact info under    Signed, Rosaria Ferries, PA-C 04/12/2020 1:10 PM    Attending note:  Patient seen and examined.  I reviewed his records and updated the chart, case discussed with Ms. Ahmed Prima PA-C and also family members in the room and on the phone.  Steven Stevens presents with at least a 1 week history  of worsening fatigue and shortness of breath with activity culminating in chest pain at rest consistent with unstable angina.  He was referred to the ER where high-sensitivity troponin I level is elevated at 321.  ECG shows high lateral T wave inversions and nonspecific ST changes, progressed compared to tracing from November 2021.  He has a known history of CAD status post DES to the RCA in 2005, DES to the RCA in 2017, and DES x2 for CTO LAD in 2017.  He was treated with nitroglycerin and started on heparin, chest pain-free on our evaluation and hemodynamically stable.  On examination he is in no acute distress.  Afebrile, heart rate in the 60s to 70s in sinus rhythm by telemetry which I personally reviewed.  Systolic blood pressure 56-314.  Lungs are clear without lipid breathing.  Cardiac exam reveals RRR without gallop.  Pertinent lab work includes potassium 4.1, BUN 15, creatinine 1.08, high-sensitivity troponin I 321, SARS coronavirus 2 test pending, CBC pending.  Chest x-ray reports mild cardiomegaly with central pulmonary vascular congestion.  Patient presents with unstable angina, new high lateral T wave inversions by ECG with nonspecific ST changes, high-sensitivity troponin I of 321 suggesting evolving ACS.  He has a history of CAD as discussed above, reports compliance with his medications including aspirin and Plavix.  Situation discussed with patient and family members with plan to transfer him to [facility] in anticipation of diagnostic cardiac catheterization with eye toward revascularization.  Considering risk/benefit profile, he is in agreement to proceed.  He will be transferred today for procedure.  Satira Sark, M.D., F.A.C.C.

## 2020-04-12 NOTE — ED Notes (Signed)
Date and time results received: 04/12/20 1325   Test: trop Critical Value: 390  Name of Provider Notified: trifan

## 2020-04-12 NOTE — Progress Notes (Signed)
Total 13 ml of air deflated from TR Band, no bleeding noted.

## 2020-04-12 NOTE — H&P (View-Only) (Signed)
History and Physical:   Patient ID: Steven Stevens MRN: [id number]; DOB: 02-19-1945  Admit date: 04/12/2020 Date of Consult: 04/12/2020  PCP:  [name], NP   [facility]  Cardiologist:  [name], MD 03/26/2017   Patient Profile:   Steven Stevens is a 75 y.o. male with a history of coronary artery disease status post RCA PCI in 2005 and 2017, DM2, HTN, HLD, OSA not on CPAP,and  bradycardia, who is being seen today for the evaluation of recurring chest pain and elevated troponin at the request of Dr Langston Masker.  History of Present Illness:   Steven Stevens called the office complaining of chest pain >> sent to the ER. Troponin was elevated, cards asked to see.   Steven Stevens has been having increased DOE and fatigue for over a week. However, no CP till last pm.  Last pm, he had onset of mid chest and upper abdomen pain, 7/10, like an elephant on his chest. He took 2 SL NTG, admits they were old, with no effect.   He had to sit up all night. Trying to lie down made the chest pain worse. If he sat up and sat still, the CP would decrease to a 2/10.   He had the pain all night long.  Finally, he told his daughters and they made him go to the ER. He was given SL NTG and ASA. The pain has resolved. He is currently pain-free.   Past Medical History:  Diagnosis Date  . Anxiety   . Chronic lower back pain   . Coronary artery disease    DES to RCA 2005, DES to RCA 2017, DES x2 to CTO LAD 2017  . Depression   . Hyperlipidemia   . Hypertension   . Myocardial infarct ([place]) 01/27/2004  . Type 2 diabetes mellitus ([place])   . Type II diabetes mellitus ([place])     Past Surgical History:  Procedure Laterality Date  . BACK SURGERY    . CARDIAC CATHETERIZATION N/A 03/30/2015   Procedure: Left Heart Cath and Coronary Angiography;  Surgeon: Jettie Booze, MD;  Location: [place] [facility];  Service: Cardiovascular;  Laterality: N/A;  . CARDIAC CATHETERIZATION   03/30/2015   Procedure: Coronary Stent Intervention;  Surgeon: Jettie Booze, MD;  Location: [place] [facility];  Service: Cardiovascular;;  . CARDIAC CATHETERIZATION N/A 04/14/2015   Procedure: Coronary/Bypass Graft CTO Intervention;  Surgeon: Jettie Booze, MD;  Location: [place] [facility];  Service: Cardiovascular;  Laterality: N/A;  . CARDIAC CATHETERIZATION  04/14/2015   Procedure: Left Heart Cath and Coronary Angiography;  Surgeon: Jettie Booze, MD;  Location: [place] [facility];  Service: Cardiovascular;;  . CATARACT EXTRACTION W/PHACO Right 12/03/2012   Procedure: CATARACT EXTRACTION RIGHT EYE (WITH PHACO) AND INTRAOCULAR LENS PLACEMENT  CDE=2.41;  Surgeon: Elta Guadeloupe [name], MD;  Location: [facility];  Service: Ophthalmology;  Laterality: Right;  . CATARACT EXTRACTION W/PHACO Left 12/17/2012   Procedure: CATARACT EXTRACTION PHACO AND INTRAOCULAR LENS PLACEMENT (IOC);  Surgeon: Elta Guadeloupe [name], MD;  Location: [facility];  Service: Ophthalmology;  Laterality: Left;  CDE:7.82  . COLON SURGERY N/A    Phreesia 01/11/2020  . COLONOSCOPY  2011   Dr. Gala Romney: diverticulosis, tubular adenomas  . COLONOSCOPY WITH PROPOFOL N/A 07/24/2016   R[place]k: 3 tubular adenomas removed.  Diverticulosis.  3-year surveillance colonoscopy recommended.  . COLONOSCOPY WITH PROPOFOL N/A 12/15/2019   Procedure: COLONOSCOPY WITH PROPOFOL;  Surgeon: Manus Rudd  M, MD;  [facility];  Service: Endoscopy;  Laterality: N/A;  8:30am  . CORONARY ANGIOPLASTY WITH STENT PLACEMENT  2005  . CORONARY STENT PLACEMENT  03/30/2015   RCA    DES  . EYE SURGERY N/A    Phreesia 01/11/2020  . HERNIA REPAIR    . KNEE ARTHROSCOPY Left 2012  . [place]   "herniated disc"  . POLYPECTOMY  07/24/2016   Procedure: POLYPECTOMY;  Surgeon: Daneil Dolin, MD;  [facility];  Service: Endoscopy;;  ascending colon polyp, splenic flexure polyps times 2  . POLYPECTOMY  12/15/2019   Procedure:  POLYPECTOMY;  Surgeon: Daneil Dolin, MD;  [facility];  Service: Endoscopy;;  . UMBILICAL HERNIA REPAIR  1990s  . YAG LASER APPLICATION Right 16/96/7893   Procedure: YAG LASER APPLICATION;  Surgeon: Elta Guadeloupe [name], MD;  Location: [facility];  Service: Ophthalmology;  Laterality: Right;     Home Medications:  Prior to Admission medications   Medication Sig Start Date End Date Taking? Authorizing Provider  acetaminophen (TYLENOL) 500 MG tablet Take 250 mg by mouth daily.    [provider]  ALPRAZolam Duanne Moron) 0.5 MG tablet Take 1 tablet (0.5 mg total) by mouth in the morning, at noon, and at bedtime. 02/09/20   [name], NP  aspirin EC 81 MG tablet Take 81 mg by mouth daily.    [provider]  atorvastatin (LIPITOR) 40 MG tablet TAKE 1 TABLET BY MOUTH DAILY Patient taking differently: Take 40 mg by mouth daily in the afternoon. 06/20/16   Lendon Colonel, NP  clopidogrel (PLAVIX) 75 MG tablet Take 1 tablet (75 mg total) by mouth daily. Patient taking differently: Take 75 mg by mouth daily in the afternoon. 04/15/15   Lyda Jester M, PA-C  escitalopram (LEXAPRO) 10 MG tablet Take 1 tablet (10 mg total) by mouth daily in the afternoon. 04/02/20   [name], NP  glipiZIDE-metformin (METAGLIP) 2.5-500 MG tablet TAKE 2 TABLETS BY MOUTH IN THE MORNING AND 1 TABLET AT NIGHT 02/26/20   [name], NP  HYDROcodone-acetaminophen [facility]) 10-325 MG tablet Take 1 tablet by mouth every 8 (eight) hours as needed for moderate pain. 04/08/20 05/08/20  [name], NP  hydrocortisone (ANUSOL-HC) 2.5 % rectal cream Place 1 application rectally 4 (four) times daily as needed (hemorrhoids/discomfort).  07/05/19   [provider]  LINZESS 145 MCG CAPS capsule Take 145 mcg by mouth daily as needed (constipation.).  06/05/19   [provider]  losartan (COZAAR) 25 MG tablet Take 1 tablet (25 mg total) by mouth daily. 03/29/20   [name], NP  metoprolol  tartrate (LOPRESSOR) 25 MG tablet Take 12.5 mg by mouth daily.    [provider]  Misc Natural Products (NEURIVA PO) Take 1 tablet by mouth daily in the afternoon.    [provider]  nitroGLYCERIN (NITROSTAT) 0.4 MG SL tablet Place 1 tablet (0.4 mg total) under the tongue every 5 (five) minutes as needed for chest pain. Patient taking differently: Place 0.4 mg under the tongue every 5 (five) minutes x 3 doses as needed for chest pain. 04/15/15   Lyda Jester M, PA-C  terazosin (HYTRIN) 5 MG capsule Take 1 capsule (5 mg total) by mouth daily in the afternoon. 04/02/20   [name], NP  traZODone (DESYREL) 50 MG tablet Take 1 tablet (50 mg total) by mouth at bedtime. 03/23/20  [name], NP    Inpatient Medications: Scheduled Meds:  Continuous Infusions: . sodium chloride    . heparin 1,300 Units/hr (04/12/20 1248)   PRN Meds: nitroGLYCERIN   Allergies:   No Known Allergies  Social History:   Social History   Tobacco Use  . Smoking status: Former Smoker    Packs/day: 0.10    Years: 10.00    Pack years: 1.00    Types: Cigarettes    Quit date: 01/31/2004    Years since quitting: 16.2  . Smokeless tobacco: Never Used  Substance Use Topics  . Alcohol use: No    Alcohol/week: 0.0 standard drinks    Family History:    Family History  Problem Relation Age of Onset  . Colon cancer Mother        deceased age 5, diagnosed with CRC in 72s.    FAMILY STATUS: He indicated that his mother is deceased. He indicated that his father is deceased.   ROS:  Please see the history of present illness. He has been constipated. All other ROS reviewed and negative.     Physical Exam/Data:   Vitals:   04/12/20 1150 04/12/20 1200 04/12/20 1210 04/12/20 1220  BP: 114/66 99/67 104/65   Pulse: 72 69 64   Resp: 18 17 17    Temp:      TempSrc:      SpO2: 96% 96% 96%   Weight:  (!) 141.1 kg  (!) 137 kg  Height:  5' 8.5" (1.74 m)  5' 8.5" (1.74 m)   No  intake or output data in the 24 hours ending 04/12/20 1310 Last 3 Weights 04/12/2020 04/12/2020 01/12/2020  Weight (lbs) 302 lb 311 lb 311 lb  Weight (kg) 136.986 kg 141.069 kg 141.069 kg     Body mass index is 45.25 kg/m.  General:  Well nourished, well developed, in no acute distress HEENT: normal Lymph: no adenopathy Neck: no JVD seen difficult to assess 2nd body habitus Endocrine:  No thryomegaly Vascular: No carotid bruits; FA pulses 2+ bilaterally without bruits  Cardiac:  normal S1, S2; RRR; no murmur  Lungs:  clear to auscultation bilaterally, no wheezing, rhonchi or rales  Abd: soft, nontender, no hepatomegaly  Ext: no edema Musculoskeletal:  No deformities, BUE and BLE strength normal and equal Skin: warm and dry  Neuro:  CNs 2-12 intact, no focal abnormalities noted Psych:  Normal affect   EKG:  The EKG was personally reviewed and demonstrates:  SR, lateral T wave changes, non-specific Telemetry:  Telemetry was personally reviewed and demonstrates:  SR  Relevant CV Studies:  CARDIAC CATH: 04/14/2015  Patent stent in the RCA.  Mid LAD to Dist LAD lesion, 100% stenosed., chronic total occlusion. Post intervention with overlapping Synergy drug-eluting stents, there is a 0% residual stenosis.  Severely elevated LVEDP, 36 mm Hg.   Continue dual antiplatelet therapy for at least a year. Likely, he will need Plavix beyond the year.  Elevated LVEDP. Depending on renal function tomorrow, would consider giving a dose of IV Lasix. Possible discharge tomorrow depending on her and stealing. He will need aggressive treatment for his diastolic heart failure.  Laboratory Data:  High Sensitivity Troponin:   Recent Labs  Lab 04/12/20 1112  TROPONINIHS 321*     Chemistry Recent Labs  Lab 04/12/20 1112  NA 137  K 4.1  CL 101  CO2 24  GLUCOSE 125*  BUN 15  CREATININE 1.08  CALCIUM 9.1  GFRNONAA >60  ANIONGAP 12  Lab Results  Component Value Date   CHOL 151  02/11/2020   HDL 45 02/11/2020   LDLCALC 84 02/11/2020   TRIG 122 02/11/2020   CHOLHDL 4.6 12/26/2012   Lab Results  Component Value Date   TSH 1.560 02/11/2020   Lab Results  Component Value Date   HGBA1C 6.8 (H) 02/11/2020    Radiology/Studies:  [facility] Chest Port 1 View  Result Date: 04/12/2020 CLINICAL DATA:  Pending COVID test.  Shortness of breath. EXAM: PORTABLE CHEST 1 VIEW COMPARISON:  March 25, 2015. FINDINGS: Mild enlargement the cardiac silhouette, likely accentuated by portable AP technique. Mild central pulmonary vascular congestion. Similar chronic mild interstitial prominence without new consolidation. No visible pleural effusions or pneumothorax. No acute osseous abnormality. IMPRESSION: 1. Mild cardiomegaly and central pulmonary vascular congestion without overt pulmonary edema. 2. Similar chronic mild interstitial prominence without new consolidation. Electronically Signed   By: Margaretha Sheffield MD   On: 04/12/2020 12:00     Assessment and Plan:   1. Unstable angina, NSTEMI by enzymes - repeat trop pending - ECG has some changes, CP responded to nitro and hx CAD - Dr Domenic Polite discussed cath w/ pt and family, they agree to proceed. - arrange tx to [facility], screen for CRFs and their control   Risk Assessment/Risk Scores:     TIMI Risk Score for Unstable Angina or Non-ST Elevation MI:   The patient's TIMI risk score is 7, which indicates a 41% risk of all cause mortality, new or recurrent myocardial infarction or need for urgent revascularization in the next 14 days.    For questions or updates, please contact [place] Please consult [email or web address] for contact info under    Signed, Rosaria Ferries, PA-C 04/12/2020 1:10 PM    Attending note:  Patient seen and examined.  I reviewed his records and updated the chart, case discussed with Ms. Ahmed Prima PA-C and also family members in the room and on the phone.  Steven. Sawchuk presents with at least a 1 week history  of worsening fatigue and shortness of breath with activity culminating in chest pain at rest consistent with unstable angina.  He was referred to the ER where high-sensitivity troponin I level is elevated at 321.  ECG shows high lateral T wave inversions and nonspecific ST changes, progressed compared to tracing from November 2021.  He has a known history of CAD status post DES to the RCA in 2005, DES to the RCA in 2017, and DES x2 for CTO LAD in 2017.  He was treated with nitroglycerin and started on heparin, chest pain-free on our evaluation and hemodynamically stable.  On examination he is in no acute distress.  Afebrile, heart rate in the 60s to 70s in sinus rhythm by telemetry which I personally reviewed.  Systolic blood pressure 04-540.  Lungs are clear without lipid breathing.  Cardiac exam reveals RRR without gallop.  Pertinent lab work includes potassium 4.1, BUN 15, creatinine 1.08, high-sensitivity troponin I 321, SARS coronavirus 2 test pending, CBC pending.  Chest x-ray reports mild cardiomegaly with central pulmonary vascular congestion.  Patient presents with unstable angina, new high lateral T wave inversions by ECG with nonspecific ST changes, high-sensitivity troponin I of 321 suggesting evolving ACS.  He has a history of CAD as discussed above, reports compliance with his medications including aspirin and Plavix.  Situation discussed with patient and family members with plan to transfer him to [facility] in anticipation of diagnostic cardiac catheterization with eye toward revascularization.  Considering risk/benefit profile, he is in agreement to proceed.  He will be transferred today for procedure.  Satira Sark, M.D., F.A.C.C.

## 2020-04-12 NOTE — ED Notes (Signed)
Paged Cardiology(Dr. Domenic Polite) for EDP.

## 2020-04-12 NOTE — ED Provider Notes (Signed)
[facility] Provider Note   CSN: [id number] Arrival date & time: 04/12/20  1035     History Chief Complaint  Patient presents with  . Chest Pain    Steven Stevens is a 75 y.o. male w/ hx of CAD s/p stent to LAD, RCA, on aspirin & plavix, obesity, HLD, HTN, presenting to emergency department with chest pain and shortness of breath.  The patient reports onset of symptoms approximately 2 weeks ago.  He says he feels like there is an elephant sitting on his chest.  This is a pressure has been persistent for the past 2 weeks, but worsened overnight.  Intensity is currently 8 out of 10.  The pain does not radiate anywhere.  Nothing is made it better or worse.  There is pressure in the middle of his chest.  He tried 3 sublingual nitroglycerin at home with very minimal improvement last night.  He did take all of his morning medications today including his aspirin and Plavix, states he has been compliant with his medicines.  He is not a smoker.  HPI     Past Medical History:  Diagnosis Date  . Anxiety    Phreesia 01/11/2020  . Chronic lower back pain   . Coronary artery disease   . Depression    "lost wife 01/14/2015"  . Depression    Phreesia 01/11/2020  . Diabetes mellitus without complication ([place])    Phreesia 01/11/2020  . High cholesterol   . Hyperlipidemia    Phreesia 01/11/2020  . Hypertension   . Myocardial infarct ([place]) 01/27/2004  . Myocardial infarction ([place])    Phreesia 01/11/2020  . Type II diabetes mellitus [facility])     Patient Active Problem List   Diagnosis Date Noted  . Insomnia, unspecified 01/12/2020  . Anxiety 01/12/2020  . Encounter to establish care 01/12/2020  . Fatigue 01/12/2020  . Immunization due 01/12/2020  . Bradycardia 12/17/2019  . Constipation 10/01/2019  . Family history of colon cancer 07/20/2016  . Hx of adenomatous colonic polyps 07/20/2016  . Normocytic anemia 07/20/2016  . Chronic total occlusion of LAD s/p CTO/PCI  wiht DES 04/14/15 04/15/2015  . Type 2 diabetes mellitus treated without insulin ([place]) 03/31/2015  . Essential hypertension 10/29/2013  . Obesity, morbid, BMI 40.0-49.9 ([place]) 10/29/2013  . Dyslipidemia 05/31/2012  . CAD S/P RCA DES '05 and 03/11/15 05/31/2012    Past Surgical History:  Procedure Laterality Date  . BACK SURGERY    . CARDIAC CATHETERIZATION N/A 03/30/2015   Procedure: Left Heart Cath and Coronary Angiography;  Surgeon: Jettie Booze, MD;  Location: [place] CV LAB;  Service: Cardiovascular;  Laterality: N/A;  . CARDIAC CATHETERIZATION  03/30/2015   Procedure: Coronary Stent Intervention;  Surgeon: Jettie Booze, MD;  Location: [place] CV LAB;  Service: Cardiovascular;;  . CARDIAC CATHETERIZATION N/A 04/14/2015   Procedure: Coronary/Bypass Graft CTO Intervention;  Surgeon: Jettie Booze, MD;  Location: [place] CV LAB;  Service: Cardiovascular;  Laterality: N/A;  . CARDIAC CATHETERIZATION  04/14/2015   Procedure: Left Heart Cath and Coronary Angiography;  Surgeon: Jettie Booze, MD;  Location: [place] CV LAB;  Service: Cardiovascular;;  . CATARACT EXTRACTION W/PHACO Right 12/03/2012   Procedure: CATARACT EXTRACTION RIGHT EYE (WITH PHACO) AND INTRAOCULAR LENS PLACEMENT  CDE=2.41;  Surgeon: Elta Guadeloupe [name], MD;  Location: [facility];  Service: Ophthalmology;  Laterality: Right;  . CATARACT EXTRACTION W/PHACO Left 12/17/2012   Procedure: CATARACT EXTRACTION PHACO AND INTRAOCULAR LENS PLACEMENT (  [place]);  Surgeon: Elta Guadeloupe [name], MD;  Location: [facility];  Service: Ophthalmology;  Laterality: Left;  CDE:7.82  . COLON SURGERY N/A    Phreesia 01/11/2020  . COLONOSCOPY  2011   Dr. Gala Romney: diverticulosis, tubular adenomas  . COLONOSCOPY WITH PROPOFOL N/A 07/24/2016   Rourk: 3 tubular adenomas removed.  Diverticulosis.  3-year surveillance colonoscopy recommended.  . COLONOSCOPY WITH PROPOFOL N/A 12/15/2019   Procedure: COLONOSCOPY WITH PROPOFOL;  Surgeon: Daneil Dolin, MD;  Location: [facility];  Service: Endoscopy;  Laterality: N/A;  8:30am  . CORONARY ANGIOPLASTY WITH STENT PLACEMENT  2005  . CORONARY STENT PLACEMENT  03/30/2015   RCA    DES  . EYE SURGERY N/A    Phreesia 01/11/2020  . HERNIA REPAIR    . KNEE ARTHROSCOPY Left 2012  . [place]   "herniated disc"  . POLYPECTOMY  07/24/2016   Procedure: POLYPECTOMY;  Surgeon: Daneil Dolin, MD;  [facility];  Service: Endoscopy;;  ascending colon polyp, splenic flexure polyps times 2  . POLYPECTOMY  12/15/2019   Procedure: POLYPECTOMY;  Surgeon: Daneil Dolin, MD;  [facility];  Service: Endoscopy;;  . UMBILICAL HERNIA REPAIR  1990s  . YAG LASER APPLICATION Right 96/78/9381   Procedure: YAG LASER APPLICATION;  Surgeon: Elta Guadeloupe [name], MD;  Location: [facility];  Service: Ophthalmology;  Laterality: Right;       Family History  Problem Relation Age of Onset  . Colon cancer Mother        deceased age 73, diagnosed with CRC in 44s.    Social History   Tobacco Use  . Smoking status: Former Smoker    Packs/day: 0.10    Years: 10.00    Pack years: 1.00    Types: Cigarettes    Quit date: 01/31/2004    Years since quitting: 16.2  . Smokeless tobacco: Never Used  Vaping Use  . Vaping Use: Never used  Substance Use Topics  . Alcohol use: No    Alcohol/week: 0.0 standard drinks  . Drug use: No    Home Medications Prior to Admission medications   Medication Sig Start Date End Date Taking? Authorizing Provider  acetaminophen (TYLENOL) 500 MG tablet Take 250 mg by mouth daily.    [provider]  ALPRAZolam Duanne Moron) 0.5 MG tablet Take 1 tablet (0.5 mg total) by mouth in the morning, at noon, and at bedtime. 02/09/20   [name], NP  aspirin EC 81 MG tablet Take 81 mg by mouth daily.    [provider]  atorvastatin (LIPITOR) 40 MG tablet TAKE 1 TABLET BY MOUTH DAILY Patient taking differently: Take 40 mg by mouth daily in  the afternoon. 06/20/16   Lendon Colonel, NP  clopidogrel (PLAVIX) 75 MG tablet Take 1 tablet (75 mg total) by mouth daily. Patient taking differently: Take 75 mg by mouth daily in the afternoon. 04/15/15   Lyda Jester M, PA-C  escitalopram (LEXAPRO) 10 MG tablet Take 1 tablet (10 mg total) by mouth daily in the afternoon. 04/02/20   [name], NP  glipiZIDE-metformin (METAGLIP) 2.5-500 MG tablet TAKE 2 TABLETS BY MOUTH IN THE MORNING AND 1 TABLET AT NIGHT 02/26/20   [name], NP  HYDROcodone-acetaminophen [facility]) 10-325 MG tablet Take 1 tablet by mouth every 8 (eight) hours as needed for moderate pain. 04/08/20 05/08/20  [name], NP  hydrocortisone (ANUSOL-HC) 2.5 % rectal cream Place 1 application  rectally 4 (four) times daily as needed (hemorrhoids/discomfort).  07/05/19   [provider]  LINZESS 145 MCG CAPS capsule Take 145 mcg by mouth daily as needed (constipation.).  06/05/19   [provider]  losartan (COZAAR) 25 MG tablet Take 1 tablet (25 mg total) by mouth daily. 03/29/20   [name], NP  metoprolol tartrate (LOPRESSOR) 25 MG tablet Take 12.5 mg by mouth daily.    [provider]  Misc Natural Products (NEURIVA PO) Take 1 tablet by mouth daily in the afternoon.    [provider]  nitroGLYCERIN (NITROSTAT) 0.4 MG SL tablet Place 1 tablet (0.4 mg total) under the tongue every 5 (five) minutes as needed for chest pain. Patient taking differently: Place 0.4 mg under the tongue every 5 (five) minutes x 3 doses as needed for chest pain. 04/15/15   Lyda Jester M, PA-C  terazosin (HYTRIN) 5 MG capsule Take 1 capsule (5 mg total) by mouth daily in the afternoon. 04/02/20   [name], NP  traZODone (DESYREL) 50 MG tablet Take 1 tablet (50 mg total) by mouth at bedtime. 03/23/20   [name], NP    Allergies    Patient has no known allergies.  Review of Systems   Review of Systems  Constitutional: Negative for chills and  fever.  HENT: Negative for ear pain and sore throat.   Eyes: Negative for pain and visual disturbance.  Respiratory: Positive for shortness of breath. Negative for cough.   Cardiovascular: Positive for chest pain. Negative for palpitations.  Gastrointestinal: Positive for nausea. Negative for abdominal pain and vomiting.  Genitourinary: Negative for dysuria and hematuria.  Musculoskeletal: Negative for arthralgias and back pain.  Skin: Negative for color change and rash.  Neurological: Negative for seizures and syncope.  All other systems reviewed and are negative.   Physical Exam Updated Vital Signs BP 130/76   Pulse 70   Temp 97.9 F (36.6 C) (Oral)   Resp 18   SpO2 94%   Physical Exam Constitutional:      General: He is not in acute distress.    Appearance: He is obese.  HENT:     Head: Normocephalic and atraumatic.  Eyes:     Conjunctiva/sclera: Conjunctivae normal.     Pupils: Pupils are equal, round, and reactive to light.  Cardiovascular:     Rate and Rhythm: Normal rate and regular rhythm.  Pulmonary:     Effort: Pulmonary effort is normal. No respiratory distress.  Abdominal:     General: There is no distension.     Tenderness: There is no abdominal tenderness.  Skin:    General: Skin is warm and dry.  Neurological:     General: No focal deficit present.     Mental Status: He is alert. Mental status is at baseline.  Psychiatric:        Mood and Affect: Mood normal.        Behavior: Behavior normal.     ED Results / Procedures / Treatments   Labs (all labs ordered are listed, but only abnormal results are displayed) Labs Reviewed  BASIC METABOLIC PANEL  CBC  TROPONIN I (HIGH SENSITIVITY)    EKG EKG Interpretation  Date/Time:  Monday April 12 2020 11:05:20 EST Ventricular Rate:  74 PR Interval:  160 QRS Duration: 111 QT Interval:  442 QTC Calculation: 491 R Axis:   49 Text Interpretation: Sinus rhythm RSR' in V1 or V2, right VCD or RVH  Abnormal  inferior Q waves Abnormal T, consider ischemia, lateral leads Minor ST depressions in I, aVL, no sig ST elevations noted, no STEMI Confirmed by Octaviano Glow [phone]) on 04/12/2020 11:08:26 AM   Radiology No results found.  Procedures .Critical Care Performed by: Wyvonnia Dusky, MD Authorized by: Wyvonnia Dusky, MD   Critical care provider statement:    Critical care time (minutes):  45   Critical care was necessary to treat or prevent imminent or life-threatening deterioration of the following conditions:  Circulatory failure   Critical care was time spent personally by me on the following activities:  Discussions with consultants, evaluation of patient's response to treatment, examination of patient, ordering and performing treatments and interventions, ordering and review of laboratory studies, ordering and review of radiographic studies, pulse oximetry, re-evaluation of patient's condition, obtaining history from patient or surrogate and review of old charts     Medications Ordered in ED Medications - No data to display  ED Course  I have reviewed the triage vital signs and the nursing notes.  Pertinent labs & imaging results that were available during my care of the patient were reviewed by me and considered in my medical decision making (see chart for details).  This patient presents to the Emergency Department with complaint of chest pain. This involves an extensive number of treatment options, and is a complaint that carries with it a high risk of complications and morbidity.  The differential diagnosis includes ACS vs Pneumothorax vs PE vs Reflux/Gastritis vs MSK pain vs Pneumonia vs other.  Concern for NSTEMI/ACS  I ordered, reviewed, and interpreted labs.  Trop 300's but stable on repeat.  CBC and BMP unremarkable.  Covid negative I ordered medication SL nitro, aspirin, heparin for chest pain/ACS I ordered imaging studies which included dg chest I  independently visualized and interpreted imaging which showed no acute findings and the monitor tracing which showed NSR Previous records obtained and reviewed showing cardiac hx I personally reviewed the patients ECG's as noted in ED course below  I consulted cardiology and discussed lab findings and history.  See ED course  Patient to be transferred to [facility] for cardiac services/potential cath today.  Keep NPO.  Family and patient updated.    Clinical Course as of 04/12/20 1432  Mon Apr 12, 2020  1123 Repeat ECG performed with posterior leads V7-V9, still does not meet STEMI criteria, I have a page out to cardiology.  We'll try nitro, aspirin, continue to repeat ECG's and monitor closely [MT]  1151 Per cardiologist Dr Harrington Challenger office assessment, pt has hx of PTCA/ DES to RCA in 2005; s/p PTCA/DES to RCA in 2017; s/p PCI with DES to CTO  in March 2017 [MT]  1152 Patient is pain free after 2 SL nitro.  Repeat ECG shows continued ST depressions in lateral leads, no STEMI  [MT]  1153 Heparin ordered [MT]  1235 I spoke to Dr Domenic Polite from cardiology who agrees with transfer to [facility] for possible cath - his team will assess pt and make transfer arrangements.  Patient remains pain free.  Needs troponin trending.  Patient and family at bedside updated about plan and in agreement. [MT]  1257 CBC with WBC 9.0, Hgb 12.8.  Platelets 231.  Problem with lab machine and uploading results [MT]    Clinical Course User Index [MT] Mckaylie Vasey, Carola Rhine, MD    Final Clinical Impression(s) / ED Diagnoses Final diagnoses:  None    Rx / DC Orders ED Discharge Orders  None       Wyvonnia Dusky, MD 04/12/20 1434

## 2020-04-12 NOTE — ED Notes (Signed)
Date and time results received: 04/12/20 1152  Test: trop Critical Value: 321  Name of Provider Notified: Trifan   Repeat ekg complete, pt denies pain at this time, md notified that after 2 ntg pt was pain free.

## 2020-04-12 NOTE — Interval H&P Note (Signed)
Cath Lab Visit (complete for each Cath Lab visit)  Clinical Evaluation Leading to the Procedure:   ACS: Yes.    Non-ACS:    Anginal Classification: CCS IV  Anti-ischemic medical therapy: Minimal Therapy (1 class of medications)  Non-Invasive Test Results: No non-invasive testing performed  Prior CABG: No previous CABG      History and Physical Interval Note:  04/12/2020 3:06 PM  Steven Stevens  has presented today for surgery, with the diagnosis of chest pain.  The various methods of treatment have been discussed with the patient and family. After consideration of risks, benefits and other options for treatment, the patient has consented to  Procedure(s): LEFT HEART CATH AND CORONARY ANGIOGRAPHY (N/A) as a surgical intervention.  The patient's history has been reviewed, patient examined, no change in status, stable for surgery.  I have reviewed the patient's chart and labs.  Questions were answered to the patient's satisfaction.     Sherren Mocha

## 2020-04-12 NOTE — ED Triage Notes (Signed)
Chest pain with shortness of breath ?

## 2020-04-12 NOTE — Telephone Encounter (Signed)
New message   Daughter Selinda Eon called this in , would like to speak with someone    Pt c/o of Chest Pain: STAT if CP now or developed within 24 hours  1. Are you having CP right now?  no  2. Are you experiencing any other symptoms (ex. SOB, nausea, vomiting, sweating)? Sob for a few days, has not been feeling well   3. How long have you been experiencing CP? About a hour yesterday   4. Is your CP continuous or coming and going? Comes and goes   5. Have you taken Nitroglycerin? Yes and a tums and omeprazole  ?

## 2020-04-12 NOTE — Progress Notes (Signed)
TR band removed and pressure dressing applied with no complications. Pt educated and had no questions. Pt able to provide teach back on pressure dressing education. Will continue to monitor.

## 2020-04-13 ENCOUNTER — Other Ambulatory Visit: Payer: Self-pay

## 2020-04-13 ENCOUNTER — Encounter: Payer: Self-pay | Admitting: *Deleted

## 2020-04-13 ENCOUNTER — Observation Stay (HOSPITAL_BASED_OUTPATIENT_CLINIC_OR_DEPARTMENT_OTHER): Payer: Medicare Other

## 2020-04-13 DIAGNOSIS — I351 Nonrheumatic aortic (valve) insufficiency: Secondary | ICD-10-CM

## 2020-04-13 DIAGNOSIS — E785 Hyperlipidemia, unspecified: Secondary | ICD-10-CM | POA: Diagnosis not present

## 2020-04-13 DIAGNOSIS — Z7984 Long term (current) use of oral hypoglycemic drugs: Secondary | ICD-10-CM | POA: Diagnosis not present

## 2020-04-13 DIAGNOSIS — Z006 Encounter for examination for normal comparison and control in clinical research program: Secondary | ICD-10-CM

## 2020-04-13 DIAGNOSIS — G4733 Obstructive sleep apnea (adult) (pediatric): Secondary | ICD-10-CM

## 2020-04-13 DIAGNOSIS — E119 Type 2 diabetes mellitus without complications: Secondary | ICD-10-CM | POA: Diagnosis not present

## 2020-04-13 DIAGNOSIS — R079 Chest pain, unspecified: Secondary | ICD-10-CM

## 2020-04-13 DIAGNOSIS — E1169 Type 2 diabetes mellitus with other specified complication: Secondary | ICD-10-CM | POA: Diagnosis not present

## 2020-04-13 DIAGNOSIS — I10 Essential (primary) hypertension: Secondary | ICD-10-CM

## 2020-04-13 DIAGNOSIS — Z79899 Other long term (current) drug therapy: Secondary | ICD-10-CM | POA: Diagnosis not present

## 2020-04-13 DIAGNOSIS — Z87891 Personal history of nicotine dependence: Secondary | ICD-10-CM | POA: Diagnosis not present

## 2020-04-13 DIAGNOSIS — R001 Bradycardia, unspecified: Secondary | ICD-10-CM | POA: Diagnosis not present

## 2020-04-13 DIAGNOSIS — E669 Obesity, unspecified: Secondary | ICD-10-CM

## 2020-04-13 DIAGNOSIS — I214 Non-ST elevation (NSTEMI) myocardial infarction: Secondary | ICD-10-CM | POA: Diagnosis not present

## 2020-04-13 DIAGNOSIS — Z20822 Contact with and (suspected) exposure to covid-19: Secondary | ICD-10-CM | POA: Diagnosis not present

## 2020-04-13 DIAGNOSIS — R748 Abnormal levels of other serum enzymes: Secondary | ICD-10-CM | POA: Diagnosis not present

## 2020-04-13 LAB — ECHOCARDIOGRAM COMPLETE
Area-P 1/2: 3.68 cm2
Calc EF: 63 %
Height: 68.5 in
S' Lateral: 3.7 cm
Single Plane A2C EF: 63.5 %
Single Plane A4C EF: 63.8 %
Weight: 5174.64 oz

## 2020-04-13 LAB — COMPREHENSIVE METABOLIC PANEL
ALT: 26 U/L (ref 0–44)
AST: 36 U/L (ref 15–41)
Albumin: 3.2 g/dL — ABNORMAL LOW (ref 3.5–5.0)
Alkaline Phosphatase: 25 U/L — ABNORMAL LOW (ref 38–126)
Anion gap: 8 (ref 5–15)
BUN: 11 mg/dL (ref 8–23)
CO2: 27 mmol/L (ref 22–32)
Calcium: 8.7 mg/dL — ABNORMAL LOW (ref 8.9–10.3)
Chloride: 102 mmol/L (ref 98–111)
Creatinine, Ser: 1.19 mg/dL (ref 0.61–1.24)
GFR, Estimated: >60 mL/min (ref >60)
Glucose, Bld: 113 mg/dL — ABNORMAL HIGH (ref 70–99)
Potassium: 3.7 mmol/L (ref 3.5–5.1)
Sodium: 137 mmol/L (ref 135–145)
Total Bilirubin: 0.9 mg/dL (ref 0.3–1.2)
Total Protein: 5.8 g/dL — ABNORMAL LOW (ref 6.5–8.1)

## 2020-04-13 LAB — CBC
HCT: 34 % — ABNORMAL LOW (ref 39.0–52.0)
Hemoglobin: 11.5 g/dL — ABNORMAL LOW (ref 13.0–17.0)
MCH: 31.1 pg (ref 26.0–34.0)
MCHC: 33.8 g/dL (ref 30.0–36.0)
MCV: 91.9 fL (ref 80.0–100.0)
Platelets: 210 10*3/uL (ref 150–400)
RBC: 3.7 MIL/uL — ABNORMAL LOW (ref 4.22–5.81)
RDW: 13.8 % (ref 11.5–15.5)
WBC: 9.1 10*3/uL (ref 4.0–10.5)
nRBC: 0 % (ref 0.0–0.2)

## 2020-04-13 LAB — GLUCOSE, CAPILLARY
Glucose-Capillary: 140 mg/dL — ABNORMAL HIGH (ref 70–99)
Glucose-Capillary: 152 mg/dL — ABNORMAL HIGH (ref 70–99)

## 2020-04-13 MED ORDER — NITROGLYCERIN 0.4 MG SL SUBL: 0.4000 mg | SUBLINGUAL_TABLET | SUBLINGUAL | 3 refills | Status: DC | PRN

## 2020-04-13 MED ORDER — PERFLUTREN LIPID MICROSPHERE
1.0000 mL | INTRAVENOUS | Status: AC | PRN
  Administered 2020-04-13: 2 mL via INTRAVENOUS
  Filled 2020-04-13: qty 10

## 2020-04-13 MED ORDER — LOSARTAN POTASSIUM 25 MG PO TABS: 25.0000 mg | ORAL_TABLET | Freq: Every day | ORAL | 0 refills | Status: DC

## 2020-04-13 MED ORDER — ATORVASTATIN CALCIUM 80 MG PO TABS: 80.0000 mg | ORAL_TABLET | Freq: Every day | ORAL | 6 refills | Status: DC

## 2020-04-13 NOTE — Telephone Encounter (Signed)
Reviewed Pt went to ED  NSTEMI    Had PTCA/Stent    Home today

## 2020-04-13 NOTE — Care Management Obs Status (Signed)
[place] NOTIFICATION   Patient Details  Name: Steven Stevens MRN: [id number] Date of Birth: [birth year]   Medicare Observation Status Notification Given:  Yes    Zenon Mayo, RN 04/13/2020, 11:34 AM

## 2020-04-13 NOTE — Progress Notes (Signed)
CARDIAC REHAB PHASE I   PRE:  Rate/Rhythm: 74 SR    BP: sitting 160/78    SaO2: 99 RA  MODE:  Ambulation: 360 ft   POST:  Rate/Rhythm: 116 ST    BP: sitting 170/91     SaO2: 97 RA  Pt needed mod assist to get out of bed. Stood and walked independently with RW. HR elevated with distance but felt well, no CP or SOB. He has RW and cane at home if needed. To recliner and discussed MI, stent, restrictions, Plavix, diet, exercise, NTG and CRPII. Pt receptive. He knows he needs better diet and exercise. Will refer to [place]. [phone]   Darrick Meigs CES, ACSM 04/13/2020 9:18 AM

## 2020-04-13 NOTE — Progress Notes (Signed)
Progress Note  Patient Name: Steven Stevens Date of Encounter: 04/13/2020  [facility] Cardiologist: [name], MD   Subjective   Has not had angina since the procedure.  Denies dyspnea.  No problems at cath access site.  Inpatient Medications    Scheduled Meds: . aspirin  81 mg Oral Daily  . clopidogrel  75 mg Oral Q breakfast  . insulin aspart  0-15 Units Subcutaneous TID WC  . insulin aspart  0-5 Units Subcutaneous QHS  . sodium chloride flush  3 mL Intravenous Q12H  . sodium chloride flush  3 mL Intravenous Q12H   Continuous Infusions: . sodium chloride    . sodium chloride     PRN Meds: sodium chloride, sodium chloride, acetaminophen, ALPRAZolam, nitroGLYCERIN, ondansetron (ZOFRAN) IV, sodium chloride flush, sodium chloride flush, zolpidem   Vital Signs    Vitals:   04/12/20 1948 04/12/20 2300 04/13/20 0300 04/13/20 0751  BP: 122/64 130/75 (!) 144/74 (!) 160/78  Pulse: 71 75 70 68  Resp: 16 18 18 16   Temp: 97.9 F (36.6 C) 98 F (36.7 C) 98.3 F (36.8 C) 98.5 F (36.9 C)  TempSrc: Oral Oral Oral Oral  SpO2: 98% 94% 94% 98%  Weight:   (!) 146.7 kg   Height:        Intake/Output Summary (Last 24 hours) at 04/13/2020 0928 Last data filed at 04/13/2020 0700 Gross per 24 hour  Intake 440 ml  Output 900 ml  Net -460 ml   Last 3 Weights 04/13/2020 04/12/2020 04/12/2020  Weight (lbs) 323 lb 6.6 oz 302 lb 311 lb  Weight (kg) 146.7 kg 136.986 kg 141.069 kg      Telemetry    No arrhythmia overnight.- Personally Reviewed  ECG    Sinus rhythm, possible old inferior infarction, broad anterior and lateral T wave inversion, QTC prolonged at 550 ms- Personally Reviewed  Physical Exam  Morbidly obese GEN: No acute distress.   Neck: No JVD Cardiac: RRR, no murmurs, rubs, or gallops.  Respiratory: Clear to auscultation bilaterally. GI: Soft, nontender, non-distended  MS: No edema; No deformity. Neuro:  Nonfocal  Psych: Normal affect   Labs    High  Sensitivity Troponin:   Recent Labs  Lab 04/12/20 1112 04/12/20 1224 04/12/20 1744  TROPONINIHS 321* 390* 1,219*      Chemistry Recent Labs  Lab 04/12/20 1112 04/13/20 0015  NA 137 137  K 4.1 3.7  CL 101 102  CO2 24 27  GLUCOSE 125* 113*  BUN 15 11  CREATININE 1.08 1.19  CALCIUM 9.1 8.7*  PROT  --  5.8*  ALBUMIN  --  3.2*  AST  --  36  ALT  --  26  ALKPHOS  --  25*  BILITOT  --  0.9  GFRNONAA >60 >60  ANIONGAP 12 8     Hematology Recent Labs  Lab 04/12/20 1112 04/13/20 0015  WBC 9.0 9.1  RBC 4.17* 3.70*  HGB 12.7* 11.5*  HCT 40.3 34.0*  MCV 96.6 91.9  MCH 30.5 31.1  MCHC 31.5 33.8  RDW 14.1 13.8  PLT 236 210    BNP Recent Labs  Lab 04/12/20 1112  BNP 45.0     DDimer No results for input(s): DDIMER in the last 168 hours.   Radiology    CARDIAC CATHETERIZATION  Result Date: 04/12/2020 1. Patent stents in the LAD and RCA with mild diffuse in-stent restneosis 2. Severe eccentric stenosis of the proximal LAD, treated with PCI using a  4.0x22 mm Resolute Onyx DES under OCT guidance. 3. Moderately elevated LVEDP Recommend: ASA and clopidogrel at least 12 months without interruption, aggressive medical therapy. Favor long-term DAPT if tolerated. OK for discharge tomorrow if no complicating features.   DG Chest Port 1 View  Result Date: 04/12/2020 CLINICAL DATA:  Pending COVID test.  Shortness of breath. EXAM: PORTABLE CHEST 1 VIEW COMPARISON:  March 25, 2015. FINDINGS: Mild enlargement the cardiac silhouette, likely accentuated by portable AP technique. Mild central pulmonary vascular congestion. Similar chronic mild interstitial prominence without new consolidation. No visible pleural effusions or pneumothorax. No acute osseous abnormality. IMPRESSION: 1. Mild cardiomegaly and central pulmonary vascular congestion without overt pulmonary edema. 2. Similar chronic mild interstitial prominence without new consolidation. Electronically Signed   By: Margaretha Sheffield MD   On: 04/12/2020 12:00    Cardiac Studies   Diagnostic Dominance: Right    Intervention     Implants    Permanent Stent   Stent Resolute Onyx 4.0x22 - [id number] - Implanted      Patient Profile     75 y.o. male with CAD and previous percutaneous intervention to RCA and LAD, type 2 diabetes mellitus, hypertension, hyperlipidemia, morbid obesity, obstructive sleep apnea not on CPAP, history of bradycardia, presenting with non-STEMI and found to have a new high-grade stenosis in the proximal LAD artery, treated with a drug-eluting stent on 04/12/2020  Assessment & Plan    Small non-STEMI.  Troponin peaked at 1219.  Normal BNP.   Asymptomatic since stent implantation. No problems at the cath access site: No hematoma or ecchymosis, excellent pulses. Reinforced the need for mandatory dual antiplatelet therapy for the next 12 months. Increase atorvastatin to 80 mg daily.  LDL preprocedure was not at goal.  May need to also add ezetimibe if repeat labs still show LDL greater than 70.  Weight loss encouraged., BP slightly high today, but normal overnight and he reports that at home his systolic blood pressure is never higher than 130, often in the high 90s in the morning.  No adjustment to his medications.  Will not tolerate higher doses of beta-blocker due to bradycardia. Borderline glycemic control with hemoglobin A1c 7%. Being considered for participation in the SOS-AMI trial.  He appears to be interested. Encouraged him again to consider use of CPAP.  He still has the equipment at home. DC home later today.  For questions or updates, please contact [place] Please consult [email or web address] for contact info under        Signed, Sanda Klein, MD  04/13/2020, 9:28 AM

## 2020-04-13 NOTE — Discharge Summary (Addendum)
Discharge Summary    Patient ID: Steven Stevens MRN: [id number]; DOB: [birth year]  Admit date: 04/12/2020 Discharge date: 04/13/2020  Primary Care Provider: [name], NP  Primary Cardiologist: [name], MD  Primary Electrophysiologist:  None   Discharge Diagnoses    Active Problems:   Non-ST elevation (NSTEMI) myocardial infarction [facility])   NSTEMI (non-ST elevated myocardial infarction) [facility])   Allergies No Known Allergies  Diagnostic Studies/Procedures    ECHO: Pending  CARDIAC CATH: 04/12/2020 1. Patent stents in the LAD and RCA with mild diffuse in-stent restneosis 2. Severe eccentric stenosis of the proximal LAD, treated with PCI using a 4.0x22 mm Resolute Onyx DES under OCT guidance.  3. Moderately elevated LVEDP  Recommend: ASA and clopidogrel at least 12 months without interruption, aggressive medical therapy. Favor long-term DAPT if tolerated. OK for discharge tomorrow if no complicating features.  Left Anterior Descending  The vessel exhibits minimal luminal irregularities.  Collaterals  Dist LAD filled by collaterals from 1st Diag.    Ost LAD to Prox LAD lesion is 90% stenosed. The lesion is eccentric.  Mid LAD to Dist LAD lesion is 25% stenosed. The lesion was previously treated using a drug eluting stent over 2 years ago. Mild diffuse ISR without any flow-limiting lesions identified.  Previously placed Dist LAD drug eluting stent is widely patent.  Left Circumflex  The vessel exhibits minimal luminal irregularities.  Right Coronary Artery  The vessel exhibits minimal luminal irregularities.  Dist RCA-1 lesion is 25% stenosed. The lesion was previously treatedover 2 years ago.  Non-stenotic Dist RCA-2 lesion was previously treated. The lesion is type C and located at the major branch.  Intervention        _____________   History of Present Illness     Steven Stevens is a 75 y.o. male with history of coronary artery disease status post RCA  PCI in 2005 and DES LAD 2017, DM2, HTN, HLD, OSA not on CPAP,and  bradycardia, who went to [facility] 03/07 with chest pain, NSTEMI by enzymes.  Hospital Course     Consultants: None   He was seen by cardiology in the ER and transferred to [facility] for cath.  Cath results above, he had a de novo LAD lesion, treated with DES. Otherwise, had diffuse non-obstructive disease, mainly in previously placed stents >> medical therapy.  He was placed on higher dose statin, continued on ASA, Plavix and BP meds. Continue low-dose BB, no increase w/ hx bradycardia.  On 03/08, he was seen by Dr [name] and all data were reviewed. He was ambulating without chest pain or SOB and no problems with cath site.   He was seen by cardiac rehab and referred for outpt rehab as well.  He is being considered for a study, the Research team contacted him.  No further inpatient workup is indicated and he is considered stable for discharge, to follow up as an outpt.  Did the patient have an acute coronary syndrome (MI, NSTEMI, STEMI, etc) this admission?:  Yes                               AHA/ACC Clinical Performance & Quality Measures: 1. Aspirin prescribed? - Yes 2. ADP Receptor Inhibitor (Plavix/Clopidogrel, Brilinta/Ticagrelor or Effient/Prasugrel) prescribed (includes medically managed patients)? - Yes 3. Beta Blocker prescribed? - Yes 4. High Intensity Statin (Lipitor 40-80mg  or Crestor 20-40mg ) prescribed? - Yes 5. EF assessed during THIS hospitalization? -  Yes 6. For EF <40%, was ACEI/ARB prescribed? - Not Applicable (EF >/= 23%) 7. For EF <40%, Aldosterone Antagonist (Spironolactone or Eplerenone) prescribed? - Not Applicable (EF >/= 76%) 8. Cardiac Rehab Phase II ordered (including medically managed patients)? - Yes   _____________  Discharge Vitals Blood pressure (!) 160/78, pulse 68, temperature 98.5 F (36.9 C), temperature source Oral, resp. rate 16, height 5' 8.5" (1.74 m), weight (!) 146.7 kg, SpO2 98  %.  Filed Weights   04/12/20 1200 04/12/20 1220 04/13/20 0300  Weight: (!) 141.1 kg (!) 137 kg (!) 146.7 kg    Labs & Radiologic Studies    CBC Recent Labs    04/12/20 1112 04/13/20 0015  WBC 9.0 9.1  HGB 12.7* 11.5*  HCT 40.3 34.0*  MCV 96.6 91.9  PLT 236 283   Basic Metabolic Panel Recent Labs    04/12/20 1112 04/13/20 0015  NA 137 137  K 4.1 3.7  CL 101 102  CO2 24 27  GLUCOSE 125* 113*  BUN 15 11  CREATININE 1.08 1.19  CALCIUM 9.1 8.7*   Liver Function Tests Recent Labs    04/13/20 0015  AST 36  ALT 26  ALKPHOS 25*  BILITOT 0.9  PROT 5.8*  ALBUMIN 3.2*   No results for input(s): LIPASE, AMYLASE in the last 72 hours. High Sensitivity Troponin:   Recent Labs  Lab 04/12/20 1112 04/12/20 1224 04/12/20 1744  TROPONINIHS 321* 390* 1,219*    BNP Invalid input(s): POCBNP D-Dimer No results for input(s): DDIMER in the last 72 hours. Hemoglobin A1C Recent Labs    04/12/20 1744  HGBA1C 7.0*   Fasting Lipid Panel Recent Labs    04/12/20 1744  CHOL 148  HDL 40*  LDLCALC 85  TRIG 117  CHOLHDL 3.7   Thyroid Function Tests Recent Labs    04/12/20 1744  TSH 1.966   _____________  CARDIAC CATHETERIZATION  Result Date: 04/12/2020 1. Patent stents in the LAD and RCA with mild diffuse in-stent restneosis 2. Severe eccentric stenosis of the proximal LAD, treated with PCI using a 4.0x22 mm Resolute Onyx DES under OCT guidance. 3. Moderately elevated LVEDP Recommend: ASA and clopidogrel at least 12 months without interruption, aggressive medical therapy. Favor long-term DAPT if tolerated. OK for discharge tomorrow if no complicating features.   DG Chest Port 1 View  Result Date: 04/12/2020 CLINICAL DATA:  Pending COVID test.  Shortness of breath. EXAM: PORTABLE CHEST 1 VIEW COMPARISON:  March 25, 2015. FINDINGS: Mild enlargement the cardiac silhouette, likely accentuated by portable AP technique. Mild central pulmonary vascular congestion. Similar  chronic mild interstitial prominence without new consolidation. No visible pleural effusions or pneumothorax. No acute osseous abnormality. IMPRESSION: 1. Mild cardiomegaly and central pulmonary vascular congestion without overt pulmonary edema. 2. Similar chronic mild interstitial prominence without new consolidation. Electronically Signed   By: Margaretha Sheffield MD   On: 04/12/2020 12:00   Disposition   Pt is being discharged home today in improved condition.  Follow-up Plans & Appointments     Follow-up Information    Fay Records, MD Follow up.   Specialty: Cardiology Why: Keep appt next week  Contact information: [address] [place] [address] [phone]              Discharge Instructions    Amb Referral to Cardiac Rehabilitation   Complete by: As directed    Diagnosis:  Coronary Stents NSTEMI PTCA     After initial evaluation and assessments completed:  Virtual Based Care may be provided alone or in conjunction with Phase 2 Cardiac Rehab based on patient barriers.: Yes   Diet Carb Modified   Complete by: As directed    Increase activity slowly   Complete by: As directed       Discharge Medications   Allergies as of 04/13/2020   No Known Allergies     Medication List    TAKE these medications   acetaminophen 500 MG tablet Commonly known as: TYLENOL Take 250 mg by mouth daily.   ALPRAZolam 0.5 MG tablet Commonly known as: XANAX Take 1 tablet (0.5 mg total) by mouth in the morning, at noon, and at bedtime.   aspirin EC 81 MG tablet Take 81 mg by mouth daily.   atorvastatin 80 MG tablet Commonly known as: LIPITOR Take 1 tablet (80 mg total) by mouth daily. What changed:   medication strength  how much to take   clopidogrel 75 MG tablet Commonly known as: PLAVIX Take 1 tablet (75 mg total) by mouth daily. What changed: when to take this   escitalopram 10 MG tablet Commonly known as: LEXAPRO Take 1 tablet (10 mg total) by mouth  daily in the afternoon.   glipiZIDE-metformin 2.5-500 MG tablet Commonly known as: METAGLIP TAKE 2 TABLETS BY MOUTH IN THE MORNING AND 1 TABLET AT NIGHT Notes to patient: HOLD for 48 hours, restart on 04/15/2020   HYDROcodone-acetaminophen 10-325 MG tablet Commonly known as: NORCO Take 1 tablet by mouth every 8 (eight) hours as needed for moderate pain.   hydrocortisone 2.5 % rectal cream Commonly known as: ANUSOL-HC Place 1 application rectally 4 (four) times daily as needed (hemorrhoids/discomfort).   Linzess 145 MCG Caps capsule Generic drug: linaclotide Take 145 mcg by mouth daily as needed (constipation.).   losartan 25 MG tablet Commonly known as: COZAAR Take 1 tablet (25 mg total) by mouth daily.   metoprolol tartrate 25 MG tablet Commonly known as: LOPRESSOR Take 12.5 mg by mouth 2 (two) times daily.   NEURIVA PO Take 1 tablet by mouth daily in the afternoon.   nitroGLYCERIN 0.4 MG SL tablet Commonly known as: NITROSTAT Place 1 tablet (0.4 mg total) under the tongue every 5 (five) minutes x 3 doses as needed for chest pain.   terazosin 5 MG capsule Commonly known as: HYTRIN Take 1 capsule (5 mg total) by mouth daily in the afternoon.   traZODone 50 MG tablet Commonly known as: DESYREL Take 1 tablet (50 mg total) by mouth at bedtime.          Outstanding Labs/Studies   ECHO  Duration of Discharge Encounter   Greater than 30 minutes including physician time.  Signed, Rosaria Ferries, PA-C 04/13/2020, 10:31 AM

## 2020-04-13 NOTE — Progress Notes (Signed)
  Echocardiogram 2D Echocardiogram has been performed.  Steven Stevens 04/13/2020, 10:22 AM

## 2020-04-13 NOTE — Discharge Instructions (Signed)
PLEASE REMEMBER TO BRING ALL OF YOUR MEDICATIONS TO EACH OF YOUR FOLLOW-UP OFFICE VISITS. ° °PLEASE ATTEND ALL SCHEDULED FOLLOW-UP APPOINTMENTS.  ° °Activity: Increase activity slowly as tolerated. You may shower, but no soaking baths (or swimming) for 1 week. No driving for 1 week. No lifting over 5 lbs for 2 weeks. No sexual activity for 1 week.  ° °You May Return to Work: in 3 weeks (if applicable) ° °Wound Care: You may wash cath site gently with soap and water. Keep cath site clean and dry. If you notice pain, swelling, bleeding or pus at your cath site, please call [phone]. ° ° ° °Cardiac Cath Site Care °Refer to this sheet in the next few weeks. These instructions provide you with information on caring for yourself after your procedure. Your caregiver may also give you more specific instructions. Your treatment has been planned according to current medical practices, but problems sometimes occur. Call your caregiver if you have any problems or questions after your procedure. °HOME CARE INSTRUCTIONS °· You may shower 24 hours after the procedure. Remove the bandage (dressing) and gently wash the site with plain soap and water. Gently pat the site dry.  °· Do not apply powder or lotion to the site.  °· Do not sit in a bathtub, swimming pool, or whirlpool for 5 to 7 days.  °· No bending, squatting, or lifting anything over 10 pounds (4.5 kg) as directed by your caregiver.  °· Inspect the site at least twice daily.  °· Do not drive home if you are discharged the same day of the procedure. Have someone else drive you.  °· You may drive 24 hours after the procedure unless otherwise instructed by your caregiver.  °What to expect: °· Any bruising will usually fade within 1 to 2 weeks.  °· Blood that collects in the tissue (hematoma) may be painful to the touch. It should usually decrease in size and tenderness within 1 to 2 weeks.  °SEEK IMMEDIATE MEDICAL CARE IF: °· You have unusual pain at the site or down the  affected limb.  °· You have redness, warmth, swelling, or pain at the site.  °· You have drainage (other than a small amount of blood on the dressing).  °· You have chills.  °· You have a fever or persistent symptoms for more than 72 hours.  °· You have a fever and your symptoms suddenly get worse.  °· Your leg becomes pale, cool, tingly, or numb.  °· You have heavy bleeding from the site. Hold pressure on the site.  °Document Released: 02/25/2010 Document Revised: 01/12/2011 Document Reviewed:  ° °

## 2020-04-13 NOTE — Research (Signed)
ID- [id number],  [email or web address]  Authorized Site Personnel  [x]   Shirley Muscat, RN []   Philemon Kingdom, RN  []   Amy Enid Derry, RCIS  []   Berneda Rose, RN   SUBJECT NAME: __[name]___ MRN: _[id number]_   Date of study introduction: __08-MAR-2022___   ([id number])    Time of introduction: _1030______  (24 hour clock)  During the subject's hospital visit , the authorized site personnel discussed with  with the subject the possibility to participate in the SOS-AMI study, and provided  the subject with the approved Subject Information Leaflet (SIL)-Informed Consent form (ICF) in a language that he can understand.  The subject took the document home to reflect on his participation in this study. He is very interested in this study  [x]   The Subject will return to the Cardiovascular Research office, at a later day, to            complete the consenting / training process.  OR  []   The Subject will be given ample time to reflect on this study but will be completing            consent process prior to his/ her discharge from this admission.   **Form based on [employer] [id number] [id number] [id number]

## 2020-04-13 NOTE — Care Management CC44 (Signed)
Condition Code 44 Documentation Completed  Patient Details  Name: Steven Stevens MRN: [id number] Date of Birth: 12/21/1945   Condition Code 44 given:  Yes Patient signature on Condition Code 44 notice:  Yes Documentation of 2 MD's agreement:  Yes Code 44 added to claim:  Yes    Zenon Mayo, RN 04/13/2020, 11:34 AM

## 2020-04-14 ENCOUNTER — Telehealth: Payer: Self-pay

## 2020-04-14 NOTE — Telephone Encounter (Signed)
Transition Care Management Follow-up Telephone Call  Date of discharge and from where: 04/13/20 from [facility]  How have you been since you were released from the hospital? Pt states he is doing good, trying to get his strength up.   Any questions or concerns? No  Items Reviewed:  Did the pt receive and understand the discharge instructions provided? Yes   Medications obtained and verified? Yes   Other? No   Any new allergies since your discharge? No   Dietary orders reviewed? Yes, heart healthy.   Do you have support at home? Yes   Home Care and Equipment/Supplies: Were home health services ordered? no If so, what is the name of the agency? N/A  Has the agency set up a time to come to the patient's home? not applicable Were any new equipment or medical supplies ordered?  No What is the name of the medical supply agency? N/A Were you able to get the supplies/equipment? not applicable Do you have any questions related to the use of the equipment or supplies? No  Functional Questionnaire: (I = Independent and D = Dependent) ADLs: I  Bathing/Dressing- I  Meal Prep- I  Eating- I  Maintaining continence- I  Transferring/Ambulation- I  Managing Meds- I  Follow up appointments reviewed:   PCP Hospital f/u appt confirmed? Yes  Scheduled to see Donneta Romberg on 04/23/20 @ 10:40am.  [facility] f/u appt confirmed? Yes  Scheduled to see [name], MD on 04/20/20 @ 1:00pm.  Are transportation arrangements needed? No   If their condition worsens, is the pt aware to call PCP or go to the Emergency Dept.? Yes  Was the patient provided with contact information for the PCP's office or ED? Yes  Was to pt encouraged to call back with questions or concerns? Yes

## 2020-04-19 NOTE — Progress Notes (Signed)
Cardiology Office Note   Date:  04/20/2020   ID:  Steven Stevens, DOB November 26, 1945, MRN [id number]  PCP:  [name], NP  Cardiologist:   [name], MD   F/U of CAD     History of Present Illness: Steven Stevens is a 75 y.o. male with a history of CAD He is s/p PTCA/ DES to RCA in 2005; s/p PTCA/DES to RCA in 2017; s/p PCI with DES to CTO  in March 2017)  Also he has a hstory of COPD, morbid obesity, HL and type II DM   I saw the pt in 2019   Since then he was seen by L Kilroy in Nov 2021 The pt says a few weeks ago he developed more SOB with activity and DOE  He  was admitted in March for CP , NSTEMI   He underwent cardiac cath which showed a new LAD lesion   Underwent PTCA/DES to LAD   Statin dose was increased     Since d/c he says his breathing is much better   Denies CP   No dizziness  R wrist is a little sore    Diet:  Breakfast Eggs / sausage Lunch salad Dinner  Chicken, salad    Drinks water and diet coke    Current Meds  Medication Sig  . acetaminophen (TYLENOL) 500 MG tablet Take 250 mg by mouth daily.  Marland Kitchen ALPRAZolam (XANAX) 0.5 MG tablet Take 1 tablet (0.5 mg total) by mouth in the morning, at noon, and at bedtime.  Marland Kitchen aspirin EC 81 MG tablet Take 81 mg by mouth daily.  Marland Kitchen atorvastatin (LIPITOR) 80 MG tablet Take 1 tablet (80 mg total) by mouth daily.  . clopidogrel (PLAVIX) 75 MG tablet Take 1 tablet (75 mg total) by mouth daily. (Patient taking differently: Take 75 mg by mouth daily in the afternoon.)  . escitalopram (LEXAPRO) 10 MG tablet Take 1 tablet (10 mg total) by mouth daily in the afternoon.  Marland Kitchen glipiZIDE-metformin (METAGLIP) 2.5-500 MG tablet TAKE 2 TABLETS BY MOUTH IN THE MORNING AND 1 TABLET AT NIGHT  . HYDROcodone-acetaminophen (NORCO) 10-325 MG tablet Take 1 tablet by mouth every 8 (eight) hours as needed for moderate pain.  . hydrocortisone (ANUSOL-HC) 2.5 % rectal cream Place 1 application rectally 4 (four) times daily as needed  (hemorrhoids/discomfort).   Marland Kitchen LINZESS 145 MCG CAPS capsule Take 145 mcg by mouth daily as needed (constipation.).   Marland Kitchen losartan (COZAAR) 25 MG tablet Take 1 tablet (25 mg total) by mouth daily.  . metoprolol tartrate (LOPRESSOR) 25 MG tablet Take 12.5 mg by mouth 2 (two) times daily.  . Misc Natural Products (NEURIVA PO) Take 1 tablet by mouth daily in the afternoon.  . nitroGLYCERIN (NITROSTAT) 0.4 MG SL tablet Place 1 tablet (0.4 mg total) under the tongue every 5 (five) minutes x 3 doses as needed for chest pain.  Marland Kitchen terazosin (HYTRIN) 5 MG capsule Take 1 capsule (5 mg total) by mouth daily in the afternoon.  . traZODone (DESYREL) 50 MG tablet Take 1 tablet (50 mg total) by mouth at bedtime.     Allergies:   Patient has no known allergies.   Past Medical History:  Diagnosis Date  . Anxiety   . Chronic lower back pain   . Coronary artery disease    DES to RCA 2005, DES to RCA 2017, DES x2 to CTO LAD 2017  . Depression   . Hyperlipidemia   . Hypertension   .  Myocardial infarct ([place]) 01/27/2004  . Type 2 diabetes mellitus ([place])   . Type II diabetes mellitus ([place])     Past Surgical History:  Procedure Laterality Date  . BACK SURGERY    . CARDIAC CATHETERIZATION N/A 03/30/2015   Procedure: Left Heart Cath and Coronary Angiography;  Surgeon: Jettie Booze, MD;  Location: [facility];  Service: Cardiovascular;  Laterality: N/A;  . CARDIAC CATHETERIZATION  03/30/2015   Procedure: Coronary Stent Intervention;  Surgeon: Jettie Booze, MD;  Location: [facility];  Service: Cardiovascular;;  . CARDIAC CATHETERIZATION N/A 04/14/2015   Procedure: Coronary/Bypass Graft CTO Intervention;  Surgeon: Jettie Booze, MD;  Location: [facility];  Service: Cardiovascular;  Laterality: N/A;  . CARDIAC CATHETERIZATION  04/14/2015   Procedure: Left Heart Cath and Coronary Angiography;  Surgeon: Jettie Booze, MD;  Location: [facility];  Service:  Cardiovascular;;  . CATARACT EXTRACTION W/PHACO Right 12/03/2012   Procedure: CATARACT EXTRACTION RIGHT EYE (WITH PHACO) AND INTRAOCULAR LENS PLACEMENT  CDE=2.41;  Surgeon: Elta Guadeloupe [name], MD;  Location: [facility];  Service: Ophthalmology;  Laterality: Right;  . CATARACT EXTRACTION W/PHACO Left 12/17/2012   Procedure: CATARACT EXTRACTION PHACO AND INTRAOCULAR LENS PLACEMENT (IOC);  Surgeon: Elta Guadeloupe [name], MD;  Location: [facility];  Service: Ophthalmology;  Laterality: Left;  CDE:7.82  . COLON SURGERY N/A    Phreesia 01/11/2020  . COLONOSCOPY  2011   Dr. Gala Romney: diverticulosis, tubular adenomas  . COLONOSCOPY WITH PROPOFOL N/A 07/24/2016   Rourk: 3 tubular adenomas removed.  Diverticulosis.  3-year surveillance colonoscopy recommended.  . COLONOSCOPY WITH PROPOFOL N/A 12/15/2019   Procedure: COLONOSCOPY WITH PROPOFOL;  Surgeon: Daneil Dolin, MD;  [facility];  Service: Endoscopy;  Laterality: N/A;  8:30am  . CORONARY ANGIOPLASTY WITH STENT PLACEMENT  2005  . CORONARY STENT INTERVENTION N/A 04/12/2020   Procedure: CORONARY STENT INTERVENTION;  Surgeon: Sherren Mocha, MD;  Location: [facility];  Service: Cardiovascular;  Laterality: N/A;  . CORONARY STENT PLACEMENT  03/30/2015   RCA    DES  . EYE SURGERY N/A    Phreesia 01/11/2020  . HERNIA REPAIR    . INTRAVASCULAR IMAGING/OCT N/A 04/12/2020   Procedure: INTRAVASCULAR IMAGING/OCT;  Surgeon: Sherren Mocha, MD;  Location: [facility];  Service: Cardiovascular;  Laterality: N/A;  . KNEE ARTHROSCOPY Left 2012  . LEFT HEART CATH AND CORONARY ANGIOGRAPHY N/A 04/12/2020   Procedure: LEFT HEART CATH AND CORONARY ANGIOGRAPHY;  Surgeon: Sherren Mocha, MD;  Location: [facility];  Service: Cardiovascular;  Laterality: N/A;  . [place]   "herniated disc"  . POLYPECTOMY  07/24/2016   Procedure: POLYPECTOMY;  Surgeon: Daneil Dolin, MD;  [facility];  Service: Endoscopy;;  ascending colon  polyp, splenic flexure polyps times 2  . POLYPECTOMY  12/15/2019   Procedure: POLYPECTOMY;  Surgeon: Daneil Dolin, MD;  [facility];  Service: Endoscopy;;  . UMBILICAL HERNIA REPAIR  1990s  . YAG LASER APPLICATION Right 93/81/8299   Procedure: YAG LASER APPLICATION;  Surgeon: Elta Guadeloupe [name], MD;  Location: [facility];  Service: Ophthalmology;  Laterality: Right;     Social History:  The patient  reports that he quit smoking about 16 years ago. His smoking use included cigarettes. He has a 1.00 pack-year smoking history. He has never used smokeless tobacco. He reports that he does not drink alcohol and does not use drugs.  Family History:  The patient's family history includes Colon cancer in his mother.    ROS:  Please see the history of present illness. All other systems are reviewed and  Negative to the above problem except as noted.    PHYSICAL EXAM: VS:  BP 130/82   Pulse 71   Ht 5' 8.5" (1.74 m)   Wt (!) 303 lb (137.4 kg)   SpO2 95%   BMI 45.40 kg/m   GEN:  Mobridly obese 75 yo in no acute distress  HEENT: normal  Neck: no JVD, carotid bruits Cardiac: RRR; no murmurs  No LE  edema   R wrist with minimal tenderness   No swelling  Respiratory:  clear to auscultation bilaterally, normal work of breathing GI: Obese  nontender   MS: no deformity Moving all extremities   Skin: warm and dry, no rash Neuro:  Strength and sensation are intact Psych: euthymic mood, full affect   EKG:  EKG is not ordered today.  I  CATH   March 2022   CARDIAC CATH: 04/12/2020 1. Patent stents in the LAD and RCA with mild diffuse in-stent restneosis 2. Severe eccentric stenosis of the proximal LAD, treated with PCI using a 4.0x22 mm Resolute Onyx DES under OCT guidance.  3. Moderately elevated LVEDP  Recommend: ASA and clopidogrel at least 12 months without interruption, aggressive medical therapy. Favor long-term DAPT if tolerated. OK for discharge tomorrow if no complicating  features.  Left Anterior Descending  The vessel exhibits minimal luminal irregularities.  Collaterals  Dist LAD filled by collaterals from 1st Diag.    Ost LAD to Prox LAD lesion is 90% stenosed. The lesion is eccentric.  Mid LAD to Dist LAD lesion is 25% stenosed. The lesion was previously treated using a drug eluting stent over 2 years ago. Mild diffuse ISR without any flow-limiting lesions identified.  Previously placed Dist LAD drug eluting stent is widely patent.  Left Circumflex  The vessel exhibits minimal luminal irregularities.  Right Coronary Artery  The vessel exhibits minimal luminal irregularities.  Dist RCA-1 lesion is 25% stenosed. The lesion was previously treatedover 2 years ago.  Non-stenotic Dist RCA-2 lesion was previously treated. The lesion is type C and located at the major branch.  Intervention     Lipid Panel    Component Value Date/Time   CHOL 148 04/12/2020 1744   CHOL 151 02/11/2020 1432   TRIG 117 04/12/2020 1744   HDL 40 (L) 04/12/2020 1744   HDL 45 02/11/2020 1432   CHOLHDL 3.7 04/12/2020 1744   VLDL 23 04/12/2020 1744   LDLCALC 85 04/12/2020 1744   LDLCALC 84 02/11/2020 1432      Wt Readings from Last 3 Encounters:  04/20/20 (!) 303 lb (137.4 kg)  04/13/20 (!) 323 lb 6.6 oz (146.7 kg)  01/12/20 (!) 311 lb (141.1 kg)      ASSESSMENT AND PLAN:  1  CAD Pt with recent admit and intervention to the LAD   Doing better   Keep  On current regimen   Plan for f/u later this year   2  HL   Now on higher dose of statin    Will get lipids in May    3  HTN  Good control Keep on current regimen   4  DM  Pts daughter is cooking for him    REviewed   She will look at Ensure   Consider TRE to lose wt    Skip /delay breakfast/brunch  Watch foods with high AGE.    She will reivew   5  Morbid obesity  Diet changes as noted above   Plan for f/u later this year      Current medicines are reviewed at length with the patient today.  The patient does  not have concerns regarding medicines.  Signed, [name], MD  04/20/2020 1:05 PM    [facility] [address], [place], [place]  [address] Phone: [phone]; Fax: [phone]

## 2020-04-20 ENCOUNTER — Ambulatory Visit: Payer: Medicare Other | Admitting: Internal Medicine

## 2020-04-20 ENCOUNTER — Encounter: Payer: Self-pay | Admitting: Internal Medicine

## 2020-04-20 ENCOUNTER — Other Ambulatory Visit: Payer: Self-pay

## 2020-04-20 VITALS — BP 130/82 | HR 71 | Ht 68.5 in | Wt 303.0 lb

## 2020-04-20 DIAGNOSIS — E785 Hyperlipidemia, unspecified: Secondary | ICD-10-CM | POA: Diagnosis not present

## 2020-04-20 NOTE — Patient Instructions (Signed)
Medication Instructions:  Your physician recommends that you continue on your current medications as directed. Please refer to the Current Medication list given to you today.  *If you need a refill on your cardiac medications before your next appointment, please call your pharmacy*   Lab Work: Your physician recommends that you return for lab work in: May   If you have labs (blood work) drawn today and your tests are completely normal, you will receive your results only by: Marland Kitchen MyChart Message (if you have MyChart) OR . A paper copy in the mail If you have any lab test that is abnormal or we need to change your treatment, we will call you to review the results.   Testing/Procedures: NONE    Follow-Up: At [facility], you and your health needs are our priority.  As part of our continuing mission to provide you with exceptional heart care, we have created designated Provider Care Teams.  These Care Teams include your primary Cardiologist (physician) and Advanced Practice Providers (APPs -  Physician Assistants and Nurse Practitioners) who all work together to provide you with the care you need, when you need it.  We recommend signing up for the patient portal called "MyChart".  Sign up information is provided on this After Visit Summary.  MyChart is used to connect with patients for Virtual Visits (Telemedicine).  Patients are able to view lab/test results, encounter notes, upcoming appointments, etc.  Non-urgent messages can be sent to your provider as well.   To learn more about what you can do with MyChart, go to [email or web address].    Your next appointment:   5 month(s)  The format for your next appointment:   In Person  Provider:   [name], MD   Other Instructions Thank you for choosing [facility]!

## 2020-04-23 ENCOUNTER — Ambulatory Visit (INDEPENDENT_AMBULATORY_CARE_PROVIDER_SITE_OTHER): Payer: Medicare Other | Admitting: Nurse Practitioner

## 2020-04-23 ENCOUNTER — Other Ambulatory Visit: Payer: Self-pay

## 2020-04-23 ENCOUNTER — Encounter: Payer: Self-pay | Admitting: Nurse Practitioner

## 2020-04-23 VITALS — BP 127/78 | HR 74 | Temp 98.7°F | Resp 20 | Ht 68.5 in | Wt 304.0 lb

## 2020-04-23 DIAGNOSIS — E785 Hyperlipidemia, unspecified: Secondary | ICD-10-CM | POA: Diagnosis not present

## 2020-04-23 DIAGNOSIS — E119 Type 2 diabetes mellitus without complications: Secondary | ICD-10-CM | POA: Diagnosis not present

## 2020-04-23 DIAGNOSIS — Z23 Encounter for immunization: Secondary | ICD-10-CM | POA: Diagnosis not present

## 2020-04-23 DIAGNOSIS — R001 Bradycardia, unspecified: Secondary | ICD-10-CM | POA: Diagnosis not present

## 2020-04-23 DIAGNOSIS — M545 Low back pain, unspecified: Secondary | ICD-10-CM | POA: Diagnosis not present

## 2020-04-23 DIAGNOSIS — M549 Dorsalgia, unspecified: Secondary | ICD-10-CM | POA: Insufficient documentation

## 2020-04-23 DIAGNOSIS — G8929 Other chronic pain: Secondary | ICD-10-CM | POA: Insufficient documentation

## 2020-04-23 DIAGNOSIS — I10 Essential (primary) hypertension: Secondary | ICD-10-CM

## 2020-04-23 MED ORDER — HYDROCODONE-ACETAMINOPHEN 10-325 MG PO TABS: 1.0000 | ORAL_TABLET | Freq: Four times a day (QID) | ORAL | 0 refills | Status: DC | PRN

## 2020-04-23 NOTE — Assessment & Plan Note (Signed)
Wt Readings from Last 3 Encounters:  04/23/20 (!) 304 lb (137.9 kg)  04/20/20 (!) 303 lb (137.4 kg)  04/13/20 (!) 323 lb 6.6 oz (146.7 kg)   His daughter has been cooking for him, and he has been eating healthier. -doing well with weight loss

## 2020-04-23 NOTE — Assessment & Plan Note (Signed)
-  will refill his hydrocodone at [facility] -was decreased to TID, and he is having more pain

## 2020-04-23 NOTE — Assessment & Plan Note (Addendum)
Lab Results  Component Value Date   CHOL 148 04/12/2020   HDL 40 (L) 04/12/2020   LDLCALC 85 04/12/2020   TRIG 117 04/12/2020   CHOLHDL 3.7 04/12/2020  -LDL goal < 70 -may consider repatha, zetia or nexletol with next lab draw if not at goal

## 2020-04-23 NOTE — Patient Instructions (Signed)
Please have fasting labs drawn 2-3 days prior to your appointment so we can discuss them together at your office visit.

## 2020-04-23 NOTE — Assessment & Plan Note (Addendum)
BP Readings from Last 3 Encounters:  04/23/20 (!) 176/81  04/20/20 130/82  04/13/20 (!) 160/78  -BP elevated today after ambulating; BP returned to normal at rest with BP 127/78 manually -takes metoprolol, losartan, terazosin, and PRN nitro

## 2020-04-23 NOTE — Addendum Note (Signed)
Addended by: Lonn Georgia on: 04/23/2020 11:10 AM   Modules accepted: Orders

## 2020-04-23 NOTE — Progress Notes (Signed)
Established Patient Office Visit  Subjective:  Patient ID: Steven Stevens, male    DOB: 26-Oct-1945  Age: 75 y.o. MRN: [id number]  CC:  Chief Complaint  Patient presents with  . Transitions Of Care    Recent MI.     HPI Steven Stevens presents for hospital follow-up. He was admitted to the hospital from 3/7-04/13/20 for NSTEMI. He had 04/12/20 heart cath with severe proximal LAD stenosis that was treated with PCI.  He is on ASA and plavix, long-term.  He had follow-up with Dr. Harrington Challenger on 04/20/20. Recommended intermittent fasting.  He is having back pain after his pain meds were reduced to TID from [facility].  Past Medical History:  Diagnosis Date  . Anxiety   . Chronic lower back pain   . Coronary artery disease    DES to RCA 2005, DES to RCA 2017, DES x2 to CTO LAD 2017  . Depression   . Hyperlipidemia   . Hypertension   . Myocardial infarct ([place]) 01/27/2004  . Type 2 diabetes mellitus ([place])   . Type II diabetes mellitus ([place])     Past Surgical History:  Procedure Laterality Date  . BACK SURGERY    . CARDIAC CATHETERIZATION N/A 03/30/2015   Procedure: Left Heart Cath and Coronary Angiography;  Surgeon: Jettie Booze, MD;  Location: [facility];  Service: Cardiovascular;  Laterality: N/A;  . CARDIAC CATHETERIZATION  03/30/2015   Procedure: Coronary Stent Intervention;  Surgeon: Jettie Booze, MD;  Location: [facility];  Service: Cardiovascular;;  . CARDIAC CATHETERIZATION N/A 04/14/2015   Procedure: Coronary/Bypass Graft CTO Intervention;  Surgeon: Jettie Booze, MD;  Location: [facility];  Service: Cardiovascular;  Laterality: N/A;  . CARDIAC CATHETERIZATION  04/14/2015   Procedure: Left Heart Cath and Coronary Angiography;  Surgeon: Jettie Booze, MD;  Location: [facility];  Service: Cardiovascular;;  . CATARACT EXTRACTION W/PHACO Right 12/03/2012   Procedure: CATARACT EXTRACTION RIGHT EYE (WITH PHACO) AND INTRAOCULAR LENS  PLACEMENT  CDE=2.41;  Surgeon: Elta Guadeloupe [name], MD;  [facility];  Service: Ophthalmology;  Laterality: Right;  . CATARACT EXTRACTION W/PHACO Left 12/17/2012   Procedure: CATARACT EXTRACTION PHACO AND INTRAOCULAR LENS PLACEMENT (IOC);  Surgeon: Elta Guadeloupe [name], MD;  Location: [facility];  Service: Ophthalmology;  Laterality: Left;  CDE:7.82  . COLON SURGERY N/A    Phreesia 01/11/2020  . COLONOSCOPY  2011   Dr. Gala Romney: diverticulosis, tubular adenomas  . COLONOSCOPY WITH PROPOFOL N/A 07/24/2016   Rourk: 3 tubular adenomas removed.  Diverticulosis.  3-year surveillance colonoscopy recommended.  . COLONOSCOPY WITH PROPOFOL N/A 12/15/2019   Procedure: COLONOSCOPY WITH PROPOFOL;  Surgeon: Daneil Dolin, MD;  [facility];  Service: Endoscopy;  Laterality: N/A;  8:30am  . CORONARY ANGIOPLASTY WITH STENT PLACEMENT  2005  . CORONARY STENT INTERVENTION N/A 04/12/2020   Procedure: CORONARY STENT INTERVENTION;  Surgeon: Sherren Mocha, MD;  Location: [facility];  Service: Cardiovascular;  Laterality: N/A;  . CORONARY STENT PLACEMENT  03/30/2015   RCA    DES  . EYE SURGERY N/A    Phreesia 01/11/2020  . HERNIA REPAIR    . INTRAVASCULAR IMAGING/OCT N/A 04/12/2020   Procedure: INTRAVASCULAR IMAGING/OCT;  Surgeon: Sherren Mocha, MD;  Location: [facility];  Service: Cardiovascular;  Laterality: N/A;  . KNEE ARTHROSCOPY Left 2012  . LEFT HEART CATH AND CORONARY ANGIOGRAPHY N/A 04/12/2020   Procedure: LEFT HEART CATH AND CORONARY ANGIOGRAPHY;  Surgeon: Sherren Mocha, MD;  Location: [facility];  Service: Cardiovascular;  Laterality: N/A;  . [place]   "herniated disc"  . POLYPECTOMY  07/24/2016   Procedure: POLYPECTOMY;  Surgeon: Daneil Dolin, MD;  [facility];  Service: Endoscopy;;  ascending colon polyp, splenic flexure polyps times 2  . POLYPECTOMY  12/15/2019   Procedure: POLYPECTOMY;  Surgeon: Daneil Dolin, MD;  [facility];   Service: Endoscopy;;  . UMBILICAL HERNIA REPAIR  1990s  . YAG LASER APPLICATION Right 43/15/4008   Procedure: YAG LASER APPLICATION;  Surgeon: Elta Guadeloupe [name], MD;  Location: [facility];  Service: Ophthalmology;  Laterality: Right;    Family History  Problem Relation Age of Onset  . Colon cancer Mother        deceased age 18, diagnosed with CRC in 57s.    Social History   Socioeconomic History  . Marital status: Widowed    Spouse name: Not on file  . Number of children: Not on file  . Years of education: Not on file  . Highest education level: Not on file  Occupational History  . Not on file  Tobacco Use  . Smoking status: Former Smoker    Packs/day: 0.10    Years: 10.00    Pack years: 1.00    Types: Cigarettes    Quit date: 01/31/2004    Years since quitting: 16.2  . Smokeless tobacco: Never Used  Vaping Use  . Vaping Use: Never used  Substance and Sexual Activity  . Alcohol use: No    Alcohol/week: 0.0 standard drinks  . Drug use: No  . Sexual activity: Never  Other Topics Concern  . Not on file  Social History Narrative  . Not on file   Social Determinants of Health   Financial Resource Strain: Not on file  Food Insecurity: Not on file  Transportation Needs: Not on file  Physical Activity: Not on file  Stress: Not on file  Social Connections: Not on file  Intimate Partner Violence: Not on file    Outpatient Medications Prior to Visit  Medication Sig Dispense Refill  . acetaminophen (TYLENOL) 500 MG tablet Take 250 mg by mouth daily.    Marland Kitchen ALPRAZolam (XANAX) 0.5 MG tablet Take 1 tablet (0.5 mg total) by mouth in the morning, at noon, and at bedtime. 90 tablet 2  . aspirin EC 81 MG tablet Take 81 mg by mouth daily.    Marland Kitchen atorvastatin (LIPITOR) 80 MG tablet Take 1 tablet (80 mg total) by mouth daily. 30 tablet 6  . clopidogrel (PLAVIX) 75 MG tablet Take 1 tablet (75 mg total) by mouth daily. (Patient taking differently: Take 75 mg by mouth daily in the  afternoon.) 30 tablet 11  . escitalopram (LEXAPRO) 10 MG tablet Take 1 tablet (10 mg total) by mouth daily in the afternoon. 30 tablet 3  . glipiZIDE-metformin (METAGLIP) 2.5-500 MG tablet TAKE 2 TABLETS BY MOUTH IN THE MORNING AND 1 TABLET AT NIGHT 270 tablet 1  . hydrocortisone (ANUSOL-HC) 2.5 % rectal cream Place 1 application rectally 4 (four) times daily as needed (hemorrhoids/discomfort).     Marland Kitchen LINZESS 145 MCG CAPS capsule Take 145 mcg by mouth daily as needed (constipation.).     Marland Kitchen losartan (COZAAR) 25 MG tablet Take 1 tablet (25 mg total) by mouth daily. 90 tablet 0  . metoprolol tartrate (LOPRESSOR) 25 MG tablet Take 12.5 mg by mouth 2 (two) times daily.    Marland Kitchen  Misc Natural Products (NEURIVA PO) Take 1 tablet by mouth daily in the afternoon.    . nitroGLYCERIN (NITROSTAT) 0.4 MG SL tablet Place 1 tablet (0.4 mg total) under the tongue every 5 (five) minutes x 3 doses as needed for chest pain. 25 tablet 3  . terazosin (HYTRIN) 5 MG capsule Take 1 capsule (5 mg total) by mouth daily in the afternoon. 90 capsule 1  . traZODone (DESYREL) 50 MG tablet Take 1 tablet (50 mg total) by mouth at bedtime. 90 tablet 1  . HYDROcodone-acetaminophen (NORCO) 10-325 MG tablet Take 1 tablet by mouth every 8 (eight) hours as needed for moderate pain. 90 tablet 0   No facility-administered medications prior to visit.    No Known Allergies  ROS Review of Systems  Constitutional: Negative.   Respiratory: Negative.   Cardiovascular: Negative.   Musculoskeletal: Positive for back pain.  Psychiatric/Behavioral: Negative.       Objective:    Physical Exam Constitutional:      Appearance: He is obese.  Cardiovascular:     Rate and Rhythm: Normal rate and regular rhythm.     Pulses: Normal pulses.     Heart sounds: Normal heart sounds.  Pulmonary:     Effort: Pulmonary effort is normal.     Breath sounds: Normal breath sounds.  Musculoskeletal:     Comments: Uses cane for ambulation   Neurological:     Mental Status: He is alert.     BP 127/78   Pulse 74   Temp 98.7 F (37.1 C)   Resp 20   Ht 5' 8.5" (1.74 m)   Wt (!) 304 lb (137.9 kg)   SpO2 91%   BMI 45.55 kg/m  Wt Readings from Last 3 Encounters:  04/23/20 (!) 304 lb (137.9 kg)  04/20/20 (!) 303 lb (137.4 kg)  04/13/20 (!) 323 lb 6.6 oz (146.7 kg)     Health Maintenance Due  Topic Date Due  . COVID-19 Vaccine (1) Never done  . FOOT EXAM  Never done  . OPHTHALMOLOGY EXAM  Never done  . PNA vac Low Risk Adult (1 of 2 - PCV13) Never done  . INFLUENZA VACCINE  09/07/2019    There are no preventive care reminders to display for this patient.  Lab Results  Component Value Date   TSH 1.966 04/12/2020   Lab Results  Component Value Date   WBC 9.1 04/13/2020   HGB 11.5 (L) 04/13/2020   HCT 34.0 (L) 04/13/2020   MCV 91.9 04/13/2020   PLT 210 04/13/2020   Lab Results  Component Value Date   NA 137 04/13/2020   K 3.7 04/13/2020   CO2 27 04/13/2020   GLUCOSE 113 (H) 04/13/2020   BUN 11 04/13/2020   CREATININE 1.19 04/13/2020   BILITOT 0.9 04/13/2020   ALKPHOS 25 (L) 04/13/2020   AST 36 04/13/2020   ALT 26 04/13/2020   PROT 5.8 (L) 04/13/2020   ALBUMIN 3.2 (L) 04/13/2020   CALCIUM 8.7 (L) 04/13/2020   ANIONGAP 8 04/13/2020   Lab Results  Component Value Date   CHOL 148 04/12/2020   Lab Results  Component Value Date   HDL 40 (L) 04/12/2020   Lab Results  Component Value Date   LDLCALC 85 04/12/2020   Lab Results  Component Value Date   TRIG 117 04/12/2020   Lab Results  Component Value Date   CHOLHDL 3.7 04/12/2020   Lab Results  Component Value Date   HGBA1C 7.0 (H) 04/12/2020  Assessment & Plan:   Problem List Items Addressed This Visit      Cardiovascular and Mediastinum   Essential hypertension    BP Readings from Last 3 Encounters:  04/23/20 (!) 176/81  04/20/20 130/82  04/13/20 (!) 160/78  -BP elevated today after ambulating; BP returned to normal at  rest with BP 127/78 manually -takes metoprolol, losartan, terazosin, and PRN nitro        Endocrine   Type 2 diabetes mellitus treated without insulin (HCC)    -A1c was 7.0 -no change to meds today        Other   Dyslipidemia    Lab Results  Component Value Date   CHOL 148 04/12/2020   HDL 40 (L) 04/12/2020   LDLCALC 85 04/12/2020   TRIG 117 04/12/2020   CHOLHDL 3.7 04/12/2020  -LDL goal < 70 -may consider repatha, zetia or nexletol with next lab draw if not at goal       Morbid obesity ([place])    Wt Readings from Last 3 Encounters:  04/23/20 (!) 304 lb (137.9 kg)  04/20/20 (!) 303 lb (137.4 kg)  04/13/20 (!) 323 lb 6.6 oz (146.7 kg)   His daughter has been cooking for him, and he has been eating healthier. -doing well with weight loss      Bradycardia    -resolved; HR 74 today      Chronic back pain    -will refill his hydrocodone at [facility] -was decreased to TID, and he is having more pain      Relevant Medications   HYDROcodone-acetaminophen (NORCO) 10-325 MG tablet (Start on 05/09/2020)      Meds ordered this encounter  Medications  . HYDROcodone-acetaminophen (NORCO) 10-325 MG tablet    Sig: Take 1 tablet by mouth every 6 (six) hours as needed for moderate pain.    Dispense:  120 tablet    Refill:  0    Don't fill until 05/09/20    Follow-up: Return in about 3 months (around 07/24/2020) for Lab follow-up.    [name], NP

## 2020-04-23 NOTE — Assessment & Plan Note (Signed)
-  A1c was 7.0 -no change to meds today

## 2020-04-23 NOTE — Assessment & Plan Note (Signed)
-  resolved; HR 74 today

## 2020-04-26 ENCOUNTER — Encounter: Payer: Self-pay | Admitting: *Deleted

## 2020-04-26 ENCOUNTER — Encounter: Payer: Medicare Other | Admitting: *Deleted

## 2020-04-26 DIAGNOSIS — Z006 Encounter for examination for normal comparison and control in clinical research program: Secondary | ICD-10-CM

## 2020-04-26 MED ORDER — STUDY - SOS-AMI - PLACEBO FOR SELATOGREL 0 MG/0.5 ML SQ INJECTION FOR SCREENING USE ONLY (PI-CHRISTOPHER): 0.5000 mL | INJECTION | Freq: Once | SUBCUTANEOUS | 0 refills | Status: DC

## 2020-04-26 MED ORDER — STUDY - SOS-AMI - PLACEBO FOR SELATOGREL 0 MG/0.5 ML SQ INJECTION FOR SCREENING USE ONLY (PI-CHRISTOPHER)
0.5000 mL | INJECTION | Freq: Once | SUBCUTANEOUS | 0 refills | Status: DC
Start: 2020-04-26 — End: 2020-04-26

## 2020-04-26 MED ORDER — STUDY - SOS-AMI - SELATOGREL 16 MG/0.5 ML OR PLACEBO SQ INJECTION (PI-CHRISTOPHER): 16.0000 mg | INJECTION | Freq: Once | SUBCUTANEOUS | 0 refills | Status: DC

## 2020-04-26 NOTE — Research (Signed)
[id number],  [id number]   Subject ID: [id number] [name], RN, BSN Trainer's signature:  on file- Delegation of Authority Log Date of visit:  26-Apr-2020  SOS-AMI   VISIT 1   1. Were body weight and/or height asessed?   []  No   [x]   Yes  2. Date  ___21-MAR-2022  3. Height    __68             4.  Units     []   cm    [x]   inches  5. Height (cm calculated)  inches x 2.54 174__ cm  6. Weight   _299_______          7.  Units     []   kg     [x]   lbs  8. Weight (kg calculated)    weight lbs x 0.4536   _136_ kg  9. BMI (kg/m2)  ___45.55______   Physical examination  1. Was the physical examination performed?     []   No  [x]   Yes  2.  Date     _21-MAR-2022 Medical History  1 Any clinically significant past and/or concomitant diseases or past procedures?  []  No  [x]  Yes     Main disease or procedure        Start date         End date      Ongoing at          Informed consent ?  Cardiac Catheterization  01-28-2004 01-28-2004 no  Cardiac catheterization 03-11-2015 03-11-2015 no  CTO intervention, cardiac catheterization 04-14-2015 04-14-2015 no  Cardiac Catheterization 04-12-2020 03.-08-2020 no       . Add more lines as needed.   Most recent LVEF & Procedures Qualifying AMI  1. Most recent LVEF      1.1  Assessment date    ___08-MAR-2022_  DD-MMM-YYYY       1.2  Left Ventricular Ejection Fraction (%) []   < 35   []   35-50  [x]   >50  2.  Procedures Qualifying AMI      2.1  Coronary- angiography   []   No  [x]   Yes      2.2  Percutaneous coronary intervention []   No  [x]   Yes      2.3  Coronary Artery Bypass Grafting  [x]   No  []   Yes      2.4  Stent insertion    []   No  [x]   Yes              2.4.1.   BMS?       [x]  No  []   yes, Number of BMS  ___________              2.4.2.  DES?        []   No  [x]   yes, Number of DES  ___1______   2.4.3 BVS?      [x]   No  []   yes, Number of BVS  ___________  Vital Signs  1. Were vital signs  collected?  []   No   [x]   Yes  1.1  Date      _21-MAR-2022__  DD-MMM-YYYY  1.2  Time     _1130______ 24 hour clock  1.3  Heart Rate (bpm)  _80_______               1.4  Systolic Blood Pressure (mmHg) __128_____  1.5  Diastolic Blood Pressure (mmHg)  __78_____  Pregnancy Test  2. Was a pregnancy test performed?  []   No  []   yes  [x]   N/A     2.1  Test Date   ___________ DD- MMM-YYY   FRAILTY CHARACTERISTICS   Did any of the following occure within the last 12 months?  4.1  Unintentional weight loss greater than or equal to 5 kg(10 lbs)    [x]  No  []  Yes 4.2  Decrease grip strength [x]  No  []  Yes 4.3 Increased fatigue/ lethargy or decrease in endurance   [x]  No  []   Yes 4.4   Decrease in pace when walking a distance of 5 meters (15 feet) [x]   No  []   Yes 4.5 Decline in typical physical activity level  [x]   No  []   Yes  Form based on [employer] eCRF  Version 5.0 - 03-Dec-2019 pg 10-15      [id number], SOS-AMI  Subject ID: [id number] [name], RN, BSN Trainer's signature:  on file- Delegation of Authority Log   SCREENING: PATIENT TRAINING- DEMONSTRATION DEVICE   2. Was the training delivered?   []  NO  [x]  YES        1.1 Date of training:  _21-MAR-2022______   dd/mmm/yyyy     1.2 Start time: _1130___  24 hour clock  1.3 Stop time: __1145__  24 hour clock     Difficulties in using the autoinjector for the DEMONSTRATION DEVICE self-injection:  3. Were there any difficulties in performing the placebo self-injection?  [x]  NO  []  YES  IF YES, please answering the following:   Difficulties with Step1: Choose an injection site?  3.1.1. Was the injection site as defined in the protocol (abdomen/thigh)? []  NO  []   YES 3.1.2 Was the injection done on bare skin? []  NO  []   YES 3.1.3 Did the subject report any other difficulties in choosing injection site? []  NO  []  YES                 If YES, Please specify:  ______________________  Difficulties with Step 2: Twist cap off?  3.2.1  Did the subject twist the cap to remove it? []  NO  []   YES 3.2.2    Did the subject twist the cap counterclockwise? []   NO  []   YES 3.2.3   Did the force/ torque applied sufficient to twist the cap off?  []   NO  []   YES 3.2.4 Did the subject report any other difficulties in twisting the cap off? []   NO  []  YES                If YES, please specify:___________________  Difficulties with Step 3: Pinch skin and place the autoinjector:  3.3.1  Did the subject pinch skin at injection site? []   NO  []   YES 3.3.2 Did the subject place the autoinjector perpendicular to the skin? []   NO  []   YES 3.3.3 Did the subject report any other difficulties pinching the skin and placing                  the autoinejctor?  []   NO  []   YES  Difficulties with Step 4: Firmly puch down and hold for 3 seconds  3.4.1 Did the subject attempt to inject with the autoinjector in the right position,      i.e needle end down?  []   NO  []   YES  3.4.2 Did the subject push down firmly until it clicked? []  NO  []   YES 3.4.3 Did the subject hold the autoinjector for about 3 seconds or until the        Viewing window turned orange?  []   NO  []  YES 3.4.4 Did the subject report any other difficulties pushing firmly down? []  NO  []   YES     If YES, please specify: _________________  [employer]   SOS-AMI_CRF_Version 5.0_27OCT2021  pgs 6,7    Patient Training & Self-injection of [place]ACE[place]   1. Was the training delivered?  []   No  [x]   Yes      1.1  Date of training    06-May-2020      1.2  Start time       ___1151__ (24 hour clock)      1.3  Stop time       ___1215__ (24 hour clock)       2.  Did the placebo self-injection occur?     []   No  [x]   Yes      2.1 Autoinjector ID / Label    _[id number]_________       Difficulties in using the autoinjector for the placebo self-injection    3.  Were there any difficulties in performing the placebo  self-injection [x]   No  []   Yes       IF NO, DO NOT ANSWER THE FOLLOWING QUESTIONS  Difficulties with Step 1: Choose an injection site     3.1.1  Was the injection site as defined in the protocol (abdomen / thigh)?  []   No  []   Yes     3.1.2. Was the injection done on bare skin?  []   No  []   Yes     3.1.3  Did the subject report any other difficulties choosing                      injection site ?  []   No  []   Yes, please specify _____________________  Difficulties with Step 2: Twist cap off     3.2.1  Did the subject twist the cap to remove it?  []   No  []   Yes    3.2.2  Did the subject twist the cap counterclockwise? []   No  []   Yes    3.2.3  Did the force/ torque applied sufficient to twist the cap off?   []   No  []   Yes    3.2.4  Did the subject report any other difficulties in twisting                the cap off?  []   No  []   Yes, please specify ________________________________  Difficulties with Step 3: Pinch skin and place the autoinjector    3.3.1  Did the subject pinch skin at injection site?  []   No  []   Yes    3.3.2  Did the subject place the autoinjector perpendicularly to the skin? []   No  []   Yes    3.3.3  Did the subject report any other difficulties pinching the skin                and placing the autoinjector?    []   No  []   Yes, please specify ____________________  Difficulties with Step 4: Firmly push down and hold for 3 seconds    3.4.1  Did the subject attempt to inject with the autoinjector  in the right                position, I.e. needle end down?   []  No  []   Yes    3.4.2  Did the subject push down firmly until it clicked? []   No  []   Yes    3.4.3  Did the subject hold the autoinjector for about 3 seconds                 or until the viewing window turned orange?  []   No  []   Yes    3.4.4  Did the subject report any other difficulties pushing                 firmly down ?  []   No  []   Yes, Please specify __________________________    INITIAL PATIENT  TRAINING  Q1, Did someone (e.g. caregiver, family member) attend the training session             together with the patient?    []  No  [x]   Yes        If yes, please note name phone number and address of other person: ____Patient's 2 daughters;  Steven Stevens [phone]  And Steven Stevens  [phone]__________________________________________  Q2. Was the following information provided to the subject?             [x]   Heart attack symptoms   [x]   How to act (inject and follow-up actions to be taken)  [x]   Use of the demo device, including label instructions and IFU  [x]   How to perform the placebo self-injection   Q3  Did the subject correctly reply to the wrap-up questions?   A.  What are common heart attack symptoms?    []   No  [x]   Yes             B.  What has to be done in case any of those symptoms occurs?  []  No  [x]  Yes       Make note of any misconceptions and clarifications given: ________none________________________________________________________________  Q4  Where will the subject keep/store the study autoinjectors? ___Patient will keep 1 autoinjector in his pocket and his Daughter Steven Stevens will keep the other in her purse.  Patient lives with this daughter.  ________________________________________________________________________              Form based on [employer] SOS-AMI_CRF Version 5.0 - 27OCT2021 PGS 6, 7, 73      PATIENT AND BOTH DAUGHTERS VOICED UNDERSTANDING OF ALL AUTOINJECTOR TRAINING. ALL QUESTIONS WERE ASKED AND ANSWERED TO EVERYONE'S SATISFACTION.   PATIENT LEFT THE RESEARCH OFFICE AT 1320 WITH THE FOLLOWING AUTOINJECTORS.   DISPENSED AUTOINJECTOR 1  ID# [id number] DISPENSED AUTOINJECTOR 2  ID# [id number]

## 2020-04-26 NOTE — Research (Signed)
Patient here for evaluation of SOS AMI trial.    ID- [id number],  SOS-AMI  SUBJECT INFORMED CONSENT PROCESS   Subject Consented by:   []   Principal Investigator:  Buford Dresser, MD [x]   Sub-Investigator: Bing Quarry, MD  Consenting Process:   [x]   The above named investigator explained the study and SIL-ICF, in their entirety,           to the Subject and Any / All third parties deemed necessary by the subject.  [x]   All medical questions, pertaining to the Study, were asked and fully answered.    [x]   Subject voiced understanding of the Informed Consent contents and is        agreeable to proceed with the SOS-AMI study.   Both the Subject and Investigator signed the SIL-ICF on:    Date of Consent: ___03/21/2022_________   (DD-MMM-YYYY) Time of Consent: ____11:25 am_________  (24 hour clock)  [x]  No study related assessment or testing has been performed prior to the        Subject's ICF signature.   [x]  A signed copy of the SIL-ICF was given to the subject/ legal representative,         as applicable.   Investigator Comments:   Patient seen and examined.  Informed consent provide to patient.  Patient meets the inclusion and exclusion criteria for SOS AMI.    He has read and understands the consent and questions answered.  He has been trained as has family members in the appropriate use and proper technique of self injection for the agent.  Risks and benefits have been explained.   He is Killip Class I   Exam  VS stable.  Lungs clear. Cardiac rhythm regular, no murmurs.  Abd large but no obvious masses. Ext trace edema (imiproved per patient)  Loretha Brasil. Lia Foyer, MD, [facility], [place] Director, [facility]      **Form based on [facility] [id number] Version5.0-27OCT2021  page 3

## 2020-04-26 NOTE — Research (Unsigned)
SOS-AMI AUTOINJECTOR TRAINING Vital signs  BP 128/78  Pulse 80 bpm  Weight 136 kg (299 lbs)  Height 174 cm (5'8.5")    Current Outpatient Medications:  .  acetaminophen (TYLENOL) 500 MG tablet, Take 250 mg by mouth daily., Disp: , Rfl:  .  ALPRAZolam (XANAX) 0.5 MG tablet, Take 1 tablet (0.5 mg total) by mouth in the morning, at noon, and at bedtime., Disp: 90 tablet, Rfl: 2 .  aspirin EC 81 MG tablet, Take 81 mg by mouth daily., Disp: , Rfl:  .  atorvastatin (LIPITOR) 80 MG tablet, Take 1 tablet (80 mg total) by mouth daily., Disp: 30 tablet, Rfl: 6 .  clopidogrel (PLAVIX) 75 MG tablet, Take 1 tablet (75 mg total) by mouth daily. (Patient taking differently: Take 75 mg by mouth daily in the afternoon.), Disp: 30 tablet, Rfl: 11 .  escitalopram (LEXAPRO) 10 MG tablet, Take 1 tablet (10 mg total) by mouth daily in the afternoon., Disp: 30 tablet, Rfl: 3 .  glipiZIDE-metformin (METAGLIP) 2.5-500 MG tablet, TAKE 2 TABLETS BY MOUTH IN THE MORNING AND 1 TABLET AT NIGHT, Disp: 270 tablet, Rfl: 1 .  [START ON 05/09/2020] HYDROcodone-acetaminophen (NORCO) 10-325 MG tablet, Take 1 tablet by mouth every 6 (six) hours as needed for moderate pain., Disp: 120 tablet, Rfl: 0 .  hydrocortisone (ANUSOL-HC) 2.5 % rectal cream, Place 1 application rectally 4 (four) times daily as needed (hemorrhoids/discomfort). , Disp: , Rfl:  .  LINZESS 145 MCG CAPS capsule, Take 145 mcg by mouth daily as needed (constipation.). , Disp: , Rfl:  .  losartan (COZAAR) 25 MG tablet, Take 1 tablet (25 mg total) by mouth daily., Disp: 90 tablet, Rfl: 0 .  metoprolol tartrate (LOPRESSOR) 25 MG tablet, Take 12.5 mg by mouth 2 (two) times daily., Disp: , Rfl:  .  Misc Natural Products (NEURIVA PO), Take 1 tablet by mouth daily in the afternoon., Disp: , Rfl:  .  nitroGLYCERIN (NITROSTAT) 0.4 MG SL tablet, Place 1 tablet (0.4 mg total) under the tongue every 5 (five) minutes x 3 doses as needed for chest pain., Disp: 25 tablet, Rfl: 3 .   Study - SOS-AMI - selatogrel 16 mg/0.5 mL or placebo SQ injection (PI-Christopher), Inject 0.5 mLs (16 mg total) into the skin once for 1 dose. Inject 0.5 mL subcutaneously in the abdomen or thigh if you experience  symptoms of a heart attack. Call 911 immediately  and seek emergency medical support. (Patient taking differently: Inject 16 mg into the skin as needed. Inject 0.5 mL subcutaneously in the abdomen or thigh if you experience  symptoms of a heart attack. Call 911 immediately  and seek emergency medical support.), Disp: 0.5 mL, Rfl: 0 .  terazosin (HYTRIN) 5 MG capsule, Take 1 capsule (5 mg total) by mouth daily in the afternoon., Disp: 90 capsule, Rfl: 1 .  traZODone (DESYREL) 50 MG tablet, Take 1 tablet (50 mg total) by mouth at bedtime., Disp: 90 tablet, Rfl: 1

## 2020-04-26 NOTE — Research (Signed)
ID- [id number],  SOS-AMI  Subject ID: [id number] Trainer's name: Dorene Sorrow McChesney Trainer's signature:  on file- Delegation of Authority Log Date of visit:                             26-Apr-2020   SOS-AMI Study: Screening  Date of Visit:  26-Apr-2020 Subject Informed Consent: 1. Date of informed consent:   26-Apr-2020 2. Time of informed consent: __1125______ (24 hour clock) 3. Protocol version    _4.0___ 4. Local protocol version (if applicable) _____  Demographics: . Listed in Epic   Baseline Risk Factors:  1. Second prior AMI within 1 year of screening  [x]   No  []   Yes  2. Second prior AMI more than 1 year before screening []   No  [x]   Yes  3. Diabetes mellitus defined by ongoing glucose lowering treatment []   No  [x]  Yes  4. Chronic kidney disease with estimated glomerular filtration               Rate <60 mL/min/1.73 m2                [x]   No  []   Yes  5.   Multivessel CAD >/= 50% stenosis in at least 2 coronary territories []   No  [x]   Yes  6.   PAD defined as any of the following: Ankle/brachial index < 0.85             Amputation, peripheral bypass, or peripheral angioplasty            of the extremities secondary to ischemia.   [x]   No   []   Yes  7.  Absence of coronary revascularization of the qualifying AMI          Referred to in inclusion criterion: [x]  No  []   Yes  8. Active daily smoking at screening:  [x]   No  []   Yes      P1. Qualifying AMI Characteristics     1.1  Diagnosis Date          12-Apr-2020          1.2  Diagnosis    []  STEMI  [x]   NSTEMI  []   Other           If other, please specify ___________________________________________      1.3  Killip class  [x]  I []  II []  III  []  IV  []  UNK      1.4  Were revascularization procedures performed?    []   No  [x]   Yes      1.5  Peak cTn value     _1219____________      1.6  Type    []  Troponin T  []  Troponin I                         []  High Sensitivity Troponin T     [x]   High Sensitivity Troponin I   []  Unknown     1.7  Units  []  g/L   [x]  ng/L  []  ng/mL  []  ug/L []  Unknown                 Other, please specify ______________    1.8 Upper Range Limit   __18 ng/L________     1.9  Date of discharge:       13-Apr-2020  2. Medications at discharge  2.1   Organic Nitrates  []   No  [x]   Yes    2.2  Beta-blocker   []   No  [x]   Yes    2.3  ACE inhibitor  [x]   No  []   Yes    2.4  Angiotensin II receptor blocker  []   No  [x]   Yes    2.5  Calcium channel blocker  [x]   No  []   Yes    2.6  Aldosterone receptor blocker  [x]   No  []   Yes    2.7  Statin   []   No  [x]   Yes    2.8  PCSK9 inhibitors [x]   No  []   Yes       Form based on [employer] SOS-AMI_CRF_Version 5.0 - 27OCT2021 pgs 2-5,8              [id number], [id number]          Subject ID: [id number] [name], RN Trainer's signature:  on file- Delegation of Authority Log Date of visit:    26-Apr-2020  SOS-AMI ELIGIBILITY:  INCLUSION / EXCLUSION CRITERIA  Inclusion Criteria:  1. Signed and dated informed consent  []   No   [x]   Yes  12. >/= 75 years old (or age of majority in local region). []   No  [x]   Yes  3.  Discharged with a confirmed diagnosis of symptomatic         Type 1 AMI within 4 weeks prior to randomization.  []   No  [x]   Yes  4. Presence of either a second prior AMI within 1 year of screening  [x]  No []  Yes         Or at least 2 of the following:       A.  Second  prior AMI more than 1 year before screening  []   No  [x]   Yes     B.  Diabetes Mellitus []   No  [x]   Yes     C.  Chronic Kidney Disease    [x]   No  []   Yes     D.  Multivessel Coronary Artery Disease   []   No  [x]   Yes     E.  Peripheral Artery Disease  [x]   No  []   Yes     F.  Age >/= 65 years  []   No  [x]   yes     G. Absence of coronary revascularization of the qualifying AMI.  [x]   No  []  Yes     H. Active daily smoking at screening [x]   No  []   Yes  5.  Subject having successfully  self-administered placebo during screening. []   No  [x]   Yes  6.  Women of childbearing potential who fulfill the following criteria: N/A      - Negative pregnancy test (Urine or Serum) at randomization    []   No  []   Yes      - Agreement to use an acceptable contraceptive method. []   No  []   Yes  Exclusion Criteria:  1.  Increased risk of serious bleeding including any of the following:       A. History of intracranial bleed.  [x]   No  []   Yes     B. Known uncorrected intracranial vascular abnormality [x]   No  []   Yes     C. Gastrointestinal bleed requiring hospitalization or transfusion  Within 1 year prior to screening  [x]   No  []   Yes     D. Subjects on oral triple antithrombotic therapy [x]   No  []   Yes     E. Known liver impairment significantly affecting hepatic function [x]   No  []   Yes     F. Current dialysis  [x]   No  []   Yes     G. Ischemic stroke or transient ischemic attack within 3 months of screening [x]  No  []  Yes  2. Chronic anemia with hemoglobin <10 g/dL [x]   No  []   Yes  3. Chronic thrombocytopenia with platelet count <100,000 /mm3.      [x]   No  []   Yes  4. Concomitant diseases or conditions that in the opinion of the investigator are           not compatible with study participation.   [x]   No  []  Yes   5. Known hypersensitivity to selatogrel,m any of its excipients, or drugs           of the P2Y12 class.  [x]   No  []   Yes  6. Previous exposure to an investigational drug within 3 months            prior to randomization     [x]   No  []   Yes   7. Participation in another clinical trial with an investigational product     or device within 3 months prior to randomization    [x]   No   []   Yes  8.  Pregnant, planning to become pregnant, or lactating women N/A   []   No  []   Yes  9.  Known concomitant life-threatening disease with a                       life expectancy <12 months         [x]   No  []   Yes   DID SUBJECT MEET ALL THE ELIGIBILITY  CRITERIA?  []  NO   [x]  YES  If NO, please list Inclusion/Exclusion criteria number(s) not met________________    Form based on [employer] source document  eCRF  Version 5.0 - 03-Dec-2019 pg 16

## 2020-05-11 ENCOUNTER — Telehealth: Payer: Self-pay

## 2020-05-11 ENCOUNTER — Other Ambulatory Visit: Payer: Self-pay | Admitting: Nurse Practitioner

## 2020-05-11 MED ORDER — ALPRAZOLAM 0.5 MG PO TABS: 0.5000 mg | ORAL_TABLET | Freq: Three times a day (TID) | ORAL | 2 refills | Status: DC

## 2020-05-11 NOTE — Telephone Encounter (Signed)
Please send. It wont allow me to send a narcotic.

## 2020-05-11 NOTE — Telephone Encounter (Signed)
Please send in alprazolam to [facility]

## 2020-05-11 NOTE — Telephone Encounter (Signed)
Sent!

## 2020-05-13 ENCOUNTER — Encounter: Payer: Self-pay | Admitting: *Deleted

## 2020-05-13 DIAGNOSIS — Z006 Encounter for examination for normal comparison and control in clinical research program: Secondary | ICD-10-CM

## 2020-05-13 NOTE — Research (Signed)
SOS-AMI Visit 2 Phone call:  Patient is doing very well. We reviewed his recollection of his training for the autoinjection. He has had no AEs, SAEs, or hospitalizations since he was last seen. He has had no changes to his medications. I confirmed that he and his daughter have my cell phone number in case they have any questions or concerns.                         [id number], [id number]     FOLLOW-UP PHONE CALLS  SUBJECT ID:  [id number] [name], BSN, CVRN-BC Trainer's signature: on Delegation of Authority Log Date of the Follow up call:   13-May-2020  Visit Number: 2  Start time of the Follow up call: 1330       End time of call: 1341   - CONTACT  Q1;  Was the follow up phone call done with the subject?  [x]   YES  []   NO           If NO, check the following:      Q2:    Was the follow up phone call done with a family member               Or caregiver?    []   YES   [x]   NO          Make note about reasons ___________________________________    - MEDICAL CONDITION      Q3:  Did any of the following occur?   []   Death       []   Hospitalization (any cause)    None     []   Use of autoinjector  Make note about the type of event, and when it occurred. In case of hospitalization: the Location of the hospital and/or the treating physician's contact details  ____________ ____________________________________________________________________ ____________________________________________________________________  Note: remember to report any SAE/AE which has occurred within 30 days after any  injection of the study drug on relevant eCRF forms.   Q4:  Did the subject develop any condition which is an          exclusion criterion?  []   YES  [x]   NO   Take note about the occurrence of any exclusion criterion after randomization: _________________________________________________________________ __________________________________________________________________  Q5:  Was there any change in subject's antithrombotic therapy?   []   YES  [x]   NO     Take not about any change of antithrombotic treatment: _______________________ ____________________________________________________________________ ____________________________________________________________________      Lezlie Octave OF THE STUDY-SPECFIC TRAINING  Q6: Did the subject correctly reply to the following questions?       A.  What are common heart attack symptoms?      [x]   YES   []   NO      B.  What has to be done in case any of those symptoms occurs? [x]   YES  []   NO      C.  What are the main steps to perform a self-injection?  [x]   YES  []   NO             If NO, then report which step/s was/were missing:        []   Choose injection site (abdomen or thigh)       []   Twist cap off       []   Pinch skin and place the study autoinjector       []   Firmly push down and hold for 3 seconds       D. What has to be done immediately after an injection?  [x]   YES   []   NO          If NO, then report which step/s was/were missing:       []   Call  for emergency medical help       []   Show the autoinjector to the emergency medical responder       E.  Does the subject recall where s/he keeps/ stores the autoinjectors? [x]  YES  []  NO          Note the place of storage and any corrective explanation if needed below __________________________________________________________________ __________________________________________________________________  - TRAINING REFRESHER   Q7:  Is a training refresher needed?      []  YES  [x]  NO        If YES, indicate items that have to be refreshed. More than one may apply:               []   Heart attack symptoms             []   Actions to be taken following heart attack symptoms             []   Steps to perform the self-injection and follow-up actions to be taken   []   Other, Specify  _________________________________________     Form Based on [facility]  SOS-AMI_CRF_Version 5.0_[id number] [id number], 62,            and eCRF  Version 5.0-  03-Dec-2019     Current Outpatient Medications:  .  acetaminophen (TYLENOL) 500 MG tablet, Take 250 mg by mouth daily., Disp: , Rfl:  .  ALPRAZolam (XANAX) 0.5 MG tablet, Take 1 tablet (0.5 mg total) by mouth in the morning, at noon, and at bedtime., Disp: 90 tablet, Rfl: 2 .  aspirin EC 81 MG tablet, Take 81 mg by mouth daily., Disp: , Rfl:  .  atorvastatin (LIPITOR) 80 MG tablet, Take 1 tablet (80 mg total) by mouth daily., Disp: 30 tablet, Rfl: 6 .  clopidogrel (PLAVIX) 75 MG tablet, Take 1 tablet (75 mg total) by mouth daily. (Patient taking differently: Take 75 mg by mouth daily in the afternoon.), Disp: 30 tablet, Rfl: 11 .  escitalopram (LEXAPRO) 10 MG tablet, Take 1 tablet (10 mg total) by mouth daily in the afternoon., Disp: 30 tablet, Rfl: 3 .  glipiZIDE-metformin (METAGLIP) 2.5-500 MG tablet, TAKE 2 TABLETS BY MOUTH IN THE MORNING AND 1 TABLET AT NIGHT, Disp: 270 tablet, Rfl: 1 .  HYDROcodone-acetaminophen (NORCO) 10-325 MG tablet, Take 1 tablet by mouth every 6 (six) hours as needed for moderate pain., Disp: 120 tablet, Rfl: 0 .  hydrocortisone (ANUSOL-HC) 2.5 % rectal cream, Place 1 application rectally 4 (four) times daily as needed (hemorrhoids/discomfort). , Disp: , Rfl:  .  LINZESS 145 MCG CAPS capsule, Take 145 mcg by mouth daily as needed (constipation.). , Disp: , Rfl:  .  losartan (COZAAR) 25 MG tablet, Take 1 tablet (25 mg total) by mouth daily., Disp: 90 tablet, Rfl: 0 .  metoprolol tartrate (LOPRESSOR) 25 MG tablet, Take 12.5 mg by mouth 2 (two) times daily., Disp: , Rfl:  .  Misc Natural Products (NEURIVA PO), Take 1 tablet by mouth daily in the afternoon., Disp: , Rfl:  .  nitroGLYCERIN (NITROSTAT) 0.4 MG SL tablet, Place 1 tablet (0.4 mg total) under the tongue every 5 (  five) minutes x 3 doses as needed for chest pain., Disp: 25 tablet, Rfl: 3 .  terazosin (HYTRIN) 5 MG capsule, Take 1  capsule (5 mg total) by mouth daily in the afternoon., Disp: 90 capsule, Rfl: 1 .  traZODone (DESYREL) 50 MG tablet, Take 1 tablet (50 mg total) by mouth at bedtime., Disp: 90 tablet, Rfl: 1 .  Study - [facility]I - selatogrel 16 mg/0.5 mL or placebo SQ injection (PI-Christopher), Inject 0.5 mLs (16 mg total) into the skin once for 1 dose. Inject 0.5 mL subcutaneously in the abdomen or thigh if you experience  symptoms of a heart attack. Call 911 immediately  and seek emergency medical support. (Patient taking differently: Inject 16 mg into the skin as needed. Inject 0.5 mL subcutaneously in the abdomen or thigh if you experience  symptoms of a heart attack. Call 911 immediately  and seek emergency medical support.), Disp: 0.5 mL, Rfl: 0

## 2020-05-24 ENCOUNTER — Ambulatory Visit: Payer: Medicare Other | Admitting: Nurse Practitioner

## 2020-06-01 ENCOUNTER — Other Ambulatory Visit: Payer: Self-pay

## 2020-06-01 ENCOUNTER — Encounter: Payer: Self-pay | Admitting: *Deleted

## 2020-06-01 ENCOUNTER — Encounter: Payer: Self-pay | Admitting: Nurse Practitioner

## 2020-06-01 ENCOUNTER — Ambulatory Visit (INDEPENDENT_AMBULATORY_CARE_PROVIDER_SITE_OTHER): Payer: Medicare Other | Admitting: Nurse Practitioner

## 2020-06-01 VITALS — BP 126/84 | HR 68 | Temp 97.9°F | Resp 20 | Ht 68.5 in | Wt 297.0 lb

## 2020-06-01 DIAGNOSIS — Z006 Encounter for examination for normal comparison and control in clinical research program: Secondary | ICD-10-CM

## 2020-06-01 DIAGNOSIS — E119 Type 2 diabetes mellitus without complications: Secondary | ICD-10-CM

## 2020-06-01 DIAGNOSIS — I10 Essential (primary) hypertension: Secondary | ICD-10-CM

## 2020-06-01 MED ORDER — CLOPIDOGREL BISULFATE 75 MG PO TABS: 75.0000 mg | ORAL_TABLET | Freq: Every day | ORAL | 3 refills | Status: AC

## 2020-06-01 MED ORDER — METOPROLOL TARTRATE 25 MG PO TABS: 12.5000 mg | ORAL_TABLET | Freq: Two times a day (BID) | ORAL | 3 refills | Status: AC

## 2020-06-01 NOTE — Progress Notes (Signed)
Acute Office Visit  Subjective:    Patient ID: Steven Stevens, male    DOB: Nov 10, 1945, 75 y.o.   MRN: [id number]  Chief Complaint  Patient presents with  . Hypertension  . Diabetes    HPI Patient is in today for BP follow-up. At last OV, his BP increased quite a bit with ambulation, but returned to normal limits with rest.  He takes metoprolol, losartan, terazosin, and PRN nitro. His home readings are usually about 130/75 at home.   Past Medical History:  Diagnosis Date  . Anxiety   . Chronic lower back pain   . Coronary artery disease    DES to RCA 2005, DES to RCA 2017, DES x2 to CTO LAD 2017  . Depression   . Hyperlipidemia   . Hypertension   . Myocardial infarct ([place]) 01/27/2004  . Type 2 diabetes mellitus ([place])   . Type II diabetes mellitus ([place])     Past Surgical History:  Procedure Laterality Date  . BACK SURGERY    . CARDIAC CATHETERIZATION N/A 03/30/2015   Procedure: Left Heart Cath and Coronary Angiography;  Surgeon: Jettie Booze, MD;  Location: [facility];  Service: Cardiovascular;  Laterality: N/A;  . CARDIAC CATHETERIZATION  03/30/2015   Procedure: Coronary Stent Intervention;  Surgeon: Jettie Booze, MD;  Location: [facility];  Service: Cardiovascular;;  . CARDIAC CATHETERIZATION N/A 04/14/2015   Procedure: Coronary/Bypass Graft CTO Intervention;  Surgeon: Jettie Booze, MD;  Location: [facility];  Service: Cardiovascular;  Laterality: N/A;  . CARDIAC CATHETERIZATION  04/14/2015   Procedure: Left Heart Cath and Coronary Angiography;  Surgeon: Jettie Booze, MD;  Location: [facility];  Service: Cardiovascular;;  . CATARACT EXTRACTION W/PHACO Right 12/03/2012   Procedure: CATARACT EXTRACTION RIGHT EYE (WITH PHACO) AND INTRAOCULAR LENS PLACEMENT  CDE=2.41;  Surgeon: Elta Guadeloupe [name], MD;  Location: [facility];  Service: Ophthalmology;  Laterality: Right;  . CATARACT EXTRACTION W/PHACO Left 12/17/2012    Procedure: CATARACT EXTRACTION PHACO AND INTRAOCULAR LENS PLACEMENT (IOC);  Surgeon: Elta Guadeloupe [name], MD;  Location: [facility];  Service: Ophthalmology;  Laterality: Left;  CDE:7.82  . COLON SURGERY N/A    Phreesia 01/11/2020  . COLONOSCOPY  2011   Dr. Gala Romney: diverticulosis, tubular adenomas  . COLONOSCOPY WITH PROPOFOL N/A 07/24/2016   R[facility]k: 3 tubular adenomas removed.  Diverticulosis.  3-year surveillance colonoscopy recommended.  . COLONOSCOPY WITH PROPOFOL N/A 12/15/2019   Procedure: COLONOSCOPY WITH PROPOFOL;  Surgeon: Daneil Dolin, MD;  [facility];  Service: Endoscopy;  Laterality: N/A;  8:30am  . CORONARY ANGIOPLASTY WITH STENT PLACEMENT  2005  . CORONARY STENT INTERVENTION N/A 04/12/2020   Procedure: CORONARY STENT INTERVENTION;  Surgeon: Sherren Mocha, MD;  Location: [facility];  Service: Cardiovascular;  Laterality: N/A;  . CORONARY STENT PLACEMENT  03/30/2015   RCA    DES  . EYE SURGERY N/A    Phreesia 01/11/2020  . HERNIA REPAIR    . INTRAVASCULAR IMAGING/OCT N/A 04/12/2020   Procedure: INTRAVASCULAR IMAGING/OCT;  Surgeon: Sherren Mocha, MD;  Location: [facility];  Service: Cardiovascular;  Laterality: N/A;  . KNEE ARTHROSCOPY Left 2012  . LEFT HEART CATH AND CORONARY ANGIOGRAPHY N/A 04/12/2020   Procedure: LEFT HEART CATH AND CORONARY ANGIOGRAPHY;  Surgeon: Sherren Mocha, MD;  Location: [facility];  Service: Cardiovascular;  Laterality: N/A;  . [place]   "herniated disc"  .  POLYPECTOMY  07/24/2016   Procedure: POLYPECTOMY;  Surgeon: Daneil Dolin, MD;  [facility];  Service: Endoscopy;;  ascending colon polyp, splenic flexure polyps times 2  . POLYPECTOMY  12/15/2019   Procedure: POLYPECTOMY;  Surgeon: Daneil Dolin, MD;  [facility];  Service: Endoscopy;;  . UMBILICAL HERNIA REPAIR  1990s  . YAG LASER APPLICATION Right 00/93/8182   Procedure: YAG LASER APPLICATION;  Surgeon: Elta Guadeloupe [name],  MD;  Location: [facility];  Service: Ophthalmology;  Laterality: Right;    Family History  Problem Relation Age of Onset  . Colon cancer Mother        deceased age 73, diagnosed with CRC in 42s.    Social History   Socioeconomic History  . Marital status: Widowed    Spouse name: Not on file  . Number of children: Not on file  . Years of education: Not on file  . Highest education level: Not on file  Occupational History  . Not on file  Tobacco Use  . Smoking status: Former Smoker    Packs/day: 0.10    Years: 10.00    Pack years: 1.00    Types: Cigarettes    Quit date: 01/31/2004    Years since quitting: 16.3  . Smokeless tobacco: Never Used  Vaping Use  . Vaping Use: Never used  Substance and Sexual Activity  . Alcohol use: No    Alcohol/week: 0.0 standard drinks  . Drug use: No  . Sexual activity: Never  Other Topics Concern  . Not on file  Social History Narrative  . Not on file   Social Determinants of Health   Financial Resource Strain: Not on file  Food Insecurity: Not on file  Transportation Needs: Not on file  Physical Activity: Not on file  Stress: Not on file  Social Connections: Not on file  Intimate Partner Violence: Not on file    Outpatient Medications Prior to Visit  Medication Sig Dispense Refill  . acetaminophen (TYLENOL) 500 MG tablet Take 250 mg by mouth daily.    Marland Kitchen ALPRAZolam (XANAX) 0.5 MG tablet Take 1 tablet (0.5 mg total) by mouth in the morning, at noon, and at bedtime. 90 tablet 2  . aspirin EC 81 MG tablet Take 81 mg by mouth daily.    Marland Kitchen atorvastatin (LIPITOR) 80 MG tablet Take 1 tablet (80 mg total) by mouth daily. 30 tablet 6  . escitalopram (LEXAPRO) 10 MG tablet Take 1 tablet (10 mg total) by mouth daily in the afternoon. 30 tablet 3  . glipiZIDE-metformin (METAGLIP) 2.5-500 MG tablet TAKE 2 TABLETS BY MOUTH IN THE MORNING AND 1 TABLET AT NIGHT 270 tablet 1  . HYDROcodone-acetaminophen (NORCO) 10-325 MG tablet Take 1 tablet by mouth  every 6 (six) hours as needed for moderate pain. 120 tablet 0  . hydrocortisone (ANUSOL-HC) 2.5 % rectal cream Place 1 application rectally 4 (four) times daily as needed (hemorrhoids/discomfort).     Marland Kitchen LINZESS 145 MCG CAPS capsule Take 145 mcg by mouth daily as needed (constipation.).     Marland Kitchen losartan (COZAAR) 25 MG tablet Take 1 tablet (25 mg total) by mouth daily. 90 tablet 0  . Misc Natural Products (NEURIVA PO) Take 1 tablet by mouth daily in the afternoon.    . nitroGLYCERIN (NITROSTAT) 0.4 MG SL tablet Place 1 tablet (0.4 mg total) under the tongue every 5 (five) minutes x 3 doses as needed for chest pain. 25 tablet 3  . terazosin (HYTRIN)  5 MG capsule Take 1 capsule (5 mg total) by mouth daily in the afternoon. 90 capsule 1  . traZODone (DESYREL) 50 MG tablet Take 1 tablet (50 mg total) by mouth at bedtime. 90 tablet 1  . clopidogrel (PLAVIX) 75 MG tablet Take 1 tablet (75 mg total) by mouth daily. (Patient taking differently: Take 75 mg by mouth daily in the afternoon.) 30 tablet 11  . metoprolol tartrate (LOPRESSOR) 25 MG tablet Take 12.5 mg by mouth 2 (two) times daily.    . Study - [facility]OS-[facility]I - selatogrel 16 mg/0.5 mL or placebo SQ injection (PI-Christopher) Inject 0.5 mLs (16 mg total) into the skin once for 1 dose. Inject 0.5 mL subcutaneously in the abdomen or thigh if you experience  symptoms of a heart attack. Call 911 immediately  and seek emergency medical support. (Patient taking differently: Inject 16 mg into the skin as needed. Inject 0.5 mL subcutaneously in the abdomen or thigh if you experience  symptoms of a heart attack. Call 911 immediately  and seek emergency medical support.) 0.5 mL 0   No facility-administered medications prior to visit.    No Known Allergies  Review of Systems  Constitutional: Negative.   Respiratory: Negative.   Cardiovascular: Negative.   Musculoskeletal: Positive for arthralgias.       Chronic; pain controlled with meds  Psychiatric/Behavioral:  Negative.        Objective:    Physical Exam Constitutional:      Appearance: Normal appearance. He is obese.  Cardiovascular:     Rate and Rhythm: Normal rate and regular rhythm.     Pulses: Normal pulses.     Heart sounds: Normal heart sounds.  Pulmonary:     Effort: Pulmonary effort is normal.     Breath sounds: Normal breath sounds.  Musculoskeletal:        General: Normal range of motion.  Neurological:     Mental Status: He is alert.  Psychiatric:        Mood and Affect: Mood normal.        Behavior: Behavior normal.        Thought Content: Thought content normal.        Judgment: Judgment normal.     BP (!) 169/83   Pulse 68   Temp 97.9 F (36.6 C)   Resp 20   Ht 5' 8.5" (1.74 m)   Wt 297 lb (134.7 kg)   SpO2 93%   BMI 44.50 kg/m  Wt Readings from Last 3 Encounters:  06/01/20 297 lb (134.7 kg)  04/23/20 (!) 304 lb (137.9 kg)  04/20/20 (!) 303 lb (137.4 kg)    Health Maintenance Due  Topic Date Due  . COVID-19 Vaccine (1) Never done  . FOOT EXAM  Never done  . OPHTHALMOLOGY EXAM  Never done  . PNA vac Low Risk Adult (1 of 2 - PCV13) Never done    There are no preventive care reminders to display for this patient.   Lab Results  Component Value Date   TSH 1.966 04/12/2020   Lab Results  Component Value Date   WBC 9.1 04/13/2020   HGB 11.5 (L) 04/13/2020   HCT 34.0 (L) 04/13/2020   MCV 91.9 04/13/2020   PLT 210 04/13/2020   Lab Results  Component Value Date   NA 137 04/13/2020   K 3.7 04/13/2020   CO2 27 04/13/2020   GLUCOSE 113 (H) 04/13/2020   BUN 11 04/13/2020   CREATININE 1.19 04/13/2020  BILITOT 0.9 04/13/2020   ALKPHOS 25 (L) 04/13/2020   AST 36 04/13/2020   ALT 26 04/13/2020   PROT 5.8 (L) 04/13/2020   ALBUMIN 3.2 (L) 04/13/2020   CALCIUM 8.7 (L) 04/13/2020   ANIONGAP 8 04/13/2020   Lab Results  Component Value Date   CHOL 148 04/12/2020   Lab Results  Component Value Date   HDL 40 (L) 04/12/2020   Lab Results   Component Value Date   LDLCALC 85 04/12/2020   Lab Results  Component Value Date   TRIG 117 04/12/2020   Lab Results  Component Value Date   CHOLHDL 3.7 04/12/2020   Lab Results  Component Value Date   HGBA1C 7.0 (H) 04/12/2020       Assessment & Plan:   Problem List Items Addressed This Visit      Cardiovascular and Mediastinum   Essential hypertension    -manual BP 126/84 -continue home meds -states that since he had stent placed, he has had more energy and was out mowing his yard and weedeating      Relevant Medications   metoprolol tartrate (LOPRESSOR) 25 MG tablet   Other Relevant Orders   CBC with Differential/Platelet   CMP14+EGFR   Lipid Panel With LDL/HDL Ratio     Endocrine   Type 2 diabetes mellitus treated without insulin ([place]) - Primary    -will check A1c with next set of labs      Relevant Orders   Hemoglobin A1c   Lipid Panel With LDL/HDL Ratio   Microalbumin / creatinine urine ratio     Other   Morbid obesity ([place])    Wt Readings from Last 3 Encounters:  06/01/20 297 lb (134.7 kg)  04/23/20 (!) 304 lb (137.9 kg)  04/20/20 (!) 303 lb (137.4 kg)   -he has lost some weight since his stent was placed; congratulations! -keep up the good work!          Meds ordered this encounter  Medications  . clopidogrel (PLAVIX) 75 MG tablet    Sig: Take 1 tablet (75 mg total) by mouth daily.    Dispense:  90 tablet    Refill:  3  . metoprolol tartrate (LOPRESSOR) 25 MG tablet    Sig: Take 0.5 tablets (12.5 mg total) by mouth 2 (two) times daily.    Dispense:  90 tablet    Refill:  [place], NP

## 2020-06-01 NOTE — Assessment & Plan Note (Signed)
-  will check A1c with next set of labs

## 2020-06-01 NOTE — Assessment & Plan Note (Signed)
-  manual BP 126/84 -continue home meds -states that since he had stent placed, he has had more energy and was out mowing his yard and weedeating

## 2020-06-01 NOTE — Assessment & Plan Note (Signed)
Wt Readings from Last 3 Encounters:  06/01/20 297 lb (134.7 kg)  04/23/20 (!) 304 lb (137.9 kg)  04/20/20 (!) 303 lb (137.4 kg)   -he has lost some weight since his stent was placed; congratulations! -keep up the good work!

## 2020-06-01 NOTE — Patient Instructions (Signed)
Please have fasting labs drawn 2-3 days prior to your appointment so we can discuss the results during your office visit.  

## 2020-06-01 NOTE — Research (Signed)
[facility] Visit 3 phone call   Patient is doing very well. We reviewed his recollection of his training for the autoinjection. He has had no AEs, SAEs, or hospitalizations since he was last seen. He has had no changes to his medications. I confirmed that he and his daughter have my cell phone number in case they have any questions or concerns.                         [id number], [id number]     FOLLOW-UP PHONE CALLS  SUBJECT ID:  [id number] [name], BSN CVRN-BC Trainer's signature: on Delegation of Authority Log Date of the Follow up call:   01-Jun-2020  Visit Number: VISIT 3  Start time of the Follow up call:    11:00    End time of call: 11:09   - CONTACT  Q1;  Was the follow up phone call done with the subject?  [x]   YES  []   NO           If NO, check the following:      Q2:    Was the follow up phone call done with a family member               Or caregiver?    []   YES   [x]   NO          Make note about reasons __not at home_______    - MEDICAL CONDITION      Q3:  Did any of the following occur?   []   Death       []   Hospitalization (any cause)    NONE      []   Use of autoinjector  Make note about the type of event, and when it occurred. In case of hospitalization: the Location of the hospital and/or the treating physician's contact details  ____________ ____________________________________________________________________ ____________________________________________________________________  Note: remember to report any SAE/AE which has occurred within 30 days after any  injection of the study drug on relevant eCRF forms.   Q4:  Did the subject develop any condition which is an          exclusion criterion?  []   YES  [x]   NO   Take note about the occurrence of any exclusion criterion after randomization: _________________________________________________________________ __________________________________________________________________  Q5: Was  there any change in subject's antithrombotic therapy?   []   YES  [x]   NO     Take not about any change of antithrombotic treatment: _______________________ ____________________________________________________________________ ____________________________________________________________________      Lezlie Octave OF THE STUDY-SPECFIC TRAINING  Q6: Did the subject correctly reply to the following questions?       A.  What are common heart attack symptoms?      [x]   YES   []   NO      B.  What has to be done in case any of those symptoms occurs? [x]   YES  []   NO      C.  What are the main steps to perform a self-injection?  [x]   YES  []   NO             If NO, then report which step/s was/were missing:        []   Choose injection site (abdomen or thigh)       []   Twist cap off       []   Pinch skin and place the study autoinjector       []   Firmly push down and hold for 3 seconds       D. What has to be done immediately after an injection?  [x]   YES   []   NO          If NO, then report which step/s was/were missing:       []   Call  for emergency medical help       []   Show the autoinjector to the emergency medical responder       E.  Does the subject recall where s/he keeps/ stores the autoinjectors? [x]  YES  []  NO          Note the place of storage and any corrective explanation if needed below ______He carries  One and his daughter, Curly [place], carries the other. They live in the same household. ________________________________________  - TRAINING REFRESHER   Q7:  Is a training refresher needed?      []  YES  [x]  NO        If YES, indicate items that have to be refreshed. More than one may apply:               []   Heart attack symptoms             []   Actions to be taken following heart attack symptoms             []   Steps to perform the self-injection and follow-up actions to be taken   []   Other, Specify  _________________________________________     Form Based on [facility]  [facility]_CRF_Version 5.0_[id number] [id number], 62,            and eCRF  Version 5.0-  03-Dec-2019      Current Outpatient Medications:  .  acetaminophen (TYLENOL) 500 MG tablet, Take 250 mg by mouth daily., Disp: , Rfl:  .  ALPRAZolam (XANAX) 0.5 MG tablet, Take 1 tablet (0.5 mg total) by mouth in the morning, at noon, and at bedtime., Disp: 90 tablet, Rfl: 2 .  aspirin EC 81 MG tablet, Take 81 mg by mouth daily., Disp: , Rfl:  .  atorvastatin (LIPITOR) 80 MG tablet, Take 1 tablet (80 mg total) by mouth daily., Disp: 30 tablet, Rfl: 6 .  clopidogrel (PLAVIX) 75 MG tablet, Take 1 tablet (75 mg total) by mouth daily. (Patient taking differently: Take 75 mg by mouth daily in the afternoon.), Disp: 30 tablet, Rfl: 11 .  escitalopram (LEXAPRO) 10 MG tablet, Take 1 tablet (10 mg total) by mouth daily in the afternoon., Disp: 30 tablet, Rfl: 3 .  glipiZIDE-metformin (METAGLIP) 2.5-500 MG tablet, TAKE 2 TABLETS BY MOUTH IN THE MORNING AND 1 TABLET AT NIGHT, Disp: 270 tablet, Rfl: 1 .  HYDROcodone-acetaminophen (NORCO) 10-325 MG tablet, Take 1 tablet by mouth every 6 (six) hours as needed for moderate pain., Disp: 120 tablet, Rfl: 0 .  hydrocortisone (ANUSOL-HC) 2.5 % rectal cream, Place 1 application rectally 4 (four) times daily as needed (hemorrhoids/discomfort). , Disp: , Rfl:  .  LINZESS 145 MCG CAPS capsule, Take 145 mcg by mouth daily as needed (constipation.). , Disp: , Rfl:  .  losartan (COZAAR) 25 MG tablet, Take 1 tablet (25 mg total) by mouth daily., Disp: 90 tablet, Rfl: 0 .  metoprolol tartrate (LOPRESSOR) 25 MG tablet, Take 12.5 mg by mouth 2 (two) times daily., Disp: , Rfl:  .  Misc Natural Products (NEURIVA PO), Take 1 tablet by mouth daily in the afternoon., Disp: , Rfl:  .  nitroGLYCERIN (NITROSTAT) 0.4 MG SL tablet, Place 1 tablet (0.4 mg total) under the tongue every 5 (five) minutes x 3 doses as needed for chest pain., Disp: 25 tablet, Rfl: 3 .  Study - [facility] - selatogrel 16 mg/0.5  mL or placebo SQ injection (PI-Christopher), Inject 0.5 mLs (16 mg total) into the skin once for 1 dose. Inject 0.5 mL subcutaneously in the abdomen or thigh if you experience  symptoms of a heart attack. Call 911 immediately  and seek emergency medical support. (Patient taking differently: Inject 16 mg into the skin as needed. Inject 0.5 mL subcutaneously in the abdomen or thigh if you experience  symptoms of a heart attack. Call 911 immediately  and seek emergency medical support.), Disp: 0.5 mL, Rfl: 0 .  terazosin (HYTRIN) 5 MG capsule, Take 1 capsule (5 mg total) by mouth daily in the afternoon., Disp: 90 capsule, Rfl: 1 .  traZODone (DESYREL) 50 MG tablet, Take 1 tablet (50 mg total) by mouth at bedtime., Disp: 90 tablet, Rfl: 1

## 2020-06-08 ENCOUNTER — Telehealth: Payer: Self-pay

## 2020-06-08 ENCOUNTER — Other Ambulatory Visit: Payer: Self-pay | Admitting: Nurse Practitioner

## 2020-06-08 MED ORDER — HYDROCODONE-ACETAMINOPHEN 10-325 MG PO TABS: 1.0000 | ORAL_TABLET | Freq: Four times a day (QID) | ORAL | 0 refills | Status: DC | PRN

## 2020-06-08 NOTE — Telephone Encounter (Signed)
Patient called need med refill HYDROcodone-acetaminophen (NORCO) 10-325 MG tablet   Pharmacy: [facility]  

## 2020-06-08 NOTE — Telephone Encounter (Signed)
sent 

## 2020-06-21 ENCOUNTER — Ambulatory Visit (INDEPENDENT_AMBULATORY_CARE_PROVIDER_SITE_OTHER): Payer: Medicare Other

## 2020-06-21 ENCOUNTER — Encounter: Payer: Self-pay | Admitting: *Deleted

## 2020-06-21 ENCOUNTER — Other Ambulatory Visit: Payer: Self-pay

## 2020-06-21 DIAGNOSIS — Z0000 Encounter for general adult medical examination without abnormal findings: Secondary | ICD-10-CM

## 2020-06-21 DIAGNOSIS — Z23 Encounter for immunization: Secondary | ICD-10-CM | POA: Diagnosis not present

## 2020-06-21 DIAGNOSIS — Z006 Encounter for examination for normal comparison and control in clinical research program: Secondary | ICD-10-CM

## 2020-06-21 NOTE — Patient Instructions (Signed)
[name] , Thank you for taking time to come for your Medicare Wellness Visit. I appreciate your ongoing commitment to your health goals. Please review the following plan we discussed and let me know if I can assist you in the future.   Screening recommendations/referrals: Colonoscopy: no longer recommended Recommended yearly ophthalmology/optometry visit for glaucoma screening and checkup Recommended yearly dental visit for hygiene and checkup  Vaccinations: Influenza vaccine: Due Sept 2022 Pneumococcal vaccine: Prevnar 13 given today  Tdap vaccine: up to date until 2023 Shingles vaccine: completed series.       Next appointment: wellness Jun 22, 2021 2:00pm  Preventive Care 75 Years and Older, Male Preventive care refers to lifestyle choices and visits with your health care provider that can promote health and wellness. What does preventive care include?  A yearly physical exam. This is also called an annual well check.  Dental exams once or twice a year.  Routine eye exams. Ask your health care provider how often you should have your eyes checked.  Personal lifestyle choices, including:  Daily care of your teeth and gums.  Regular physical activity.  Eating a healthy diet.  Avoiding tobacco and drug use.  Limiting alcohol use.  Practicing safe sex.  Taking low doses of aspirin every day.  Taking vitamin and mineral supplements as recommended by your health care provider. What happens during an annual well check? The services and screenings done by your health care provider during your annual well check will depend on your age, overall health, lifestyle risk factors, and family history of disease. Counseling  Your health care provider may ask you questions about your:  Alcohol use.  Tobacco use.  Drug use.  Emotional well-being.  Home and relationship well-being.  Sexual activity.  Eating habits.  History of falls.  Memory and ability to  understand (cognition).  Work and work Statistician. Screening  You may have the following tests or measurements:  Height, weight, and BMI.  Blood pressure.  Lipid and cholesterol levels. These may be checked every 5 years, or more frequently if you are over 64 years old.  Skin check.  Lung cancer screening. You may have this screening every year starting at age 65 if you have a 30-pack-year history of smoking and currently smoke or have quit within the past 15 years.  Fecal occult blood test (FOBT) of the stool. You may have this test every year starting at age 26.  Flexible sigmoidoscopy or colonoscopy. You may have a sigmoidoscopy every 5 years or a colonoscopy every 10 years starting at age 88.  Prostate cancer screening. Recommendations will vary depending on your family history and other risks.  Hepatitis C blood test.  Hepatitis B blood test.  Sexually transmitted disease (STD) testing.  Diabetes screening. This is done by checking your blood sugar (glucose) after you have not eaten for a while (fasting). You may have this done every 1-3 years.  Abdominal aortic aneurysm (AAA) screening. You may need this if you are a current or former smoker.  Osteoporosis. You may be screened starting at age 52 if you are at high risk. Talk with your health care provider about your test results, treatment options, and if necessary, the need for more tests. Vaccines  Your health care provider may recommend certain vaccines, such as:  Influenza vaccine. This is recommended every year.  Tetanus, diphtheria, and acellular pertussis (Tdap, Td) vaccine. You may need a Td booster every 10 years.  Zoster vaccine. You may need  this after age 41.  Pneumococcal 13-valent conjugate (PCV13) vaccine. One dose is recommended after age 8.  Pneumococcal polysaccharide (PPSV23) vaccine. One dose is recommended after age 22. Talk to your health care provider about which screenings and vaccines you  need and how often you need them. This information is not intended to replace advice given to you by your health care provider. Make sure you discuss any questions you have with your health care provider. Document Released: 02/19/2015 Document Revised: 10/13/2015 Document Reviewed: 11/24/2014 Elsevier Interactive Patient Education  2017 [employer] Prevention in the Home Falls can cause injuries. They can happen to people of all ages. There are many things you can do to make your home safe and to help prevent falls. What can I do on the outside of my home?  Regularly fix the edges of walkways and driveways and fix any cracks.  Remove anything that might make you trip as you walk through a door, such as a raised step or threshold.  Trim any bushes or trees on the path to your home.  Use bright outdoor lighting.  Clear any walking paths of anything that might make someone trip, such as rocks or tools.  Regularly check to see if handrails are loose or broken. Make sure that both sides of any steps have handrails.  Any raised decks and porches should have guardrails on the edges.  Have any leaves, snow, or ice cleared regularly.  Use sand or salt on walking paths during winter.  Clean up any spills in your garage right away. This includes oil or grease spills. What can I do in the bathroom?  Use night lights.  Install grab bars by the toilet and in the tub and shower. Do not use towel bars as grab bars.  Use non-skid mats or decals in the tub or shower.  If you need to sit down in the shower, use a plastic, non-slip stool.  Keep the floor dry. Clean up any water that spills on the floor as soon as it happens.  Remove soap buildup in the tub or shower regularly.  Attach bath mats securely with double-sided non-slip rug tape.  Do not have throw rugs and other things on the floor that can make you trip. What can I do in the bedroom?  Use night lights.  Make sure  that you have a light by your bed that is easy to reach.  Do not use any sheets or blankets that are too big for your bed. They should not hang down onto the floor.  Have a firm chair that has side arms. You can use this for support while you get dressed.  Do not have throw rugs and other things on the floor that can make you trip. What can I do in the kitchen?  Clean up any spills right away.  Avoid walking on wet floors.  Keep items that you use a lot in easy-to-reach places.  If you need to reach something above you, use a strong step stool that has a grab bar.  Keep electrical cords out of the way.  Do not use floor polish or wax that makes floors slippery. If you must use wax, use non-skid floor wax.  Do not have throw rugs and other things on the floor that can make you trip. What can I do with my stairs?  Do not leave any items on the stairs.  Make sure that there are handrails on both sides of  the stairs and use them. Fix handrails that are broken or loose. Make sure that handrails are as long as the stairways.  Check any carpeting to make sure that it is firmly attached to the stairs. Fix any carpet that is loose or worn.  Avoid having throw rugs at the top or bottom of the stairs. If you do have throw rugs, attach them to the floor with carpet tape.  Make sure that you have a light switch at the top of the stairs and the bottom of the stairs. If you do not have them, ask someone to add them for you. What else can I do to help prevent falls?  Wear shoes that:  Do not have high heels.  Have rubber bottoms.  Are comfortable and fit you well.  Are closed at the toe. Do not wear sandals.  If you use a stepladder:  Make sure that it is fully opened. Do not climb a closed stepladder.  Make sure that both sides of the stepladder are locked into place.  Ask someone to hold it for you, if possible.  Clearly mark and make sure that you can see:  Any grab bars or  handrails.  First and last steps.  Where the edge of each step is.  Use tools that help you move around (mobility aids) if they are needed. These include:  Canes.  Walkers.  Scooters.  Crutches.  Turn on the lights when you go into a dark area. Replace any light bulbs as soon as they burn out.  Set up your furniture so you have a clear path. Avoid moving your furniture around.  If any of your floors are uneven, fix them.  If there are any pets around you, be aware of where they are.  Review your medicines with your doctor. Some medicines can make you feel dizzy. This can increase your chance of falling. Ask your doctor what other things that you can do to help prevent falls. This information is not intended to replace advice given to you by your health care provider. Make sure you discuss any questions you have with your health care provider. Document Released: 11/19/2008 Document Revised: 07/01/2015 Document Reviewed: 02/27/2014 Elsevier Interactive Patient Education  2017 [employer].

## 2020-06-21 NOTE — Research (Addendum)
Called patient and left message.  I called to ask patient if the labels on his autoinjectors are still readable or are damaged in anyway. This is per the sponsor's email regarding labels that are fading out and are unreadable for the patient.   Steven Stevens called back and said that he can read both labels on his autoinjectors. I cautioned him to keep them in a plastic bag to keep the label from wearing off.

## 2020-06-21 NOTE — Progress Notes (Signed)
Subjective:   Steven Stevens is a 75 y.o. male who presents for an Initial Medicare Annual Wellness Visit.  Review of Systems     Cardiac Risk Factors include: advanced age (>39men, >66 women);diabetes mellitus;dyslipidemia;obesity (BMI >30kg/m2);smoking/ tobacco exposure     Objective:    Today's Vitals   06/21/20 1316  PainSc: 0-No pain   There is no height or weight on file to calculate BMI.  Advanced Directives 04/12/2020 12/15/2019 12/11/2019 07/24/2016 07/21/2016 04/14/2015 03/30/2015  Does Patient Have a Medical Advance Directive? No No No No No No No  Would patient like information on creating a medical advance directive? No - Patient declined No - Patient declined Yes (MAU/Ambulatory/Procedural Areas - Information given) No - Patient declined No - Patient declined No - patient declined information Yes - Educational materials given    Current Medications (verified) Outpatient Encounter Medications as of 06/21/2020  Medication Sig  . acetaminophen (TYLENOL) 500 MG tablet Take 250 mg by mouth daily.  Marland Kitchen ALPRAZolam (XANAX) 0.5 MG tablet Take 1 tablet (0.5 mg total) by mouth in the morning, at noon, and at bedtime.  Marland Kitchen aspirin EC 81 MG tablet Take 81 mg by mouth daily.  Marland Kitchen atorvastatin (LIPITOR) 80 MG tablet Take 1 tablet (80 mg total) by mouth daily.  . clopidogrel (PLAVIX) 75 MG tablet Take 1 tablet (75 mg total) by mouth daily.  Marland Kitchen escitalopram (LEXAPRO) 10 MG tablet Take 1 tablet (10 mg total) by mouth daily in the afternoon.  Marland Kitchen glipiZIDE-metformin (METAGLIP) 2.5-500 MG tablet TAKE 2 TABLETS BY MOUTH IN THE MORNING AND 1 TABLET AT NIGHT  . HYDROcodone-acetaminophen (NORCO) 10-325 MG tablet Take 1 tablet by mouth every 6 (six) hours as needed for moderate pain.  . hydrocortisone (ANUSOL-HC) 2.5 % rectal cream Place 1 application rectally 4 (four) times daily as needed (hemorrhoids/discomfort).   Marland Kitchen LINZESS 145 MCG CAPS capsule Take 145 mcg by mouth daily as needed (constipation.).   Marland Kitchen  losartan (COZAAR) 25 MG tablet Take 1 tablet (25 mg total) by mouth daily.  . metoprolol tartrate (LOPRESSOR) 25 MG tablet Take 0.5 tablets (12.5 mg total) by mouth 2 (two) times daily.  . Misc Natural Products (NEURIVA PO) Take 1 tablet by mouth daily in the afternoon.  . nitroGLYCERIN (NITROSTAT) 0.4 MG SL tablet Place 1 tablet (0.4 mg total) under the tongue every 5 (five) minutes x 3 doses as needed for chest pain.  Marland Kitchen terazosin (HYTRIN) 5 MG capsule Take 1 capsule (5 mg total) by mouth daily in the afternoon.  . traZODone (DESYREL) 50 MG tablet Take 1 tablet (50 mg total) by mouth at bedtime.  . Study - SOS-AMI - selatogrel 16 mg/0.5 mL or placebo SQ injection (PI-Christopher) Inject 0.5 mLs (16 mg total) into the skin once for 1 dose. Inject 0.5 mL subcutaneously in the abdomen or thigh if you experience  symptoms of a heart attack. Call 911 immediately  and seek emergency medical support. (Patient taking differently: Inject 16 mg into the skin as needed. Inject 0.5 mL subcutaneously in the abdomen or thigh if you experience  symptoms of a heart attack. Call 911 immediately  and seek emergency medical support.)   No facility-administered encounter medications on file as of 06/21/2020.    Allergies (verified) Patient has no known allergies.   History: Past Medical History:  Diagnosis Date  . Anxiety   . Chronic lower back pain   . Coronary artery disease    DES to RCA 2005, DES  to RCA 2017, DES x2 to CTO LAD 2017  . Depression   . Hyperlipidemia   . Hypertension   . Myocardial infarct ([place]) 01/27/2004  . Type 2 diabetes mellitus ([place])   . Type II diabetes mellitus ([place])    Past Surgical History:  Procedure Laterality Date  . BACK SURGERY    . CARDIAC CATHETERIZATION N/A 03/30/2015   Procedure: Left Heart Cath and Coronary Angiography;  Surgeon: Jettie Booze, MD;  Location: [facility] [facility];  Service: Cardiovascular;  Laterality: N/A;  . CARDIAC CATHETERIZATION  03/30/2015    Procedure: Coronary Stent Intervention;  Surgeon: Jettie Booze, MD;  Location: [place] [facility];  Service: Cardiovascular;;  . CARDIAC CATHETERIZATION N/A 04/14/2015   Procedure: Coronary/Bypass Graft CTO Intervention;  Surgeon: Jettie Booze, MD;  Location: [place] [facility];  Service: Cardiovascular;  Laterality: N/A;  . CARDIAC CATHETERIZATION  04/14/2015   Procedure: Left Heart Cath and Coronary Angiography;  Surgeon: Jettie Booze, MD;  Location: [place] [facility];  Service: Cardiovascular;;  . CATARACT EXTRACTION W/PHACO Right 12/03/2012   Procedure: CATARACT EXTRACTION RIGHT EYE (WITH PHACO) AND INTRAOCULAR LENS PLACEMENT  CDE=2.41;  Surgeon: Elta Guadeloupe [name], MD;  Location: [facility];  Service: Ophthalmology;  Laterality: Right;  . CATARACT EXTRACTION W/PHACO Left 12/17/2012   Procedure: CATARACT EXTRACTION PHACO AND INTRAOCULAR LENS PLACEMENT (IOC);  Surgeon: Elta Guadeloupe [name], MD;  Location: [facility];  Service: Ophthalmology;  Laterality: Left;  CDE:7.82  . COLON SURGERY N/A    Phreesia 01/11/2020  . COLONOSCOPY  2011   Dr. Gala Romney: diverticulosis, tubular adenomas  . COLONOSCOPY WITH PROPOFOL N/A 07/24/2016   Rourk: 3 tubular adenomas removed.  Diverticulosis.  3-year surveillance colonoscopy recommended.  . COLONOSCOPY WITH PROPOFOL N/A 12/15/2019   Procedure: COLONOSCOPY WITH PROPOFOL;  Surgeon: Daneil Dolin, MD;  [facility];  Service: Endoscopy;  Laterality: N/A;  8:30am  . CORONARY ANGIOPLASTY WITH STENT PLACEMENT  2005  . CORONARY STENT INTERVENTION N/A 04/12/2020   Procedure: CORONARY STENT INTERVENTION;  Surgeon: Sherren Mocha, MD;  Location: [place] [facility];  Service: Cardiovascular;  Laterality: N/A;  . CORONARY STENT PLACEMENT  03/30/2015   RCA    DES  . EYE SURGERY N/A    Phreesia 01/11/2020  . HERNIA REPAIR    . INTRAVASCULAR IMAGING/OCT N/A 04/12/2020   Procedure: INTRAVASCULAR IMAGING/OCT;  Surgeon: Sherren Mocha, MD;  Location: [place] [facility];  Service: Cardiovascular;  Laterality: N/A;  . KNEE ARTHROSCOPY Left 2012  . LEFT HEART CATH AND CORONARY ANGIOGRAPHY N/A 04/12/2020   Procedure: LEFT HEART CATH AND CORONARY ANGIOGRAPHY;  Surgeon: Sherren Mocha, MD;  Location: [place] [facility];  Service: Cardiovascular;  Laterality: N/A;  . [place]   "herniated disc"  . POLYPECTOMY  07/24/2016   Procedure: POLYPECTOMY;  Surgeon: Daneil Dolin, MD;  [facility];  Service: Endoscopy;;  ascending colon polyp, splenic flexure polyps times 2  . POLYPECTOMY  12/15/2019   Procedure: POLYPECTOMY;  Surgeon: Daneil Dolin, MD;  [facility];  Service: Endoscopy;;  . UMBILICAL HERNIA REPAIR  1990s  . YAG LASER APPLICATION Right [id number]/[id number]   Procedure: YAG LASER APPLICATION;  Surgeon: Elta Guadeloupe [name], MD;  Location: [facility];  Service: Ophthalmology;  Laterality: Right;   Family History  Problem Relation Age of Onset  . Colon cancer Mother        deceased age 22, diagnosed with CRC in  83s.   Social History   Socioeconomic History  . Marital status: Widowed    Spouse name: Not on file  . Number of children: Not on file  . Years of education: Not on file  . Highest education level: Not on file  Occupational History  . Not on file  Tobacco Use  . Smoking status: Former Smoker    Packs/day: 0.10    Years: 10.00    Pack years: 1.00    Types: Cigarettes    Quit date: 01/31/2004    Years since quitting: 16.4  . Smokeless tobacco: Never Used  Vaping Use  . Vaping Use: Never used  Substance and Sexual Activity  . Alcohol use: No    Alcohol/week: 0.0 standard drinks  . Drug use: No  . Sexual activity: Never  Other Topics Concern  . Not on file  Social History Narrative  . Not on file   Social Determinants of Health   Financial Resource Strain: Low Risk   . Difficulty of Paying Living Expenses: Not hard at all  Food Insecurity: No Food Insecurity  . Worried About Ship broker in the Last Year: Never true  . Ran Out of Food in the Last Year: Never true  Transportation Needs: No Transportation Needs  . Lack of Transportation (Medical): No  . Lack of Transportation (Non-Medical): No  Physical Activity: Insufficiently Active  . Days of Exercise per Week: 5 days  . Minutes of Exercise per Session: 20 min  Stress: Not on file  Social Connections: Moderately Isolated  . Frequency of Communication with Friends and Family: More than three times a week  . Frequency of Social Gatherings with Friends and Family: More than three times a week  . Attends Religious Services: 1 to 4 times per year  . Active Member of Clubs or Organizations: No  . Attends Archivist Meetings: Never  . Marital Status: Widowed    Tobacco Counseling Counseling given: Not Answered   Clinical Intake:  Pre-visit preparation completed: No  Pain : No/denies pain Pain Score: 0-No pain     Nutritional Status: BMI > 30  Obese Diabetes: Yes CBG done?: No Did pt. bring in CBG monitor from home?: No  What is the last grade level you completed in school?: 12 th grade  Diabetic? Yes- checks glucose daily   Interpreter Needed?: No      Activities of Daily Living In your present state of health, do you have any difficulty performing the following activities: 06/21/2020 04/12/2020  Hearing? N N  Vision? N N  Difficulty concentrating or making decisions? N N  Walking or climbing stairs? N N  Dressing or bathing? N N  Doing errands, shopping? N N  Preparing Food and eating ? N -  Using the Toilet? N -  In the past six months, have you accidently leaked urine? N -  Do you have problems with loss of bowel control? N -  Managing your Medications? N -  Managing your Finances? N -  Housekeeping or managing your Housekeeping? N -  Some recent data might be hidden    Patient Care Team: [name], NP as PCP - General (Nurse Practitioner) Fay Records, MD as PCP -  Cardiology (Cardiology) Fay Records, MD as Attending Physician (Cardiology) Gala Romney Cristopher Estimable, MD as Consulting Physician (Gastroenterology)  Indicate any recent Medical Services you may have received from other than [facility] providers in the past year (date may be  approximate).     Assessment:   This is a routine wellness examination for [facility].  Hearing/Vision screen No exam data present  Dietary issues and exercise activities discussed: Current Exercise Habits: The patient has a physically strenuous job, but has no regular exercise apart from work.  Goals Addressed   None    Depression Screen PHQ 2/9 Scores 06/21/2020 06/01/2020 04/23/2020 02/23/2020 01/12/2020  PHQ - 2 Score 0 1 2 0 2  PHQ- 9 Score - 2 5 1 3     Fall Risk Fall Risk  06/21/2020 06/01/2020 04/23/2020 02/23/2020 01/12/2020  Falls in the past year? 0 0 0 0 0  Number falls in past yr: 0 0 0 0 0  Injury with Fall? 0 0 0 0 0  Risk for fall due to : - No Fall Risks No Fall Risks No Fall Risks No Fall Risks  Follow up - Falls evaluation completed Falls evaluation completed Falls evaluation completed Falls evaluation completed    FALL RISK PREVENTION PERTAINING TO THE HOME:  Any stairs in or around the home? No  If so, are there any without handrails? No  Home free of loose throw rugs in walkways, pet beds, electrical cords, etc? Yes  Adequate lighting in your home to reduce risk of falls? Yes   ASSISTIVE DEVICES UTILIZED TO PREVENT FALLS:  Life alert? No  Use of a cane, walker or w/c? Yes   Cane Grab bars in the bathroom? Yes  Shower chair or bench in shower? No  Elevated toilet seat or a handicapped toilet? No   TIMED UP AND GO:  Was the test performed? Yes .  Length of time to ambulate 10 feet: 10 sec.   Gait slow and steady without use of assistive device  Cognitive Function:     6CIT Screen 06/21/2020  What Year? 0 points  What month? 0 points  What time? 0 points  Count back from 20 0 points  Months in  reverse 0 points  Repeat phrase 0 points  Total Score 0    Immunizations Immunization History  Administered Date(s) Administered  . Influenza-Unspecified 11/06/2017  . Moderna Sars-Covid-2 Vaccination 03/14/2019, 04/12/2019  . Td 03/21/2011  . Zoster Recombinat (Shingrix) 01/12/2020, 04/23/2020    TDAP status: Up to date  Flu Vaccine status: Up to date  Pneumococcal vaccine status: Completed during today's visit.  Covid-19 vaccine status: Completed vaccines  Qualifies for Shingles Vaccine? Yes   Zostavax completed Yes   Shingrix Completed?: Yes  Screening Tests Health Maintenance  Topic Date Due  . FOOT EXAM  Never done  . PNA vac Low Risk Adult (1 of 2 - PCV13) Never done  . COVID-19 Vaccine (3 - Booster for Moderna series) 09/12/2019  . OPHTHALMOLOGY EXAM  08/13/2020  . INFLUENZA VACCINE  09/06/2020  . HEMOGLOBIN A1C  10/13/2020  . TETANUS/TDAP  03/20/2021  . COLONOSCOPY (Pts 45-72yrs Insurance coverage will need to be confirmed)  12/14/2029  . Hepatitis C Screening  Completed  . HPV VACCINES  Aged Out    Health Maintenance  Health Maintenance Due  Topic Date Due  . FOOT EXAM  Never done  . PNA vac Low Risk Adult (1 of 2 - PCV13) Never done  . COVID-19 Vaccine (3 - Booster for Moderna series) 09/12/2019    Colorectal cancer screening: No longer required.   Lung Cancer Screening: (Low Dose CT Chest recommended if Age 46-80 years, 30 pack-year currently smoking OR have quit w/in 15years.) does not qualify.  Lung Cancer Screening Referral: doesn't qualify   Additional Screening:  Hepatitis C Screening: does qualify; Completed   Vision Screening: Recommended annual ophthalmology exams for early detection of glaucoma and other disorders of the eye. Is the patient up to date with their annual eye exam?  Yes  Who is the provider or what is the name of the office in which the patient attends annual eye exams? Dr Gershon Crane  If pt is not established with a  provider, would they like to be referred to a provider to establish care? No .   Dental Screening: Recommended annual dental exams for proper oral hygiene  Community Resource Referral / Chronic Care Management: CRR required this visit?  No   CCM required this visit?  No      Plan:     I have personally reviewed and noted the following in the patient's chart:   . Medical and social history . Use of alcohol, tobacco or illicit drugs  . Current medications and supplements including opioid prescriptions. Patient is currently taking opioid prescriptions. Information provided to patient regarding non-opioid alternatives. Patient advised to discuss non-opioid treatment plan with their provider. . Functional ability and status . Nutritional status . Physical activity . Advanced directives . List of other physicians . Hospitalizations, surgeries, and ER visits in previous 12 months . Vitals . Screenings to include cognitive, depression, and falls . Referrals and appointments  In addition, I have reviewed and discussed with patient certain preventive protocols, quality metrics, and best practice recommendations. A written personalized care plan for preventive services as well as general preventive health recommendations were provided to patient.     Kate Sable, LPN, LPN   [id number]   Nurse Notes: Visit performed in office- supervising provider Dr Posey Pronto in office. Time spent with pt 30 mins

## 2020-07-08 ENCOUNTER — Other Ambulatory Visit: Payer: Self-pay | Admitting: Nurse Practitioner

## 2020-07-08 ENCOUNTER — Telehealth: Payer: Self-pay

## 2020-07-08 MED ORDER — HYDROCODONE-ACETAMINOPHEN 10-325 MG PO TABS: 1.0000 | ORAL_TABLET | Freq: Four times a day (QID) | ORAL | 0 refills | Status: AC | PRN

## 2020-07-08 NOTE — Telephone Encounter (Signed)
Patient called requesting a refill on Hydrocodone send to [facility] in [place]

## 2020-07-08 NOTE — Telephone Encounter (Signed)
Sent!

## 2020-07-08 NOTE — Telephone Encounter (Signed)
Pt informed

## 2020-07-19 ENCOUNTER — Telehealth: Payer: Self-pay | Admitting: *Deleted

## 2020-07-19 DIAGNOSIS — E119 Type 2 diabetes mellitus without complications: Secondary | ICD-10-CM | POA: Diagnosis not present

## 2020-07-19 DIAGNOSIS — I10 Essential (primary) hypertension: Secondary | ICD-10-CM | POA: Diagnosis not present

## 2020-07-19 NOTE — Telephone Encounter (Signed)
Called pt to see if he had eye exam and where if not can schedule for DRS in office ---LVM

## 2020-07-20 LAB — LIPID PANEL WITH LDL/HDL RATIO
Cholesterol, Total: 139 mg/dL (ref 100–199)
HDL: 39 mg/dL — ABNORMAL LOW (ref >39)
LDL Chol Calc (NIH): 77 mg/dL (ref 0–99)
LDL/HDL Ratio: 2 ratio (ref 0.0–3.6)
Triglycerides: 132 mg/dL (ref 0–149)
VLDL Cholesterol Cal: 23 mg/dL (ref 5–40)

## 2020-07-20 LAB — CMP14+EGFR
ALT: 19 IU/L (ref 0–44)
AST: 15 IU/L (ref 0–40)
Albumin/Globulin Ratio: 1.5 (ref 1.2–2.2)
Albumin: 4.1 g/dL (ref 3.7–4.7)
Alkaline Phosphatase: 40 IU/L — ABNORMAL LOW (ref 44–121)
BUN/Creatinine Ratio: 11 (ref 10–24)
BUN: 11 mg/dL (ref 8–27)
Bilirubin Total: 0.6 mg/dL (ref 0.0–1.2)
CO2: 27 mmol/L (ref 20–29)
Calcium: 8.7 mg/dL (ref 8.6–10.2)
Chloride: 102 mmol/L (ref 96–106)
Creatinine, Ser: 0.98 mg/dL (ref 0.76–1.27)
Globulin, Total: 2.8 g/dL (ref 1.5–4.5)
Glucose: 81 mg/dL (ref 65–99)
Potassium: 4.4 mmol/L (ref 3.5–5.2)
Sodium: 144 mmol/L (ref 134–144)
Total Protein: 6.9 g/dL (ref 6.0–8.5)
eGFR: 81 mL/min/{1.73_m2} (ref >59)

## 2020-07-20 LAB — CBC WITH DIFFERENTIAL/PLATELET
Basophils Absolute: 0 10*3/uL (ref 0.0–0.2)
Basos: 1 %
EOS (ABSOLUTE): 0.3 10*3/uL (ref 0.0–0.4)
Eos: 4 %
Hematocrit: 37.1 % — ABNORMAL LOW (ref 37.5–51.0)
Hemoglobin: 12.2 g/dL — ABNORMAL LOW (ref 13.0–17.7)
Immature Grans (Abs): 0 10*3/uL (ref 0.0–0.1)
Immature Granulocytes: 0 %
Lymphocytes Absolute: 3.5 10*3/uL — ABNORMAL HIGH (ref 0.7–3.1)
Lymphs: 41 %
MCH: 30.6 pg (ref 26.6–33.0)
MCHC: 32.9 g/dL (ref 31.5–35.7)
MCV: 93 fL (ref 79–97)
Monocytes Absolute: 0.8 10*3/uL (ref 0.1–0.9)
Monocytes: 9 %
Neutrophils Absolute: 3.9 10*3/uL (ref 1.4–7.0)
Neutrophils: 45 %
Platelets: 233 10*3/uL (ref 150–450)
RBC: 3.99 x10E6/uL — ABNORMAL LOW (ref 4.14–5.80)
RDW: 13.6 % (ref 11.6–15.4)
WBC: 8.5 10*3/uL (ref 3.4–10.8)

## 2020-07-20 LAB — HEMOGLOBIN A1C
Est. average glucose Bld gHb Est-mCnc: 131 mg/dL
Hgb A1c MFr Bld: 6.2 % — ABNORMAL HIGH (ref 4.8–5.6)

## 2020-07-20 NOTE — Progress Notes (Signed)
Labs look great. Anemia is improving.

## 2020-07-21 LAB — MICROALBUMIN / CREATININE URINE RATIO
Creatinine, Urine: 200.9 mg/dL
Microalb/Creat Ratio: 3 mg/g creat (ref 0–29)
Microalbumin, Urine: 6.3 ug/mL

## 2020-07-22 ENCOUNTER — Ambulatory Visit (INDEPENDENT_AMBULATORY_CARE_PROVIDER_SITE_OTHER): Payer: Medicare Other | Admitting: Nurse Practitioner

## 2020-07-22 ENCOUNTER — Encounter: Payer: Self-pay | Admitting: Nurse Practitioner

## 2020-07-22 ENCOUNTER — Other Ambulatory Visit: Payer: Self-pay

## 2020-07-22 VITALS — BP 133/77 | HR 64 | Temp 98.6°F | Resp 16 | Ht 69.0 in | Wt 301.1 lb

## 2020-07-22 DIAGNOSIS — M545 Low back pain, unspecified: Secondary | ICD-10-CM | POA: Diagnosis not present

## 2020-07-22 DIAGNOSIS — E119 Type 2 diabetes mellitus without complications: Secondary | ICD-10-CM | POA: Diagnosis not present

## 2020-07-22 DIAGNOSIS — E785 Hyperlipidemia, unspecified: Secondary | ICD-10-CM

## 2020-07-22 DIAGNOSIS — G8929 Other chronic pain: Secondary | ICD-10-CM

## 2020-07-22 DIAGNOSIS — D649 Anemia, unspecified: Secondary | ICD-10-CM | POA: Diagnosis not present

## 2020-07-22 DIAGNOSIS — Z79899 Other long term (current) drug therapy: Secondary | ICD-10-CM | POA: Diagnosis not present

## 2020-07-22 DIAGNOSIS — I10 Essential (primary) hypertension: Secondary | ICD-10-CM | POA: Diagnosis not present

## 2020-07-22 NOTE — Assessment & Plan Note (Signed)
-  takes hydrocodone-APAP for chronic back pain -will get UDS today

## 2020-07-22 NOTE — Assessment & Plan Note (Signed)
Lab Results  Component Value Date   HGBA1C 6.2 (H) 07/19/2020  -goal < 7, so he is at goal -continue current meds -on statin and ARB

## 2020-07-22 NOTE — Assessment & Plan Note (Signed)
BP Readings from Last 3 Encounters:  07/22/20 133/77  06/01/20 126/84  04/23/20 127/78   -well controlled without adverse med effects -continue current meds

## 2020-07-22 NOTE — Assessment & Plan Note (Signed)
Lab Results  Component Value Date   CHOL 139 07/19/2020   HDL 39 (L) 07/19/2020   LDLCALC 77 07/19/2020   TRIG 132 07/19/2020   CHOLHDL 3.7 04/12/2020   -LDL 77; goal < 70 d/t cardiac hx -we discussed repatha, and he declined today -he may bring this up with cardiology

## 2020-07-22 NOTE — Assessment & Plan Note (Signed)
Lab Results  Component Value Date   WBC 8.5 07/19/2020   HGB 12.2 (L) 07/19/2020   HCT 37.1 (L) 07/19/2020   MCV 93 07/19/2020   PLT 233 07/19/2020  -has mild anemia at baseline

## 2020-07-22 NOTE — Progress Notes (Signed)
Established Patient Office Visit  Subjective:  Patient ID: Steven Stevens, male    DOB: 25-Oct-1945  Age: 75 y.o. MRN: [id number]  CC:  Chief Complaint  Patient presents with   Diabetes    3 month follow up    Ear Fullness    Left ear feels like he has water in it for the past 2-3 months     HPI Steven Stevens presents for lab follow-up.  He has fullness in his left ear for several months.  He states it comes and goes.  He takes opioids for chronic back pain. He states his back hurts at 6/10 and ahs been hurting for the last 5 days.   Past Medical History:  Diagnosis Date   Anxiety    Chronic lower back pain    Coronary artery disease    DES to RCA 2005, DES to RCA 2017, DES x2 to CTO LAD 2017   Depression    Hyperlipidemia    Hypertension    Myocardial infarct ([place]) 01/27/2004   Type 2 diabetes mellitus ([place])    Type II diabetes mellitus ([place])     Past Surgical History:  Procedure Laterality Date   BACK SURGERY     CARDIAC CATHETERIZATION N/A 03/30/2015   Procedure: Left Heart Cath and Coronary Angiography;  Surgeon: Jettie Booze, MD;  Location: [facility];  Service: Cardiovascular;  Laterality: N/A;   CARDIAC CATHETERIZATION  03/30/2015   Procedure: Coronary Stent Intervention;  Surgeon: Jettie Booze, MD;  Location: [facility];  Service: Cardiovascular;;   CARDIAC CATHETERIZATION N/A 04/14/2015   Procedure: Coronary/Bypass Graft CTO Intervention;  Surgeon: Jettie Booze, MD;  Location: [facility];  Service: Cardiovascular;  Laterality: N/A;   CARDIAC CATHETERIZATION  04/14/2015   Procedure: Left Heart Cath and Coronary Angiography;  Surgeon: Jettie Booze, MD;  Location: [facility];  Service: Cardiovascular;;   CATARACT EXTRACTION W/PHACO Right 12/03/2012   Procedure: CATARACT EXTRACTION RIGHT EYE (WITH PHACO) AND INTRAOCULAR LENS PLACEMENT  CDE=2.41;  Surgeon: Elta Guadeloupe [name], MD;  Location: [facility];  Service:  Ophthalmology;  Laterality: Right;   CATARACT EXTRACTION W/PHACO Left 12/17/2012   Procedure: CATARACT EXTRACTION PHACO AND INTRAOCULAR LENS PLACEMENT (IOC);  Surgeon: Elta Guadeloupe [name], MD;  Location: [facility];  Service: Ophthalmology;  Laterality: Left;  CDE:7.82   COLON SURGERY N/A    Phreesia 01/11/2020   COLONOSCOPY  2011   Dr. Gala Romney: diverticulosis, tubular adenomas   COLONOSCOPY WITH PROPOFOL N/A 07/24/2016   Rourk: 3 tubular adenomas removed.  Diverticulosis.  3-year surveillance colonoscopy recommended.   COLONOSCOPY WITH PROPOFOL N/A 12/15/2019   Procedure: COLONOSCOPY WITH PROPOFOL;  Surgeon: Daneil Dolin, MD;  [facility];  Service: Endoscopy;  Laterality: N/A;  8:30am   CORONARY ANGIOPLASTY WITH STENT PLACEMENT  2005   CORONARY STENT INTERVENTION N/A 04/12/2020   Procedure: CORONARY STENT INTERVENTION;  Surgeon: Sherren Mocha, MD;  Location: [facility];  Service: Cardiovascular;  Laterality: N/A;   CORONARY STENT PLACEMENT  03/30/2015   RCA    DES   EYE SURGERY N/A    Phreesia 01/11/2020   HERNIA REPAIR     INTRAVASCULAR IMAGING/OCT N/A 04/12/2020   Procedure: INTRAVASCULAR IMAGING/OCT;  Surgeon: Sherren Mocha, MD;  Location: [facility];  Service: Cardiovascular;  Laterality: N/A;   KNEE ARTHROSCOPY Left 2012   LEFT HEART CATH AND CORONARY ANGIOGRAPHY N/A 04/12/2020   Procedure: LEFT HEART  CATH AND CORONARY ANGIOGRAPHY;  Surgeon: Sherren Mocha, MD;  Location: [facility];  Service: Cardiovascular;  Laterality: N/A;   [place]   "herniated disc"   POLYPECTOMY  07/24/2016   Procedure: POLYPECTOMY;  Surgeon: Daneil Dolin, MD;  [facility];  Service: Endoscopy;;  ascending colon polyp, splenic flexure polyps times 2   POLYPECTOMY  12/15/2019   Procedure: POLYPECTOMY;  Surgeon: Daneil Dolin, MD;  [facility];  Service: Endoscopy;;   UMBILICAL HERNIA REPAIR  [id number]   YAG LASER APPLICATION Right  57/02/7791   Procedure: YAG LASER APPLICATION;  Surgeon: Elta Guadeloupe [name], MD;  Location: [facility];  Service: Ophthalmology;  Laterality: Right;    Family History  Problem Relation Age of Onset   Colon cancer Mother        deceased age 93, diagnosed with CRC in 21s.    Social History   Socioeconomic History   Marital status: Widowed    Spouse name: Not on file   Number of children: Not on file   Years of education: Not on file   Highest education level: Not on file  Occupational History   Not on file  Tobacco Use   Smoking status: Former    Packs/day: 0.10    Years: 10.00    Pack years: 1.00    Types: Cigarettes    Quit date: 01/31/2004    Years since quitting: 16.4   Smokeless tobacco: Never  Vaping Use   Vaping Use: Never used  Substance and Sexual Activity   Alcohol use: No    Alcohol/week: 0.0 standard drinks   Drug use: No   Sexual activity: Never  Other Topics Concern   Not on file  Social History Narrative   Not on file   Social Determinants of Health   Financial Resource Strain: Low Risk    Difficulty of Paying Living Expenses: Not hard at all  Food Insecurity: No Food Insecurity   Worried About Charity fundraiser in the Last Year: Never true   [place] in the Last Year: Never true  Transportation Needs: No Transportation Needs   Lack of Transportation (Medical): No   Lack of Transportation (Non-Medical): No  Physical Activity: Insufficiently Active   Days of Exercise per Week: 5 days   Minutes of Exercise per Session: 20 min  Stress: Not on file  Social Connections: Moderately Isolated   Frequency of Communication with Friends and Family: More than three times a week   Frequency of Social Gatherings with Friends and Family: More than three times a week   Attends Religious Services: 1 to 4 times per year   Active Member of [employer] or Organizations: No   Attends Archivist Meetings: Never   Marital Status: Widowed  Intimate Partner  Violence: Not on file    Outpatient Medications Prior to Visit  Medication Sig Dispense Refill   acetaminophen (TYLENOL) 500 MG tablet Take 250 mg by mouth daily.     ALPRAZolam (XANAX) 0.5 MG tablet Take 1 tablet (0.5 mg total) by mouth in the morning, at noon, and at bedtime. 90 tablet 2   aspirin EC 81 MG tablet Take 81 mg by mouth daily.     atorvastatin (LIPITOR) 80 MG tablet Take 1 tablet (80 mg total) by mouth daily. 30 tablet 6   clopidogrel (PLAVIX) 75 MG tablet Take 1 tablet (75 mg total) by mouth daily. 90 tablet 3  escitalopram (LEXAPRO) 10 MG tablet Take 1 tablet (10 mg total) by mouth daily in the afternoon. 30 tablet 3   glipiZIDE-metformin (METAGLIP) 2.5-500 MG tablet TAKE 2 TABLETS BY MOUTH IN THE MORNING AND 1 TABLET AT NIGHT 270 tablet 1   HYDROcodone-acetaminophen (NORCO) 10-325 MG tablet Take 1 tablet by mouth every 6 (six) hours as needed for moderate pain. 120 tablet 0   hydrocortisone (ANUSOL-HC) 2.5 % rectal cream Place 1 application rectally 4 (four) times daily as needed (hemorrhoids/discomfort).      LINZESS 145 MCG CAPS capsule Take 145 mcg by mouth daily as needed (constipation.).      losartan (COZAAR) 25 MG tablet Take 1 tablet (25 mg total) by mouth daily. 90 tablet 0   metoprolol tartrate (LOPRESSOR) 25 MG tablet Take 0.5 tablets (12.5 mg total) by mouth 2 (two) times daily. 90 tablet 3   Misc Natural Products (NEURIVA PO) Take 1 tablet by mouth daily in the afternoon.     nitroGLYCERIN (NITROSTAT) 0.4 MG SL tablet Place 1 tablet (0.4 mg total) under the tongue every 5 (five) minutes x 3 doses as needed for chest pain. 25 tablet 3   terazosin (HYTRIN) 5 MG capsule Take 1 capsule (5 mg total) by mouth daily in the afternoon. 90 capsule 1   traZODone (DESYREL) 50 MG tablet Take 1 tablet (50 mg total) by mouth at bedtime. 90 tablet 1   Study - [facility] - selatogrel 16 mg/0.5 mL or placebo SQ injection ([facility]) Inject 0.5 mLs (16 mg total) into the skin once  for 1 dose. Inject 0.5 mL subcutaneously in the abdomen or thigh if you experience  symptoms of a heart attack. Call 911 immediately  and seek emergency medical support. (Patient taking differently: Inject 16 mg into the skin as needed. Inject 0.5 mL subcutaneously in the abdomen or thigh if you experience  symptoms of a heart attack. Call 911 immediately  and seek emergency medical support.) 0.5 mL 0   No facility-administered medications prior to visit.    No Known Allergies  ROS Review of Systems  Constitutional: Negative.   Respiratory: Negative.    Cardiovascular: Negative.   Musculoskeletal:  Positive for back pain.       Chronic pain  Psychiatric/Behavioral: Negative.       Objective:    Physical Exam Constitutional:      Appearance: Normal appearance.  Cardiovascular:     Rate and Rhythm: Normal rate and regular rhythm.     Pulses: Normal pulses.     Heart sounds: Normal heart sounds.  Pulmonary:     Effort: Pulmonary effort is normal.     Breath sounds: Normal breath sounds.  Musculoskeletal:     Comments: Uses cane for ambulation  Neurological:     Mental Status: He is alert.  Psychiatric:        Mood and Affect: Mood normal.        Behavior: Behavior normal.        Thought Content: Thought content normal.        Judgment: Judgment normal.    BP 133/77 (BP Location: Right Arm, Patient Position: Sitting)   Pulse 64   Temp 98.6 F (37 C) (Oral)   Resp 16   Ht '5\' 9"'  (1.753 m)   Wt (!) 301 lb 1.9 oz (136.6 kg)   SpO2 92%   BMI 44.47 kg/m  Wt Readings from Last 3 Encounters:  07/22/20 (!) 301 lb 1.9 oz (136.6 kg)  06/01/20  297 lb (134.7 kg)  04/23/20 (!) 304 lb (137.9 kg)     Health Maintenance Due  Topic Date Due   FOOT EXAM  Never done    There are no preventive care reminders to display for this patient.  Lab Results  Component Value Date   TSH 1.966 04/12/2020   Lab Results  Component Value Date   WBC 8.5 07/19/2020   HGB 12.2 (L)  07/19/2020   HCT 37.1 (L) 07/19/2020   MCV 93 07/19/2020   PLT 233 07/19/2020   Lab Results  Component Value Date   NA 144 07/19/2020   K 4.4 07/19/2020   CO2 27 07/19/2020   GLUCOSE 81 07/19/2020   BUN 11 07/19/2020   CREATININE 0.98 07/19/2020   BILITOT 0.6 07/19/2020   ALKPHOS 40 (L) 07/19/2020   AST 15 07/19/2020   ALT 19 07/19/2020   PROT 6.9 07/19/2020   ALBUMIN 4.1 07/19/2020   CALCIUM 8.7 07/19/2020   ANIONGAP 8 04/13/2020   EGFR 81 07/19/2020   Lab Results  Component Value Date   CHOL 139 07/19/2020   Lab Results  Component Value Date   HDL 39 (L) 07/19/2020   Lab Results  Component Value Date   LDLCALC 77 07/19/2020   Lab Results  Component Value Date   TRIG 132 07/19/2020   Lab Results  Component Value Date   CHOLHDL 3.7 04/12/2020   Lab Results  Component Value Date   HGBA1C 6.2 (H) 07/19/2020      Assessment & Plan:   Problem List Items Addressed This Visit       Cardiovascular and Mediastinum   Essential hypertension    BP Readings from Last 3 Encounters:  07/22/20 133/77  06/01/20 126/84  04/23/20 127/78  -well controlled without adverse med effects -continue current meds       Relevant Orders   CBC with Differential/Platelet   CMP14+EGFR   Lipid Panel With LDL/HDL Ratio     Endocrine   Type 2 diabetes mellitus treated without insulin (HCC)    Lab Results  Component Value Date   HGBA1C 6.2 (H) 07/19/2020  -goal < 7, so he is at goal -continue current meds -on statin and ARB        Relevant Orders   CBC with Differential/Platelet   CMP14+EGFR   Lipid Panel With LDL/HDL Ratio   Hemoglobin A1c     Other   Dyslipidemia    Lab Results  Component Value Date   CHOL 139 07/19/2020   HDL 39 (L) 07/19/2020   LDLCALC 77 07/19/2020   TRIG 132 07/19/2020   CHOLHDL 3.7 04/12/2020  -LDL 77; goal < 70 d/t cardiac hx -we discussed repatha, and he declined today -he may bring this up with cardiology        Relevant  Orders   Lipid Panel With LDL/HDL Ratio   Normocytic anemia    Lab Results  Component Value Date   WBC 8.5 07/19/2020   HGB 12.2 (L) 07/19/2020   HCT 37.1 (L) 07/19/2020   MCV 93 07/19/2020   PLT 233 07/19/2020  -has mild anemia at baseline        Relevant Orders   CBC with Differential/Platelet   Chronic back pain   Relevant Orders   Drug Screen 12+Alcohol+CRT, Ur   High risk medication use - Primary    -takes hydrocodone-APAP for chronic back pain -will get UDS today       Relevant Orders   Drug Screen  12+Alcohol+CRT, Ur    No orders of the defined types were placed in this encounter.   Follow-up: Return in about 3 months (around 10/22/2020) for Lab follow-up (DM, HLD, HTN).    [name], NP

## 2020-07-22 NOTE — Patient Instructions (Signed)
Please have fasting labs drawn 2-3 days prior to your appointment so we can discuss the results during your office visit.  

## 2020-07-31 LAB — DRUG SCREEN 12+ALCOHOL+CRT, UR
Amphetamines, Urine: NEGATIVE ng/mL
BENZODIAZ UR QL: POSITIVE ng/mL — AB
Barbiturate: NEGATIVE ng/mL
Cannabinoids: NEGATIVE ng/mL
Cocaine (Metabolite): NEGATIVE ng/mL
Creatinine, Urine: 282.2 mg/dL (ref 20.0–300.0)
Ethanol, Urine: NEGATIVE %
Meperidine: NEGATIVE ng/mL
Methadone: NEGATIVE ng/mL
OPIATE SCREEN URINE: POSITIVE ng/mL — AB
Oxycodone/Oxymorphone, Urine: NEGATIVE ng/mL
Phencyclidine: NEGATIVE ng/mL
Propoxyphene: NEGATIVE ng/mL
Tramadol: NEGATIVE ng/mL

## 2020-08-01 NOTE — Progress Notes (Signed)
Appropriate drug screen.

## 2020-08-05 ENCOUNTER — Other Ambulatory Visit: Payer: Self-pay | Admitting: *Deleted

## 2020-08-05 DIAGNOSIS — F419 Anxiety disorder, unspecified: Secondary | ICD-10-CM

## 2020-08-05 MED ORDER — ESCITALOPRAM OXALATE 10 MG PO TABS: 10.0000 mg | ORAL_TABLET | Freq: Every day | ORAL | 3 refills | Status: DC

## 2020-08-10 ENCOUNTER — Telehealth: Payer: Self-pay

## 2020-08-10 ENCOUNTER — Other Ambulatory Visit: Payer: Self-pay | Admitting: Nurse Practitioner

## 2020-08-10 DIAGNOSIS — G8929 Other chronic pain: Secondary | ICD-10-CM

## 2020-08-10 MED ORDER — HYDROCODONE-ACETAMINOPHEN 10-325 MG PO TABS: 1.0000 | ORAL_TABLET | Freq: Four times a day (QID) | ORAL | 0 refills | Status: AC | PRN

## 2020-08-10 NOTE — Telephone Encounter (Signed)
sent 

## 2020-08-10 NOTE — Telephone Encounter (Signed)
Need med refill  HYDROcodone-acetaminophen (NORCO) 10-325 MG tablet   [facility]

## 2020-08-12 ENCOUNTER — Telehealth: Payer: Self-pay

## 2020-08-12 ENCOUNTER — Other Ambulatory Visit: Payer: Self-pay | Admitting: Nurse Practitioner

## 2020-08-12 ENCOUNTER — Other Ambulatory Visit: Payer: Self-pay | Admitting: *Deleted

## 2020-08-12 DIAGNOSIS — G4700 Insomnia, unspecified: Secondary | ICD-10-CM

## 2020-08-12 DIAGNOSIS — I10 Essential (primary) hypertension: Secondary | ICD-10-CM

## 2020-08-12 MED ORDER — LOSARTAN POTASSIUM 25 MG PO TABS: 25.0000 mg | ORAL_TABLET | Freq: Every day | ORAL | 0 refills | Status: DC

## 2020-08-12 MED ORDER — TRAZODONE HCL 50 MG PO TABS: 50.0000 mg | ORAL_TABLET | Freq: Every day | ORAL | 1 refills | Status: DC

## 2020-08-12 MED ORDER — ALPRAZOLAM 0.5 MG PO TABS: 0.5000 mg | ORAL_TABLET | Freq: Three times a day (TID) | ORAL | 2 refills | Status: DC

## 2020-08-12 NOTE — Telephone Encounter (Signed)
Trazadone, Aprozalom, :Lorsartan, please send to [facility]

## 2020-08-12 NOTE — Telephone Encounter (Signed)
Pt medication sent to pharmacy  

## 2020-08-12 NOTE — Telephone Encounter (Signed)
I'll send in alprazolam. You can send trazodone and losartan.

## 2020-08-12 NOTE — Telephone Encounter (Signed)
Ok to refill Trazodone and Alprazolam?

## 2020-08-23 ENCOUNTER — Encounter: Payer: Self-pay | Admitting: *Deleted

## 2020-08-23 DIAGNOSIS — Z006 Encounter for examination for normal comparison and control in clinical research program: Secondary | ICD-10-CM

## 2020-08-24 NOTE — Research (Signed)
[id number], [id number]     FOLLOW-UP PHONE CALLS  SUBJECT ID:  [id number] [name], RN  Trainer's signature: on Delegation of Authority Log Date of the Follow up call:   23-Aug-2020  Visit Number: 4 Start time of the Follow up call:    1055   End time of call: 1105  Patient states that he can still read the labels on both autoinjectors. I educated him on the need to keep them in a plastic bag to keep the writing from fading out. Patient voiced understanding of this practice. Mr. Bynum and his daughters have my contact information should they have any questions or concerns.    - CONTACT  Q1;  Was the follow up phone call done with the subject?  [x]   YES  []   NO           If NO, check the following:      Q2:    Was the follow up phone call done with a family member               Or caregiver?    [x]   YES   DAUGHTER PRESENT []   NO          Make note about reasons ___________________________________    - MEDICAL CONDITION      Q3:  Did any of the following occur?   []   Death  NONE     []   Hospitalization (any cause)         []   Use of autoinjector  Make note about the type of event, and when it occurred. In case of hospitalization: the Location of the hospital and/or the treating physician's contact details  ____________ ____________________________________________________________________ ____________________________________________________________________  Note: remember to report any SAE/AE which has occurred within 30 days after any  injection of the study drug on relevant eCRF forms.   Q4:  Did the subject develop any condition which is an          exclusion criterion?  []   YES  [x]   NO   Take note about the occurrence of any exclusion criterion after randomization: _________________________________________________________________ __________________________________________________________________  Q5: Was there any change in  subject's antithrombotic therapy?   []   YES  [x]   NO     Take not about any change of antithrombotic treatment: _______________________ ____________________________________________________________________ ____________________________________________________________________      Lezlie Octave OF THE STUDY-SPECFIC TRAINING  Q6: Did the subject correctly reply to the following questions?       A.  What are common heart attack symptoms?      [x]   YES   []   NO      B.  What has to be done in case any of those symptoms occurs? [x]   YES  []   NO      C.  What are the main steps to perform a self-injection?  [x]   YES  []   NO             If NO, then report which step/s was/were missing:        []   Choose injection site (abdomen or thigh)       []   Twist cap off       []   Pinch skin and place the study autoinjector       []   Firmly push down and hold for 3 seconds       D. What has to be done immediately after an injection?  [x]   YES   []   NO          If NO, then report which step/s was/were missing:       []   Call  for emergency medical help       []   Show the autoinjector to the emergency medical responder       E.  Does the subject recall where s/he keeps/ stores the autoinjectors? [x]  YES  []  NO          Note the place of storage and any corrective explanation if needed below __________________________________________________________________ __________________________________________________________________  - TRAINING REFRESHER   Q7:  Is a training refresher needed?      []  YES  [x]  NO        If YES, indicate items that have to be refreshed. More than one may apply:               []   Heart attack symptoms             []   Actions to be taken following heart attack symptoms             []   Steps to perform the self-injection and follow-up actions to be taken   []   Other, Specify  _________________________________________     Form Based on [facility]_CRF_Version 5.0_27Oct2021  [id number], [id number],            and eCRF  Version 5.0-  03-Dec-2019

## 2020-09-14 ENCOUNTER — Other Ambulatory Visit: Payer: Self-pay | Admitting: Nurse Practitioner

## 2020-09-14 ENCOUNTER — Telehealth: Payer: Self-pay

## 2020-09-14 MED ORDER — HYDROCODONE-ACETAMINOPHEN 10-325 MG PO TABS: 1.0000 | ORAL_TABLET | Freq: Four times a day (QID) | ORAL | 0 refills | Status: DC | PRN

## 2020-09-14 NOTE — Telephone Encounter (Signed)
sent 

## 2020-09-14 NOTE — Telephone Encounter (Signed)
Patient called need med refill HYDROcodone-acetaminophen ([place]) 10-325 MG tablet   [facility]

## 2020-09-15 NOTE — Progress Notes (Signed)
Cardiology Office Note   Date:  09/16/2020   ID:  Steven Stevens, DOB Feb 22, 1945, MRN [id number]  PCP:  [name], NP  Cardiologist:   [name], MD   F/U of CAD     History of Present Illness: Steven Stevens is a 75 y.o. male with a history of CAD He is s/p PTCA/ DES to RCA in 2005; s/p PTCA/DES to RCA in 2017; s/p PCI with DES to CTO  in March 2017)  Also he has a hstory of COPD, morbid obesity, HL and type II DM  In 2022 admitted with NSTEMI   Underwent cath with PTCA/Stent to LAD      I saw the pt back in March 2022   Since seen he denies CP   Breathing is OK   No dizziness    SUgars good   110     Doesn't do much physical activity     Current Meds  Medication Sig   acetaminophen (TYLENOL) 500 MG tablet Take 250 mg by mouth daily.   ALPRAZolam (XANAX) 0.5 MG tablet Take 1 tablet (0.5 mg total) by mouth in the morning, at noon, and at bedtime.   aspirin EC 81 MG tablet Take 81 mg by mouth daily.   atorvastatin (LIPITOR) 80 MG tablet Take 1 tablet (80 mg total) by mouth daily.   clopidogrel (PLAVIX) 75 MG tablet Take 1 tablet (75 mg total) by mouth daily.   escitalopram (LEXAPRO) 10 MG tablet Take 1 tablet (10 mg total) by mouth daily in the afternoon.   glipiZIDE-metformin (METAGLIP) 2.5-500 MG tablet TAKE 2 TABLETS BY MOUTH IN THE MORNING AND 1 TABLET AT NIGHT   HYDROcodone-acetaminophen (NORCO) 10-325 MG tablet Take 1 tablet by mouth every 6 (six) hours as needed.   hydrocortisone (ANUSOL-HC) 2.5 % rectal cream Place 1 application rectally 4 (four) times daily as needed (hemorrhoids/discomfort).    LINZESS 145 MCG CAPS capsule Take 145 mcg by mouth daily as needed (constipation.).    losartan (COZAAR) 25 MG tablet Take 1 tablet (25 mg total) by mouth daily.   metoprolol tartrate (LOPRESSOR) 25 MG tablet Take 0.5 tablets (12.5 mg total) by mouth 2 (two) times daily.   Misc Natural Products (NEURIVA PO) Take 1 tablet by mouth daily in the afternoon.   nitroGLYCERIN  (NITROSTAT) 0.4 MG SL tablet Place 1 tablet (0.4 mg total) under the tongue every 5 (five) minutes x 3 doses as needed for chest pain.   terazosin (HYTRIN) 5 MG capsule Take 1 capsule (5 mg total) by mouth daily in the afternoon.   traZODone (DESYREL) 50 MG tablet Take 1 tablet (50 mg total) by mouth at bedtime.     Allergies:   Patient has no known allergies.   Past Medical History:  Diagnosis Date   Anxiety    Chronic lower back pain    Coronary artery disease    DES to RCA 2005, DES to RCA 2017, DES x2 to CTO LAD 2017   Depression    Hyperlipidemia    Hypertension    Myocardial infarct ([place]) 01/27/2004   Type 2 diabetes mellitus ([place])    Type II diabetes mellitus ([place])     Past Surgical History:  Procedure Laterality Date   BACK SURGERY     CARDIAC CATHETERIZATION N/A 03/30/2015   Procedure: Left Heart Cath and Coronary Angiography;  Surgeon: Jettie Booze, MD;  Location: [facility];  Service: Cardiovascular;  Laterality: N/A;  CARDIAC CATHETERIZATION  03/30/2015   Procedure: Coronary Stent Intervention;  Surgeon: Jettie Booze, MD;  Location: [facility];  Service: Cardiovascular;;   CARDIAC CATHETERIZATION N/A 04/14/2015   Procedure: Coronary/Bypass Graft CTO Intervention;  Surgeon: Jettie Booze, MD;  Location: [facility];  Service: Cardiovascular;  Laterality: N/A;   CARDIAC CATHETERIZATION  04/14/2015   Procedure: Left Heart Cath and Coronary Angiography;  Surgeon: Jettie Booze, MD;  Location: [facility];  Service: Cardiovascular;;   CATARACT EXTRACTION W/PHACO Right 12/03/2012   Procedure: CATARACT EXTRACTION RIGHT EYE (WITH PHACO) AND INTRAOCULAR LENS PLACEMENT  CDE=2.41;  Surgeon: Elta Guadeloupe [name], MD;  Location: [facility];  Service: Ophthalmology;  Laterality: Right;   CATARACT EXTRACTION W/PHACO Left 12/17/2012   Procedure: CATARACT EXTRACTION PHACO AND INTRAOCULAR LENS PLACEMENT (IOC);  Surgeon: Elta Guadeloupe [name], MD;   Location: [facility];  Service: Ophthalmology;  Laterality: Left;  CDE:7.82   COLON SURGERY N/A    Phreesia 01/11/2020   COLONOSCOPY  2011   Dr. Gala Romney: diverticulosis, tubular adenomas   COLONOSCOPY WITH PROPOFOL N/A 07/24/2016   [place]k: 3 tubular adenomas removed.  Diverticulosis.  3-year surveillance colonoscopy recommended.   COLONOSCOPY WITH PROPOFOL N/A 12/15/2019   Procedure: COLONOSCOPY WITH PROPOFOL;  Surgeon: Daneil Dolin, MD;  [facility];  Service: Endoscopy;  Laterality: N/A;  8:30am   CORONARY ANGIOPLASTY WITH STENT PLACEMENT  2005   CORONARY STENT INTERVENTION N/A 04/12/2020   Procedure: CORONARY STENT INTERVENTION;  Surgeon: Sherren Mocha, MD;  Location: [facility];  Service: Cardiovascular;  Laterality: N/A;   CORONARY STENT PLACEMENT  03/30/2015   RCA    DES   EYE SURGERY N/A    Phreesia 01/11/2020   HERNIA REPAIR     INTRAVASCULAR IMAGING/OCT N/A 04/12/2020   Procedure: INTRAVASCULAR IMAGING/OCT;  Surgeon: Sherren Mocha, MD;  Location: [facility];  Service: Cardiovascular;  Laterality: N/A;   KNEE ARTHROSCOPY Left 2012   LEFT HEART CATH AND CORONARY ANGIOGRAPHY N/A 04/12/2020   Procedure: LEFT HEART CATH AND CORONARY ANGIOGRAPHY;  Surgeon: Sherren Mocha, MD;  Location: [facility];  Service: Cardiovascular;  Laterality: N/A;   [place]   "herniated disc"   POLYPECTOMY  07/24/2016   Procedure: POLYPECTOMY;  Surgeon: Daneil Dolin, MD;  [facility];  Service: Endoscopy;;  ascending colon polyp, splenic flexure polyps times 2   POLYPECTOMY  12/15/2019   Procedure: POLYPECTOMY;  Surgeon: Daneil Dolin, MD;  [facility];  Service: Endoscopy;;   UMBILICAL HERNIA REPAIR  [id number]   YAG LASER APPLICATION Right [id number]   Procedure: YAG LASER APPLICATION;  Surgeon: Elta Guadeloupe [name], MD;  Location: [facility];  Service: Ophthalmology;  Laterality: Right;     Social History:  The patient  reports that he quit  smoking about 16 years ago. His smoking use included cigarettes. He has a 1.00 pack-year smoking history. He has never used smokeless tobacco. He reports that he does not drink alcohol and does not use drugs.     Family History:  The patient's family history includes Colon cancer in his mother.    ROS:  Please see the history of present illness. All other systems are reviewed and  Negative to the above problem except as noted.    PHYSICAL EXAM: VS:  BP (!) 144/70   Pulse 73   Ht 5' 8.5" (1.74 m)   Wt (!) 305 lb 9.6  oz (138.6 kg)   SpO2 92%   BMI 45.79 kg/m   GEN:  Mobridly obese 75 yo in no acute distress  HEENT: normal  Neck: no JVD, carotid bruits Cardiac: RRR; no murmurs  No LE  edema   R wrist with minimal tenderness   No swelling  Respiratory:  clear to auscultation bilaterally, normal work of breathing GI: Obese  nontender   MS: no deformity Moving all extremities   Skin: warm and dry, no rash Neuro:  Strength and sensation are intact Psych: euthymic mood, full affect   EKG:  EKG is not ordered today.  I  CATH   March 2022   CARDIAC CATH: 04/12/2020 1. Patent stents in the LAD and RCA with mild diffuse in-stent restneosis 2. Severe eccentric stenosis of the proximal LAD, treated with PCI using a 4.0x22 mm Resolute Onyx DES under OCT guidance.  3. Moderately elevated LVEDP   Recommend: ASA and clopidogrel at least 12 months without interruption, aggressive medical therapy. Favor long-term DAPT if tolerated. OK for discharge tomorrow if no complicating features.  Left Anterior Descending  The vessel exhibits minimal luminal irregularities.  Collaterals  Dist LAD filled by collaterals from 1st Diag.     Ost LAD to Prox LAD lesion is 90% stenosed. The lesion is eccentric.  Mid LAD to Dist LAD lesion is 25% stenosed. The lesion was previously treated using a drug eluting stent over 2 years ago. Mild diffuse ISR without any flow-limiting lesions identified.  Previously  placed Dist LAD drug eluting stent is widely patent.  Left Circumflex  The vessel exhibits minimal luminal irregularities.  Right Coronary Artery  The vessel exhibits minimal luminal irregularities.  Dist RCA-1 lesion is 25% stenosed. The lesion was previously treatedover 2 years ago.  Non-stenotic Dist RCA-2 lesion was previously treated. The lesion is type C and located at the major branch.  Intervention     Lipid Panel    Component Value Date/Time   CHOL 139 07/19/2020 1440   TRIG 132 07/19/2020 1440   HDL 39 (L) 07/19/2020 1440   CHOLHDL 3.7 04/12/2020 1744   VLDL 23 04/12/2020 1744   LDLCALC 77 07/19/2020 1440      Wt Readings from Last 3 Encounters:  09/16/20 (!) 305 lb 9.6 oz (138.6 kg)  07/22/20 (!) 301 lb 1.9 oz (136.6 kg)  06/01/20 297 lb (134.7 kg)      ASSESSMENT AND PLAN:  1  CAD Pt doing well   No symtpoms of angina   Follow     2  HL  LIpids in June LDL 77  HDL 39   WIll add Zetia to regimen   F/U lipids in 8 wks    3  HTN  Keep on current meds     4  DM  A1C 6.2 in June 2022   5  Morbid obesity  Discussed diet    Recomm he look into [employer] for water activiites     Plan for f/u later this year      Current medicines are reviewed at length with the patient today.  The patient does not have concerns regarding medicines.  Signed, [name], MD  09/16/2020 1:20 PM    [facility] [address], [place], [place]  [address] Phone: [phone]; Fax: [phone]

## 2020-09-16 ENCOUNTER — Other Ambulatory Visit: Payer: Self-pay

## 2020-09-16 ENCOUNTER — Ambulatory Visit: Payer: Medicare Other | Admitting: Internal Medicine

## 2020-09-16 ENCOUNTER — Encounter: Payer: Self-pay | Admitting: Internal Medicine

## 2020-09-16 VITALS — BP 144/70 | HR 73 | Ht 68.5 in | Wt 305.6 lb

## 2020-09-16 DIAGNOSIS — I10 Essential (primary) hypertension: Secondary | ICD-10-CM | POA: Diagnosis not present

## 2020-09-16 DIAGNOSIS — E785 Hyperlipidemia, unspecified: Secondary | ICD-10-CM

## 2020-09-16 MED ORDER — EZETIMIBE 10 MG PO TABS: 10.0000 mg | ORAL_TABLET | Freq: Every day | ORAL | 3 refills | Status: DC

## 2020-09-16 NOTE — Patient Instructions (Signed)
Medication Instructions:   Start Zetia 10 mg Daily   *If you need a refill on your cardiac medications before your next appointment, please call your pharmacy*   Lab Work: Your physician recommends that you return for lab work in: October   If you have labs (blood work) drawn today and your tests are completely normal, you will receive your results only by: [employer] (if you have [place]) OR A paper copy in the mail If you have any lab test that is abnormal or we need to change your treatment, we will call you to review the results.   Testing/Procedures: NONE    Follow-Up: At [facility], you and your health needs are our priority.  As part of our continuing mission to provide you with exceptional heart care, we have created designated Provider Care Teams.  These Care Teams include your primary Cardiologist (physician) and Advanced Practice Providers (APPs -  Physician Assistants and Nurse Practitioners) who all work together to provide you with the care you need, when you need it.  We recommend signing up for the patient portal called "MyChart".  Sign up information is provided on this After Visit Summary.  MyChart is used to connect with patients for Virtual Visits (Telemedicine).  Patients are able to view lab/test results, encounter notes, upcoming appointments, etc.  Non-urgent messages can be sent to your provider as well.   To learn more about what you can do with MyChart, go to [email or web address].    Your next appointment:    6 Months   The format for your next appointment:   In Person  Provider:   [name], MD   Other Instructions Thank you for choosing [facility]!

## 2020-09-21 ENCOUNTER — Telehealth: Payer: Self-pay | Admitting: *Deleted

## 2020-09-21 NOTE — Chronic Care Management (AMB) (Signed)
  Chronic Care Management   Note  09/21/2020 Name: Steven Stevens MRN: [id number] DOB: Mar 21, 1945  Steven Stevens is a 75 y.o. year old male who is a primary care patient of [name], NP. I reached out to Molli Posey by phone today in response to a referral sent by Steven Stevens's PCP, [name], NP      Steven Stevens was given information about Chronic Care Management services today including:  CCM service includes personalized support from designated clinical staff supervised by his physician, including individualized plan of care and coordination with other care providers 24/7 contact phone numbers for assistance for urgent and routine care needs. Service will only be billed when office clinical staff spend 20 minutes or more in a month to coordinate care. Only one practitioner may furnish and bill the service in a calendar month. The patient may stop CCM services at any time (effective at the end of the month) by phone call to the office staff. The patient will be responsible for cost sharing (co-pay) of up to 20% of the service fee (after annual deductible is met).  Patient agreed to services and verbal consent obtained.   Follow up plan: Telephone appointment with care management team member scheduled for:09/22/2020  Kalynne Womac  Care Guide, Embedded Care Coordination [facility]  Care Management  Direct Dial: [phone]

## 2020-09-22 ENCOUNTER — Other Ambulatory Visit: Payer: Self-pay | Admitting: Nurse Practitioner

## 2020-09-22 ENCOUNTER — Ambulatory Visit (INDEPENDENT_AMBULATORY_CARE_PROVIDER_SITE_OTHER): Payer: Medicare Other | Admitting: Pharmacist

## 2020-09-22 DIAGNOSIS — E785 Hyperlipidemia, unspecified: Secondary | ICD-10-CM

## 2020-09-22 DIAGNOSIS — E119 Type 2 diabetes mellitus without complications: Secondary | ICD-10-CM

## 2020-09-22 DIAGNOSIS — Z9861 Coronary angioplasty status: Secondary | ICD-10-CM

## 2020-09-22 DIAGNOSIS — I2510 Atherosclerotic heart disease of native coronary artery without angina pectoris: Secondary | ICD-10-CM

## 2020-09-22 DIAGNOSIS — I10 Essential (primary) hypertension: Secondary | ICD-10-CM

## 2020-09-22 MED ORDER — SYNJARDY XR 5-1000 MG PO TB24
1.0000 | ORAL_TABLET | Freq: Two times a day (BID) | ORAL | 1 refills | Status: DC
Start: 2020-09-22 — End: 2020-11-09

## 2020-09-22 NOTE — Chronic Care Management (AMB) (Signed)
Chronic Care Management Pharmacy Note  09/22/2020 Name:  Steven Stevens MRN:  [id number] DOB:  24-Nov-1945  Recommendations made from today's visit: Concern for high risk of hypoglycemia given that the patient is taking Metaglip and not eating breakfast. Consider switching from Metaglip to Synjardy XR 5-1,000 mg by mouth twice daily with meals. Iva Boop is a tier 3 medications on patient's insurance so should be ~$47 per month which the patient states that he can afford at this time.  Subjective: Steven Stevens is an 75 y.o. year old male who is a primary patient of [name], NP.  The CCM team was consulted for assistance with disease management and care coordination needs.    Engaged with patient by telephone for initial visit in response to provider referral for pharmacy case management and/or care coordination services.   Consent to Services:  The patient was given the following information about Chronic Care Management services today, agreed to services, and gave verbal consent: 1. CCM service includes personalized support from designated clinical staff supervised by the primary care provider, including individualized plan of care and coordination with other care providers 2. 24/7 contact phone numbers for assistance for urgent and routine care needs. 3. Service will only be billed when office clinical staff spend 20 minutes or more in a month to coordinate care. 4. Only one practitioner may furnish and bill the service in a calendar month. 5.The patient may stop CCM services at any time (effective at the end of the month) by phone call to the office staff. 6. The patient will be responsible for cost sharing (co-pay) of up to 20% of the service fee (after annual deductible is met). Patient agreed to services and consent obtained.  Patient Care Team: [name], NP as PCP - General (Nurse Practitioner) Fay Records, MD as PCP - Cardiology (Cardiology) Fay Records, MD as  Attending Physician (Cardiology) Gala Romney Cristopher Estimable, MD as Consulting Physician (Gastroenterology) Beryle Lathe, [facility] (Pharmacist)  Objective:  Lab Results  Component Value Date   CREATININE 0.98 07/19/2020   CREATININE 1.19 04/13/2020   CREATININE 1.08 04/12/2020    Lab Results  Component Value Date   HGBA1C 6.2 (H) 07/19/2020   Last diabetic Eye exam:  Lab Results  Component Value Date/Time   HMDIABEYEEXA No Retinopathy 08/14/2019 12:00 AM    Last diabetic Foot exam: No results found for: HMDIABFOOTEX      Component Value Date/Time   CHOL 139 07/19/2020 1440   TRIG 132 07/19/2020 1440   HDL 39 (L) 07/19/2020 1440   CHOLHDL 3.7 04/12/2020 1744   VLDL 23 04/12/2020 1744   LDLCALC 77 07/19/2020 1440    Hepatic Function Latest Ref Rng & Units 07/19/2020 04/13/2020 02/11/2020  Total Protein 6.0 - 8.5 g/dL 6.9 5.8(L) 7.0  Albumin 3.7 - 4.7 g/dL 4.1 3.2(L) 4.2  AST 0 - 40 IU/L 15 36 20  ALT 0 - 44 IU/L _0 Alk Phosphatase 44 - 121 IU/L 40(L) 25(L) 39(L)  Total Bilirubin 0.0 - 1.2 mg/dL 0.6 0.9 0.9    Lab Results  Component Value Date/Time   TSH 1.966 04/12/2020 05:44 PM   TSH 1.560 02/11/2020 02:33 PM   FREET4 1.09 02/11/2020 02:33 PM    CBC Latest Ref Rng & Units 07/19/2020 04/13/2020 04/12/2020  WBC 3.4 - 10.8 x10E3/uL 8.5 9.1 9.0  Hemoglobin 13.0 - 17.7 g/dL 12.2(L) 11.5(L) 12.7(L)  Hematocrit 37.5 - 51.0 % 37.1(L) 34.0(L) 40.3  Platelets 150 - 450 x10E3/uL 233 210 236    No results found for: VD25OH  Clinical ASCVD: Yes  The ASCVD Risk score Mikey Bussing DC Jr., et al., 2013) failed to calculate for the following reasons:   The patient has a prior MI or stroke diagnosis    Social History   Tobacco Use  Smoking Status Former   Packs/day: 0.10   Years: 10.00   Pack years: 1.00   Types: Cigarettes   Quit date: 01/31/2004   Years since quitting: 16.6  Smokeless Tobacco Never   BP Readings from Last 3 Encounters:  09/16/20 (!) 144/70  07/22/20 133/77   06/01/20 126/84   Pulse Readings from Last 3 Encounters:  09/16/20 73  07/22/20 64  06/01/20 68   Wt Readings from Last 3 Encounters:  09/16/20 (!) 305 lb 9.6 oz (138.6 kg)  07/22/20 (!) 301 lb 1.9 oz (136.6 kg)  06/01/20 297 lb (134.7 kg)    Assessment: Review of patient past medical history, allergies, medications, health status, including review of consultants reports, laboratory and other test data, was performed as part of comprehensive evaluation and provision of chronic care management services.   SDOH:  (Social Determinants of Health) assessments and interventions performed:    CCM Care Plan  No Known Allergies  Medications Reviewed Today     Reviewed by Beryle Lathe, [facility] (Pharmacist) on 09/22/20 at [place] List Status: <None>   Medication Order Taking? Sig Documenting Provider Last Dose Status Informant  acetaminophen (TYLENOL) 500 MG tablet [id number] Yes Take 325 mg by mouth daily. [provider] Taking Active Self  ALPRAZolam Duanne Moron) 0.5 MG tablet [id number] Yes Take 1 tablet (0.5 mg total) by mouth in the morning, at noon, and at bedtime. [name], NP Taking Active   aspirin EC 81 MG tablet [id number] Yes Take 81 mg by mouth daily. [provider] Taking Active Self  atorvastatin (LIPITOR) 80 MG tablet [id number] Yes Take 1 tablet (80 mg total) by mouth daily. Barrett, Evelene Croon, PA-C Taking Active   clopidogrel (PLAVIX) 75 MG tablet [id number] Yes Take 1 tablet (75 mg total) by mouth daily. [name], NP Taking Active   escitalopram (LEXAPRO) 10 MG tablet [id number] Yes Take 1 tablet (10 mg total) by mouth daily in the afternoon. [name], NP Taking Active   ezetimibe (ZETIA) 10 MG tablet [id number] Yes Take 1 tablet (10 mg total) by mouth daily. Fay Records, MD Taking Active   glipiZIDE-metformin [facility]) 2.5-500 MG tablet [id number] Yes TAKE 2 TABLETS BY MOUTH IN THE MORNING AND 1 TABLET AT [place] G[place], NP Taking  Active Self  HYDROcodone-acetaminophen [facility]) 10-325 MG tablet [id number] Yes Take 1 tablet by mouth every 6 (six) hours as needed. [name], NP Taking Active   hydrocortisone (ANUSOL-HC) 2.5 % rectal cream [id number] Yes Place 1 application rectally 4 (four) times daily as needed (hemorrhoids/discomfort).  [provider] Taking Active Self  LINZESS 145 MCG CAPS capsule [id number] Yes Take 145 mcg by mouth daily as needed (constipation.).  [provider] Taking Active Self  losartan (COZAAR) 25 MG tablet [id number] Yes Take 1 tablet (25 mg total) by mouth daily. [name], NP Taking Active   metoprolol tartrate (LOPRESSOR) 25 MG tablet [id number] Yes Take 0.5 tablets (12.5 mg total) by mouth 2 (two) times daily. [name], NP Taking Active   Misc Natural Products (NEURIVA PO) [id number] Yes Take 1 tablet by mouth daily  in the afternoon. [provider] Taking Active Self  nitroGLYCERIN (NITROSTAT) 0.4 MG SL tablet [id number] No Place 1 tablet (0.4 mg total) under the tongue every 5 (five) minutes x 3 doses as needed for chest pain.  Patient not taking: Reported on 09/22/2020   Barrett, Evelene Croon, PA-C Not Taking Active   Study - SOS-AMI - selatogrel 16 mg/0.5 mL or placebo SQ injection (PI-Aoi Kouns) [id number] No Inject 0.5 mLs (16 mg total) into the skin once for 1 dose. Inject 0.5 mL subcutaneously in the abdomen or thigh if you experience  symptoms of a heart attack. Call 911 immediately  and seek emergency medical support.  Patient not taking: Reported on 09/22/2020   Buford Dresser, MD Not Taking Expired 04/26/20 2359 Pharmacy Records  terazosin (HYTRIN) 5 MG capsule [id number] Yes Take 1 capsule (5 mg total) by mouth daily in the afternoon. [name], NP Taking Active Self  traZODone (DESYREL) 50 MG tablet [id number] Yes Take 1 tablet (50 mg total) by mouth at bedtime. [name], NP Taking Active             Patient Active Problem List    Diagnosis Date Noted   High risk medication use 07/22/2020   Chronic back pain 04/23/2020   Non-ST elevation (NSTEMI) myocardial infarction [facility]) 04/12/2020   NSTEMI (non-ST elevated myocardial infarction) ([place]) 04/12/2020   Insomnia, unspecified 01/12/2020   Anxiety 01/12/2020   Encounter to establish care 01/12/2020   Fatigue 01/12/2020   Immunization due 01/12/2020   Bradycardia 12/17/2019   Constipation 10/01/2019   Family history of colon cancer 07/20/2016   Hx of adenomatous colonic polyps 07/20/2016   Normocytic anemia 07/20/2016   Chronic total occlusion of LAD s/p CTO/PCI wiht DES 04/14/15 04/15/2015   Type 2 diabetes mellitus treated without insulin ([place]) 03/31/2015   Essential hypertension 10/29/2013   Morbid obesity ([place]) 10/29/2013   Dyslipidemia 05/31/2012   CAD S/P RCA DES '05 and 03/11/15 05/31/2012    Immunization History  Administered Date(s) Administered   Influenza-Unspecified 11/06/2017   Moderna Sars-Covid-2 Vaccination 03/14/2019, 04/12/2019, 07/08/2020   Pneumococcal Conjugate-13 06/21/2020   Td 03/21/2011   Zoster Recombinat (Shingrix) 01/12/2020, 04/23/2020    Conditions to be addressed/monitored: CAD, HTN, HLD, and DMII  Care Plan : Medication Management  Updates made by Beryle Lathe, [place] since 09/22/2020 12:00 AM     Problem: CAD/NSTEMI, Diabetes, Hypertension, Hyperlipidemia   Priority: High  Onset Date: 09/22/2020     Goal: Disease Progression Prevention   Start Date: 09/22/2020  Expected End Date: 12/21/2020  This Visit's Progress: On track  Priority: High  Note:   Current Barriers:  Unable to achieve control of hyperlipidemia  Pharmacist Clinical Goal(s):  Over the next 90  days, patient will Achieve control of hyperlipidemia as evidenced by improved LDL through collaboration with PharmD and provider.   Interventions: 1:1 collaboration with [name], NP regarding development and update of comprehensive plan of care  as evidenced by provider attestation and co-signature Inter-disciplinary care team collaboration (see longitudinal plan of care) Comprehensive medication review performed; medication list updated in electronic medical record  Type 2 Diabetes: Current medications: metformin and glipizide (Metaglip) 5-1,000 mg by mouth every morning and 2.5 - 500 mg by mouth every evening Intolerances: none Taking medications as directed: yes Side effects thought to be attributed to current medication regimen: no Reports infrequent hypoglycemic symptoms (shaky, sweaty) Hypoglycemia prevention: not indicated at this time Current meal patterns: breakfast:  skips ;  lunch: sandwich, baked/grilled chicken, vegetables, sugar wafers, and mac&cheese ; dinner: baked/grilled chicken, potatoes, pasta, and vegetables; snacks: cookies and fruit; drinks: water, diet soda, and sweet tea Current exercise:  walks 2-3 times per week but is limited due to back pain Controlled; Most recent A1c at goal of <7% per ADA guidelines Current glucose readings: fasting blood glucose: within goal range of 80-130 mg/dL per ADA guidelines, post prandial glucose: at goal of <180 mg/dL per ADA guidelines Introduction to diabetes basic facts Medication: Identify diabetes medication and when best taken Monitoring: target blood sugar range, when to test Hypoglycemia: I have discussed with the patient how to treat hypoglycemia by the rule of 15; eat/drink 15g of sugar in the form of glucose tabs, 4 ounces of juice or soda and recheck fingerstick glucose in 15 minutes. Chocolate bars and ice cream should be avoided because fat delays carbohydrate digestion and absorption. Retreat if glucose remains low. Driving should cease until glucose is normal. Instructed to monitor blood sugars once a day at the following times: fasting (at least 8 hours since last food consumption) and whenever patient experiences symptoms of hypo/hyperglycemia Encouraged regular  aerobic exercise with a goal of 30 minutes five times per week (150 minutes per week) Patient identified as a good candidate for SGLT-2 inhibitor given reduction in cardiovascular disease, slowed chronic kidney disease progression, low risk of hypoglycemia, weight loss, and blood pressure lowering. Patient denies a history of significant genitourinary infections. No concern for hypotension/volume depletion. GFR at least >20 mL/minute/1.73 m2. Dicussed with PCP concern for high risk of hypoglycemia given that the patient is taking Metaglip and not eating breakfast. Consider switching from Metaglip to Synjardy XR 5-1,000 mg by mouth twice daily with meals. Iva Boop is a tier 3 medications on patient's insurance so should be ~$47 per month which the patient states that he can afford at this time.  Hypertension: Current medications: losartan 25 mg by mouth once daily and metoprolol tartrate 12.5 mg by mouth twice daily Intolerances: none Taking medications as directed: yes Side effects thought to be attributed to current medication regimen: no Denies dizziness, lightheadedness, blurred vision, and headache Current exercise:  walks 2-3 times per week Home blood pressure readings (Arm Cuff Adult Large): checks daily; normally 120s/70-80s Blood pressure under good control. Blood pressure is at goal of <130/80 mmHg per 2017 AHA/ACC guidelines. Continue losartan 25 mg by mouth once daily and metoprolol tartrate 12.5 mg by mouth twice daily Encourage dietary sodium restriction/DASH diet Recommend regular aerobic exercise Recommend home blood pressure monitoring, to bring results in next visit Discussed need for and importance of continued work on weight loss If additional blood pressure lowering is necessary, could consider a low does combination ARB-CCB such as olmesartan-amlodipine  CAD/NSTEMI/Hyperlipidemia: Current medications: atorvastatin 80 mg by mouth once daily and ezetimibe 10 mg by mouth once  daily. Cardiology recently recommended addition of ezetimibe. Patient has been taking for 1 week. Antiplatelet therapy: aspirin 81 mg by mouth daily and clopidogrel 75 mg by mouth daily Also on beta blocker and ARB Intolerances: none Taking medications as directed: yes Side effects thought to be attributed to current medication regimen: no LDL Uncontrolled; LDL above goal of <55 due to extreme risk given established clinical ASCVD + diabetes per 2020 AACE/ACE guidelines and TG at goal of <150 per 2020 AACE/ACE guidelines Continue atorvastatin 80 mg by mouth once daily and ezetimibe 10 mg by mouth once daily Encourage dietary reduction of high fat containing foods such as butter,  nuts, bacon, egg yolks, etc. Recommend regular aerobic exercise Discussed need for and importance of continued work on weight loss Re-check lipid panel in 4-12 weeks  Patient Goals/Self-Care Activities Over the next 90  days, patient will:  Take medications as prescribed Check blood sugar once a day at the following times: fasting (at least 8 hours since last food consumption) and whenever patient experiences symptoms of hypo/hyperglycemia, document, and provide at future appointments Check blood pressure at least once daily, document, and provide at future appointments Engage in dietary modifications by fewer sweetened foods & beverages and better food choices  Follow Up Plan: Telephone follow up appointment with care management team member scheduled for: 2 weeks Next PCP appointment scheduled for: 4 weeks        Medication Assistance: None required.  Patient affirms current coverage meets needs.  Patient's preferred pharmacy is:  [place], [place] [address] [place] [address] Phone: [phone] Fax: [phone]  Uses pill box? Yes  Follow Up:  Patient agrees to Care Plan and Follow-up.  Kennon Holter, PharmD Clinical Pharmacist [facility] [phone]

## 2020-09-22 NOTE — Patient Instructions (Addendum)
Steven Stevens,  It was great to talk to you today!  Please call me with any questions or concerns.   Visit Information   PATIENT GOALS:   Goals Addressed             This Visit's Progress    Medication Management       Patient Goals/Self-Care Activities Over the next 90  days, patient will:  Take medications as prescribed Check blood sugar once a day at the following times: fasting (at least 8 hours since last food consumption) and whenever patient experiences symptoms of hypo/hyperglycemia, document, and provide at future appointments Check blood pressure at least once daily, document, and provide at future appointments Engage in dietary modifications by fewer sweetened foods & beverages and better food choices        Consent to CCM Services: Steven Stevens was given information about Chronic Care Management services including:  CCM service includes personalized support from designated clinical staff supervised by his physician, including individualized plan of care and coordination with other care providers 24/7 contact phone numbers for assistance for urgent and routine care needs. Service will only be billed when office clinical staff spend 20 minutes or more in a month to coordinate care. Only one practitioner may furnish and bill the service in a calendar month. The patient may stop CCM services at any time (effective at the end of the month) by phone call to the office staff. The patient will be responsible for cost sharing (co-pay) of up to 20% of the service fee (after annual deductible is met).  Patient agreed to services and verbal consent obtained.   Patient verbalizes understanding of instructions provided today and agrees to view in [place].   Telephone follow up appointment with care management team member scheduled for: 10/06/20  Kennon Holter, PharmD Clinical Pharmacist [facility] [phone]  CLINICAL CARE PLAN: Patient Care Plan:  Medication Management     Problem Identified: CAD/NSTEMI, Diabetes, Hypertension, Hyperlipidemia   Priority: High  Onset Date: 09/22/2020     Goal: Disease Progression Prevention   Start Date: 09/22/2020  Expected End Date: 12/21/2020  This Visit's Progress: On track  Priority: High  Note:   Current Barriers:  Unable to achieve control of hyperlipidemia  Pharmacist Clinical Goal(s):  Over the next 90  days, patient will Achieve control of hyperlipidemia as evidenced by improved LDL through collaboration with PharmD and provider.   Interventions: 1:1 collaboration with [name], NP regarding development and update of comprehensive plan of care as evidenced by provider attestation and co-signature Inter-disciplinary care team collaboration (see longitudinal plan of care) Comprehensive medication review performed; medication list updated in electronic medical record  Type 2 Diabetes: Current medications: metformin and glipizide (Metaglip) 5-1,000 mg by mouth every morning and 2.5 - 500 mg by mouth every evening Intolerances: none Taking medications as directed: yes Side effects thought to be attributed to current medication regimen: no Reports infrequent hypoglycemic symptoms (shaky, sweaty) Hypoglycemia prevention: not indicated at this time Current meal patterns: breakfast:  skips ; lunch: sandwich, baked/grilled chicken, vegetables, sugar wafers, and mac&cheese ; dinner: baked/grilled chicken, potatoes, pasta, and vegetables; snacks: cookies and fruit; drinks: water, diet soda, and sweet tea Current exercise:  walks 2-3 times per week but is limited due to back pain Controlled; Most recent A1c at goal of <7% per ADA guidelines Current glucose readings: fasting blood glucose: within goal range of 80-130 mg/dL per ADA guidelines, post prandial glucose: at goal of <180  mg/dL per ADA guidelines Introduction to diabetes basic facts Medication: Identify diabetes medication and when  best taken Monitoring: target blood sugar range, when to test Hypoglycemia: I have discussed with the patient how to treat hypoglycemia by the rule of 15; eat/drink 15g of sugar in the form of glucose tabs, 4 ounces of juice or soda and recheck fingerstick glucose in 15 minutes. Chocolate bars and ice cream should be avoided because fat delays carbohydrate digestion and absorption. Retreat if glucose remains low. Driving should cease until glucose is normal. Instructed to monitor blood sugars once a day at the following times: fasting (at least 8 hours since last food consumption) and whenever patient experiences symptoms of hypo/hyperglycemia Encouraged regular aerobic exercise with a goal of 30 minutes five times per week (150 minutes per week) Patient identified as a good candidate for SGLT-2 inhibitor given reduction in cardiovascular disease, slowed chronic kidney disease progression, low risk of hypoglycemia, weight loss, and blood pressure lowering. Patient denies a history of significant genitourinary infections. No concern for hypotension/volume depletion. GFR at least >20 mL/minute/1.73 m2. Dicussed with PCP concern for high risk of hypoglycemia given that the patient is taking Metaglip and not eating breakfast. Consider switching from Metaglip to Synjardy XR 5-1,000 mg by mouth twice daily with meals. Steven Stevens is a tier 3 medications on patient's insurance so should be ~$47 per month which the patient states that he can afford at this time.  Hypertension: Current medications: losartan 25 mg by mouth once daily and metoprolol tartrate 12.5 mg by mouth twice daily Intolerances: none Taking medications as directed: yes Side effects thought to be attributed to current medication regimen: no Denies dizziness, lightheadedness, blurred vision, and headache Current exercise:  walks 2-3 times per week Home blood pressure readings (Arm Cuff Adult Large): checks daily; normally 120s/70-80s Blood  pressure under good control. Blood pressure is at goal of <130/80 mmHg per 2017 AHA/ACC guidelines. Continue losartan 25 mg by mouth once daily and metoprolol tartrate 12.5 mg by mouth twice daily Encourage dietary sodium restriction/DASH diet Recommend regular aerobic exercise Recommend home blood pressure monitoring, to bring results in next visit Discussed need for and importance of continued work on weight loss If additional blood pressure lowering is necessary, could consider a low does combination ARB-CCB such as olmesartan-amlodipine  CAD/NSTEMI/Hyperlipidemia: Current medications: atorvastatin 80 mg by mouth once daily and ezetimibe 10 mg by mouth once daily. Cardiology recently recommended addition of ezetimibe. Patient has been taking for 1 week. Antiplatelet therapy: aspirin 81 mg by mouth daily and clopidogrel 75 mg by mouth daily Also on beta blocker and ARB Intolerances: none Taking medications as directed: yes Side effects thought to be attributed to current medication regimen: no LDL Uncontrolled; LDL above goal of <55 due to extreme risk given established clinical ASCVD + diabetes per 2020 AACE/ACE guidelines and TG at goal of <150 per 2020 AACE/ACE guidelines Continue atorvastatin 80 mg by mouth once daily and ezetimibe 10 mg by mouth once daily Encourage dietary reduction of high fat containing foods such as butter, nuts, bacon, egg yolks, etc. Recommend regular aerobic exercise Discussed need for and importance of continued work on weight loss Re-check lipid panel in 4-12 weeks  Patient Goals/Self-Care Activities Over the next 90  days, patient will:  Take medications as prescribed Check blood sugar once a day at the following times: fasting (at least 8 hours since last food consumption) and whenever patient experiences symptoms of hypo/hyperglycemia, document, and provide at future appointments Check  blood pressure at least once daily, document, and provide at future  appointments Engage in dietary modifications by fewer sweetened foods & beverages and better food choices  Follow Up Plan: Telephone follow up appointment with care management team member scheduled for: 2 weeks Next PCP appointment scheduled for: 4 weeks

## 2020-10-06 ENCOUNTER — Ambulatory Visit: Payer: Medicare Other | Admitting: Pharmacist

## 2020-10-06 DIAGNOSIS — E785 Hyperlipidemia, unspecified: Secondary | ICD-10-CM

## 2020-10-06 DIAGNOSIS — Z9861 Coronary angioplasty status: Secondary | ICD-10-CM | POA: Diagnosis not present

## 2020-10-06 DIAGNOSIS — E119 Type 2 diabetes mellitus without complications: Secondary | ICD-10-CM | POA: Diagnosis not present

## 2020-10-06 DIAGNOSIS — I2510 Atherosclerotic heart disease of native coronary artery without angina pectoris: Secondary | ICD-10-CM | POA: Diagnosis not present

## 2020-10-06 DIAGNOSIS — I10 Essential (primary) hypertension: Secondary | ICD-10-CM | POA: Diagnosis not present

## 2020-10-06 NOTE — Patient Instructions (Signed)
Steven Stevens,  It was great to talk to you today!  Please call me with any questions or concerns.   Visit Information  PATIENT GOALS:  Goals Addressed             This Visit's Progress    Medication Management       Patient Goals/Self-Care Activities Over the next 90  days, patient will:  Take medications as prescribed Check blood sugar once a day at the following times: fasting (at least 8 hours since last food consumption) and whenever patient experiences symptoms of hypo/hyperglycemia, document, and provide at future appointments Check blood pressure at least once daily, document, and provide at future appointments Engage in dietary modifications by fewer sweetened foods & beverages and better food choices         Patient verbalizes understanding of instructions provided today and agrees to view in [place].   Telephone follow up appointment with care management team member scheduled for: 11/17/20 Next PCP appointment scheduled for: 10/21/20  Kennon Holter, PharmD Clinical Pharmacist [facility] [phone]

## 2020-10-06 NOTE — Chronic Care Management (AMB) (Signed)
Chronic Care Management Pharmacy Note  10/06/2020 Name:  Steven Stevens MRN:  [id number] DOB:  12/03/1945  Summary: patient reports he has stopped taking Metaglip and has started taking Synjardy XR twice daily and is tolerating it well. He reports fasting blood glucose ranging 101-130 which is within goal. Patient will see PCP in 2 weeks and has appropriate lab work ordered. No changes recommended at this time.   Upcoming labs to watch: CMP due to initiation of SGLT-2 inhibitor and lipid panel due to recent addition of ezetimibe by cardiology (LDL goal <55 per AACE lipid guidelines)  Subjective: Steven Stevens is an 75 y.o. year old male who is a primary patient of [name], NP.  The CCM team was consulted for assistance with disease management and care coordination needs.    Engaged with patient by telephone for follow up visit in response to provider referral for pharmacy case management and/or care coordination services.   Consent to Services:  The patient was given information about Chronic Care Management services, agreed to services, and gave verbal consent prior to initiation of services.  Please see initial visit note for detailed documentation.   Patient Care Team: [name], NP as PCP - General (Nurse Practitioner) Fay Records, MD as PCP - Cardiology (Cardiology) Fay Records, MD as Attending Physician (Cardiology) Gala Romney Cristopher Estimable, MD as Consulting Physician (Gastroenterology) Beryle Lathe, [facility] (Pharmacist)  Objective:  Lab Results  Component Value Date   CREATININE 0.98 07/19/2020   CREATININE 1.19 04/13/2020   CREATININE 1.08 04/12/2020    Lab Results  Component Value Date   HGBA1C 6.2 (H) 07/19/2020   Last diabetic Eye exam:  Lab Results  Component Value Date/Time   HMDIABEYEEXA No Retinopathy 08/14/2019 12:00 AM    Last diabetic Foot exam: No results found for: HMDIABFOOTEX      Component Value Date/Time   CHOL 139 07/19/2020  1440   TRIG 132 07/19/2020 1440   HDL 39 (L) 07/19/2020 1440   CHOLHDL 3.7 04/12/2020 1744   VLDL 23 04/12/2020 1744   LDLCALC 77 07/19/2020 1440    Hepatic Function Latest Ref Rng & Units 07/19/2020 04/13/2020 02/11/2020  Total Protein 6.0 - 8.5 g/dL 6.9 5.8(L) 7.0  Albumin 3.7 - 4.7 g/dL 4.1 3.2(L) 4.2  AST 0 - 40 IU/L 15 36 20  ALT 0 - 44 IU/L _0 Alk Phosphatase 44 - 121 IU/L 40(L) 25(L) 39(L)  Total Bilirubin 0.0 - 1.2 mg/dL 0.6 0.9 0.9    Lab Results  Component Value Date/Time   TSH 1.966 04/12/2020 05:44 PM   TSH 1.560 02/11/2020 02:33 PM   FREET4 1.09 02/11/2020 02:33 PM    CBC Latest Ref Rng & Units 07/19/2020 04/13/2020 04/12/2020  WBC 3.4 - 10.8 x10E3/uL 8.5 9.1 9.0  Hemoglobin 13.0 - 17.7 g/dL 12.2(L) 11.5(L) 12.7(L)  Hematocrit 37.5 - 51.0 % 37.1(L) 34.0(L) 40.3  Platelets 150 - 450 x10E3/uL 233 210 236    No results found for: VD25OH  Clinical ASCVD: Yes  The ASCVD Risk score Mikey Bussing DC Jr., et al., 2013) failed to calculate for the following reasons:   The patient has a prior MI or stroke diagnosis    Social History   Tobacco Use  Smoking Status Former   Packs/day: 0.10   Years: 10.00   Pack years: 1.00   Types: Cigarettes   Quit date: 01/31/2004   Years since quitting: 16.6  Smokeless Tobacco Never  BP Readings from Last 3 Encounters:  09/16/20 (!) 144/70  07/22/20 133/77  06/01/20 126/84   Pulse Readings from Last 3 Encounters:  09/16/20 73  07/22/20 64  06/01/20 68   Wt Readings from Last 3 Encounters:  09/16/20 (!) 305 lb 9.6 oz (138.6 kg)  07/22/20 (!) 301 lb 1.9 oz (136.6 kg)  06/01/20 297 lb (134.7 kg)    Assessment: Review of patient past medical history, allergies, medications, health status, including review of consultants reports, laboratory and other test data, was performed as part of comprehensive evaluation and provision of chronic care management services.   SDOH:  (Social Determinants of Health) assessments and  interventions performed:    CCM Care Plan  No Known Allergies  Medications Reviewed Today     Reviewed by Beryle Lathe, [facility] (Pharmacist) on 10/06/20 at 75  Med List Status: <None>   Medication Order Taking? Sig Documenting Provider Last Dose Status Informant  acetaminophen (TYLENOL) 500 MG tablet [id number] Yes Take 325 mg by mouth daily. [provider] Taking Active Self  ALPRAZolam Duanne Moron) 0.5 MG tablet [id number] Yes Take 1 tablet (0.5 mg total) by mouth in the morning, at noon, and at bedtime. [name], NP Taking Active   aspirin EC 81 MG tablet [id number] Yes Take 81 mg by mouth daily. [provider] Taking Active Self  atorvastatin (LIPITOR) 80 MG tablet [id number] Yes Take 1 tablet (80 mg total) by mouth daily. Barrett, Evelene Croon, PA-C Taking Active   clopidogrel (PLAVIX) 75 MG tablet [id number] Yes Take 1 tablet (75 mg total) by mouth daily. [name], NP Taking Active   Empagliflozin-metFORMIN HCl ER (SYNJARDY XR) 06-998 MG TB24 [id number] Yes Take 1 tablet by mouth 2 (two) times daily with a meal. [name], NP Taking Active   escitalopram (LEXAPRO) 10 MG tablet [id number] Yes Take 1 tablet (10 mg total) by mouth daily in the afternoon. [name], NP Taking Active   ezetimibe (ZETIA) 10 MG tablet [id number] Yes Take 1 tablet (10 mg total) by mouth daily. Fay Records, MD Taking Active   HYDROcodone-acetaminophen [facility]) 10-325 MG tablet [id number] Yes Take 1 tablet by mouth every 6 (six) hours as needed. [name], NP Taking Active   hydrocortisone (ANUSOL-HC) 2.5 % rectal cream [id number] Yes Place 1 application rectally 4 (four) times daily as needed (hemorrhoids/discomfort).  [provider] Taking Active Self  LINZESS 145 MCG CAPS capsule [id number] Yes Take 145 mcg by mouth daily as needed (constipation.).  [provider] Taking Active Self  losartan (COZAAR) 25 MG tablet [id number] Yes Take 1 tablet (25 mg total)  by mouth daily. [name], NP Taking Active   metoprolol tartrate (LOPRESSOR) 25 MG tablet [id number] Yes Take 0.5 tablets (12.5 mg total) by mouth 2 (two) times daily. [name], NP Taking Active   Misc Natural Products (NEURIVA PO) [id number] Yes Take 1 tablet by mouth daily in the afternoon. [provider] Taking Active Self  nitroGLYCERIN (NITROSTAT) 0.4 MG SL tablet [id number] No Place 1 tablet (0.4 mg total) under the tongue every 5 (five) minutes x 3 doses as needed for chest pain.  Patient not taking: No sig reported   Barrett, Evelene Croon, PA-C Not Taking Active   Study - SOS-AMI - selatogrel 16 mg/0.5 mL or placebo SQ injection (PI-Shailene Demonbreun) [id number]  Inject 0.5 mLs (16 mg total) into the skin once for 1 dose. Inject 0.5 mL subcutaneously in the abdomen or thigh  if you experience  symptoms of a heart attack. Call 911 immediately  and seek emergency medical support.  Patient not taking: Reported on 09/22/2020   Buford Dresser, MD  Expired 04/26/20 2359 Pharmacy Records  terazosin (HYTRIN) 5 MG capsule [id number] Yes Take 1 capsule (5 mg total) by mouth daily in the afternoon. [name], NP Taking Active Self  traZODone (DESYREL) 50 MG tablet [id number] Yes Take 1 tablet (50 mg total) by mouth at bedtime. [name], NP Taking Active             Patient Active Problem List   Diagnosis Date Noted   High risk medication use 07/22/2020   Chronic back pain 04/23/2020   Non-ST elevation (NSTEMI) myocardial infarction [facility]) 04/12/2020   NSTEMI (non-ST elevated myocardial infarction) ([place]) 04/12/2020   Insomnia, unspecified 01/12/2020   Anxiety 01/12/2020   Encounter to establish care 01/12/2020   Fatigue 01/12/2020   Immunization due 01/12/2020   Bradycardia 12/17/2019   Constipation 10/01/2019   Family history of colon cancer 07/20/2016   Hx of adenomatous colonic polyps 07/20/2016   Normocytic anemia 07/20/2016   Chronic total occlusion of LAD s/p  CTO/PCI wiht DES 04/14/15 04/15/2015   Type 2 diabetes mellitus treated without insulin ([place]) 03/31/2015   Essential hypertension 10/29/2013   Morbid obesity ([place]) 10/29/2013   Dyslipidemia 05/31/2012   CAD S/P RCA DES '05 and 03/11/15 05/31/2012    Immunization History  Administered Date(s) Administered   Influenza-Unspecified 11/06/2017   Moderna Sars-Covid-2 Vaccination 03/14/2019, 04/12/2019, 07/08/2020   Pneumococcal Conjugate-13 06/21/2020   Td 03/21/2011   Zoster Recombinat (Shingrix) 01/12/2020, 04/23/2020    Conditions to be addressed/monitored: CAD, HTN, HLD, and DMII  Care Plan : Medication Management  Updates made by Beryle Lathe, [place] since 10/06/2020 12:00 AM     Problem: CAD/NSTEMI, Diabetes, Hypertension, Hyperlipidemia   Priority: High  Onset Date: 09/22/2020     Goal: Disease Progression Prevention   Start Date: 09/22/2020  Expected End Date: 12/21/2020  Recent Progress: On track  Priority: High  Note:   Current Barriers:  Unable to achieve control of hyperlipidemia  Pharmacist Clinical Goal(s):  Over the next 90  days, patient will Achieve control of hyperlipidemia as evidenced by improved LDL through collaboration with PharmD and provider.   Interventions: 1:1 collaboration with [name], NP regarding development and update of comprehensive plan of care as evidenced by provider attestation and co-signature Inter-disciplinary care team collaboration (see longitudinal plan of care) Comprehensive medication review performed; medication list updated in electronic medical record  Type 2 Diabetes: Current medications: metformin and empagliflozin (Synjardy) 1,000-5 mg by mouth twice daily with meals Patient reports he recently discontinued Metaglip due to concern for hypoglycemia  Affordability: patient reports Iva Boop is ~$47 per month and he is able to afford that at this time Intolerances: none Taking medications as directed: yes Side  effects thought to be attributed to current medication regimen: no, although patient does report slight increase in urination but is not problematic at this time. Will continue to monitor. Denies recent hypoglycemic symptoms (shaky, sweaty) Hypoglycemia prevention: not indicated at this time Current meal patterns: breakfast:  skips ; lunch: sandwich, baked/grilled chicken, vegetables, sugar wafers, and mac&cheese ; dinner: baked/grilled chicken, potatoes, pasta, and vegetables; snacks: cookies and fruit; drinks: water, diet soda, and sweet tea Current exercise:  walks 2-3 times per week but is limited due to back pain Controlled; Most recent A1c at goal of <7% per ADA guidelines  Current glucose readings: fasting blood glucose: within goal range of 80-130 mg/dL per ADA guidelines, post prandial glucose: at goal of <180 mg/dL per ADA guidelines Introduction to diabetes basic facts Medication: Identify diabetes medication and when best taken Monitoring: target blood sugar range, when to test Hypoglycemia: I have discussed with the patient how to treat hypoglycemia by the rule of 15; eat/drink 15g of sugar in the form of glucose tabs, 4 ounces of juice or soda and recheck fingerstick glucose in 15 minutes. Chocolate bars and ice cream should be avoided because fat delays carbohydrate digestion and absorption. Retreat if glucose remains low. Driving should cease until glucose is normal. Instructed to monitor blood sugars once a day at the following times: fasting (at least 8 hours since last food consumption) and whenever patient experiences symptoms of hypo/hyperglycemia Encouraged regular aerobic exercise with a goal of 30 minutes five times per week (150 minutes per week)  Hypertension: Current medications: losartan 25 mg by mouth once daily and metoprolol tartrate 12.5 mg by mouth twice daily Intolerances: none Taking medications as directed: yes Side effects thought to be attributed to current  medication regimen: no Denies dizziness, lightheadedness, blurred vision, and headache Current exercise:  walks 2-3 times per week Home blood pressure readings (Arm Cuff Adult Large): checks daily; normally 120s/70-80s Blood pressure under good control. Blood pressure is at goal of <130/80 mmHg per 2017 AHA/ACC guidelines. Continue losartan 25 mg by mouth once daily and metoprolol tartrate 12.5 mg by mouth twice daily Encourage dietary sodium restriction/DASH diet Recommend regular aerobic exercise Recommend home blood pressure monitoring, to bring results in next visit Discussed need for and importance of continued work on weight loss If additional blood pressure lowering is necessary, could consider a low does combination ARB-CCB such as olmesartan-amlodipine  CAD/NSTEMI/Hyperlipidemia: Current medications: atorvastatin 80 mg by mouth once daily and ezetimibe 10 mg by mouth once daily.  Cardiology recently recommended addition of ezetimibe. Patient has been taking for a couple weeks Antiplatelet therapy: aspirin 81 mg by mouth daily and clopidogrel 75 mg by mouth daily Also on beta blocker and ARB Intolerances: none Taking medications as directed: yes Side effects thought to be attributed to current medication regimen: no LDL Uncontrolled; LDL above goal of <55 due to extreme risk given established clinical ASCVD + diabetes per 2020 AACE/ACE guidelines and TG at goal of <150 per 2020 AACE/ACE guidelines Continue atorvastatin 80 mg by mouth once daily and ezetimibe 10 mg by mouth once daily Encourage dietary reduction of high fat containing foods such as butter, nuts, bacon, egg yolks, etc. Recommend regular aerobic exercise Discussed need for and importance of continued work on weight loss Re-check lipid panel in 4-12 weeks  Patient Goals/Self-Care Activities Over the next 90  days, patient will:  Take medications as prescribed Check blood sugar once a day at the following times:  fasting (at least 8 hours since last food consumption) and whenever patient experiences symptoms of hypo/hyperglycemia, document, and provide at future appointments Check blood pressure at least once daily, document, and provide at future appointments Engage in dietary modifications by fewer sweetened foods & beverages and better food choices  Follow Up Plan: Telephone follow up appointment with care management team member scheduled for: 11/17/20 Next PCP appointment scheduled for: 10/21/20        Medication Assistance: None required.  Patient affirms current coverage meets needs.  Patient's preferred pharmacy is:  [place]  [place] [address] Phone: [phone] Fax: [phone]  Follow Up:  Patient agrees to Care Plan and Follow-up.  Plan: Telephone follow up appointment with care management team member scheduled for:  11/17/20 and Next PCP appointment scheduled for: 10/21/20  Kennon Holter, PharmD Clinical Pharmacist [facility] [phone]

## 2020-10-07 ENCOUNTER — Telehealth: Payer: Self-pay

## 2020-10-07 ENCOUNTER — Other Ambulatory Visit: Payer: Self-pay

## 2020-10-07 DIAGNOSIS — I10 Essential (primary) hypertension: Secondary | ICD-10-CM

## 2020-10-07 MED ORDER — TERAZOSIN HCL 5 MG PO CAPS: 5.0000 mg | ORAL_CAPSULE | Freq: Every day | ORAL | 1 refills | Status: DC

## 2020-10-07 NOTE — Telephone Encounter (Signed)
Rx sent 

## 2020-10-07 NOTE — Telephone Encounter (Signed)
Patient called need med refill  terazosin (HYTRIN) 5 MG capsule   [facility]

## 2020-10-12 ENCOUNTER — Other Ambulatory Visit: Payer: Self-pay | Admitting: Nurse Practitioner

## 2020-10-12 ENCOUNTER — Telehealth: Payer: Self-pay

## 2020-10-12 ENCOUNTER — Other Ambulatory Visit: Payer: Self-pay

## 2020-10-12 DIAGNOSIS — I10 Essential (primary) hypertension: Secondary | ICD-10-CM

## 2020-10-12 DIAGNOSIS — M545 Low back pain, unspecified: Secondary | ICD-10-CM

## 2020-10-12 MED ORDER — HYDROCODONE-ACETAMINOPHEN 10-325 MG PO TABS: 1.0000 | ORAL_TABLET | Freq: Four times a day (QID) | ORAL | 0 refills | Status: DC | PRN

## 2020-10-12 MED ORDER — TERAZOSIN HCL 5 MG PO CAPS: 5.0000 mg | ORAL_CAPSULE | Freq: Every day | ORAL | 1 refills | Status: DC

## 2020-10-12 NOTE — Telephone Encounter (Signed)
Patient need med refill, due Thursday 09.09.2022  HYDROcodone-acetaminophen (NORCO) 10-325 MG tablet   [facility]

## 2020-10-12 NOTE — Telephone Encounter (Signed)
Patient called said the pharmacy did not received this rx refill.

## 2020-10-12 NOTE — Telephone Encounter (Signed)
Rx sent again

## 2020-10-12 NOTE — Telephone Encounter (Signed)
Sent it in

## 2020-10-19 DIAGNOSIS — I10 Essential (primary) hypertension: Secondary | ICD-10-CM | POA: Diagnosis not present

## 2020-10-19 DIAGNOSIS — E785 Hyperlipidemia, unspecified: Secondary | ICD-10-CM | POA: Diagnosis not present

## 2020-10-19 DIAGNOSIS — E119 Type 2 diabetes mellitus without complications: Secondary | ICD-10-CM | POA: Diagnosis not present

## 2020-10-19 DIAGNOSIS — D649 Anemia, unspecified: Secondary | ICD-10-CM | POA: Diagnosis not present

## 2020-10-20 ENCOUNTER — Other Ambulatory Visit: Payer: Self-pay | Admitting: Nurse Practitioner

## 2020-10-20 DIAGNOSIS — D709 Neutropenia, unspecified: Secondary | ICD-10-CM

## 2020-10-20 LAB — CBC WITH DIFFERENTIAL/PLATELET
Basophils Absolute: 0 10*3/uL (ref 0.0–0.2)
Basos: 1 %
EOS (ABSOLUTE): 0.1 10*3/uL (ref 0.0–0.4)
Eos: 4 %
Hematocrit: 37.6 % (ref 37.5–51.0)
Hemoglobin: 12.5 g/dL — ABNORMAL LOW (ref 13.0–17.7)
Immature Grans (Abs): 0 10*3/uL (ref 0.0–0.1)
Immature Granulocytes: 0 %
Lymphocytes Absolute: 2.1 10*3/uL (ref 0.7–3.1)
Lymphs: 64 %
MCH: 30.4 pg (ref 26.6–33.0)
MCHC: 33.2 g/dL (ref 31.5–35.7)
MCV: 92 fL (ref 79–97)
Monocytes Absolute: 0.6 10*3/uL (ref 0.1–0.9)
Monocytes: 18 %
Neutrophils Absolute: 0.4 10*3/uL — CL (ref 1.4–7.0)
Neutrophils: 13 %
Platelets: 191 10*3/uL (ref 150–450)
RBC: 4.11 x10E6/uL — ABNORMAL LOW (ref 4.14–5.80)
RDW: 13.5 % (ref 11.6–15.4)
WBC: 3.2 10*3/uL — ABNORMAL LOW (ref 3.4–10.8)

## 2020-10-20 LAB — LIPID PANEL WITH LDL/HDL RATIO
Cholesterol, Total: 105 mg/dL (ref 100–199)
HDL: 40 mg/dL (ref >39)
LDL Chol Calc (NIH): 45 mg/dL (ref 0–99)
LDL/HDL Ratio: 1.1 ratio (ref 0.0–3.6)
Triglycerides: 107 mg/dL (ref 0–149)
VLDL Cholesterol Cal: 20 mg/dL (ref 5–40)

## 2020-10-20 LAB — CMP14+EGFR
ALT: 21 IU/L (ref 0–44)
AST: 23 IU/L (ref 0–40)
Albumin/Globulin Ratio: 1.8 (ref 1.2–2.2)
Albumin: 4.3 g/dL (ref 3.7–4.7)
Alkaline Phosphatase: 38 IU/L — ABNORMAL LOW (ref 44–121)
BUN/Creatinine Ratio: 9 — ABNORMAL LOW (ref 10–24)
BUN: 10 mg/dL (ref 8–27)
Bilirubin Total: 0.8 mg/dL (ref 0.0–1.2)
CO2: 25 mmol/L (ref 20–29)
Calcium: 8.9 mg/dL (ref 8.6–10.2)
Chloride: 103 mmol/L (ref 96–106)
Creatinine, Ser: 1.14 mg/dL (ref 0.76–1.27)
Globulin, Total: 2.4 g/dL (ref 1.5–4.5)
Glucose: 104 mg/dL — ABNORMAL HIGH (ref 65–99)
Potassium: 4.5 mmol/L (ref 3.5–5.2)
Sodium: 142 mmol/L (ref 134–144)
Total Protein: 6.7 g/dL (ref 6.0–8.5)
eGFR: 67 mL/min/{1.73_m2} (ref >59)

## 2020-10-20 LAB — HEMOGLOBIN A1C
Est. average glucose Bld gHb Est-mCnc: 154 mg/dL
Hgb A1c MFr Bld: 7 % — ABNORMAL HIGH (ref 4.8–5.6)

## 2020-10-20 NOTE — Progress Notes (Signed)
A1c is well controlled. His WBC is much lower than previous. We will recheck this at his appointment. Is he feeling sick?

## 2020-10-20 NOTE — Progress Notes (Signed)
-  new onset neutropenia -will recheck CBC and get pathology to review smear

## 2020-10-21 ENCOUNTER — Ambulatory Visit (INDEPENDENT_AMBULATORY_CARE_PROVIDER_SITE_OTHER): Payer: Medicare Other | Admitting: Nurse Practitioner

## 2020-10-21 ENCOUNTER — Encounter: Payer: Self-pay | Admitting: Nurse Practitioner

## 2020-10-21 ENCOUNTER — Other Ambulatory Visit: Payer: Self-pay

## 2020-10-21 VITALS — BP 131/66 | HR 72 | Temp 98.7°F | Ht 68.5 in | Wt 296.0 lb

## 2020-10-21 DIAGNOSIS — E119 Type 2 diabetes mellitus without complications: Secondary | ICD-10-CM

## 2020-10-21 DIAGNOSIS — I10 Essential (primary) hypertension: Secondary | ICD-10-CM

## 2020-10-21 DIAGNOSIS — D709 Neutropenia, unspecified: Secondary | ICD-10-CM | POA: Diagnosis not present

## 2020-10-21 DIAGNOSIS — Z23 Encounter for immunization: Secondary | ICD-10-CM | POA: Diagnosis not present

## 2020-10-21 NOTE — Patient Instructions (Signed)
Please have labs drawn today

## 2020-10-21 NOTE — Assessment & Plan Note (Signed)
BP Readings from Last 3 Encounters:  10/21/20 131/66  09/16/20 (!) 144/70  07/22/20 133/77   -well controlled

## 2020-10-21 NOTE — Progress Notes (Signed)
Established Patient Office Visit  Subjective:  Patient ID: Steven Stevens, male    DOB: 1945/08/21  Age: 75 y.o. MRN: [id number]  CC:  Chief Complaint  Patient presents with   Follow-up    Labs     HPI Steven Stevens presents for lab follow-up. He is in a drug trial currently, but states he hasn't had to use the experimental medication.  He has severe neutropenia on his labs. Denies infection, cough, etc.... but states he feels fatigued frequently.  Past Medical History:  Diagnosis Date   Anxiety    Chronic lower back pain    Coronary artery disease    DES to RCA 2005, DES to RCA 2017, DES x2 to CTO LAD 2017   Depression    Hyperlipidemia    Hypertension    Myocardial infarct ([place]) 01/27/2004   Type 2 diabetes mellitus ([place])    Type II diabetes mellitus ([place])     Past Surgical History:  Procedure Laterality Date   BACK SURGERY     CARDIAC CATHETERIZATION N/A 03/30/2015   Procedure: Left Heart Cath and Coronary Angiography;  Surgeon: Jettie Booze, MD;  Location: [place] [facility];  Service: Cardiovascular;  Laterality: N/A;   CARDIAC CATHETERIZATION  03/30/2015   Procedure: Coronary Stent Intervention;  Surgeon: Jettie Booze, MD;  Location: [place] [facility];  Service: Cardiovascular;;   CARDIAC CATHETERIZATION N/A 04/14/2015   Procedure: Coronary/Bypass Graft CTO Intervention;  Surgeon: Jettie Booze, MD;  Location: [facility];  Service: Cardiovascular;  Laterality: N/A;   CARDIAC CATHETERIZATION  04/14/2015   Procedure: Left Heart Cath and Coronary Angiography;  Surgeon: Jettie Booze, MD;  Location: [facility];  Service: Cardiovascular;;   CATARACT EXTRACTION W/PHACO Right 12/03/2012   Procedure: CATARACT EXTRACTION RIGHT EYE (WITH PHACO) AND INTRAOCULAR LENS PLACEMENT  CDE=2.41;  Surgeon: Elta Guadeloupe [name], MD;  Location: [facility];  Service: Ophthalmology;  Laterality: Right;   CATARACT EXTRACTION W/PHACO Left 12/17/2012    Procedure: CATARACT EXTRACTION PHACO AND INTRAOCULAR LENS PLACEMENT (IOC);  Surgeon: Elta Guadeloupe [name], MD;  Location: [facility];  Service: Ophthalmology;  Laterality: Left;  CDE:7.82   COLON SURGERY N/A    Phreesia 01/11/2020   COLONOSCOPY  2011   Dr. Gala Romney: diverticulosis, tubular adenomas   COLONOSCOPY WITH PROPOFOL N/A 07/24/2016   Rourk: 3 tubular adenomas removed.  Diverticulosis.  3-year surveillance colonoscopy recommended.   COLONOSCOPY WITH PROPOFOL N/A 12/15/2019   Procedure: COLONOSCOPY WITH PROPOFOL;  Surgeon: Daneil Dolin, MD;  [facility];  Service: Endoscopy;  Laterality: N/A;  8:30am   CORONARY ANGIOPLASTY WITH STENT PLACEMENT  2005   CORONARY STENT INTERVENTION N/A 04/12/2020   Procedure: CORONARY STENT INTERVENTION;  Surgeon: Sherren Mocha, MD;  Location: [place] [facility];  Service: Cardiovascular;  Laterality: N/A;   CORONARY STENT PLACEMENT  03/30/2015   RCA    DES   EYE SURGERY N/A    Phreesia 01/11/2020   HERNIA REPAIR     INTRAVASCULAR IMAGING/OCT N/A 04/12/2020   Procedure: INTRAVASCULAR IMAGING/OCT;  Surgeon: Sherren Mocha, MD;  Location: [facility];  Service: Cardiovascular;  Laterality: N/A;   KNEE ARTHROSCOPY Left 2012   LEFT HEART CATH AND CORONARY ANGIOGRAPHY N/A 04/12/2020   Procedure: LEFT HEART CATH AND CORONARY ANGIOGRAPHY;  Surgeon: Sherren Mocha, MD;  Location: [facility];  Service: Cardiovascular;  Laterality: N/A;   [place]   "herniated disc"  POLYPECTOMY  07/24/2016   Procedure: POLYPECTOMY;  Surgeon: Daneil Dolin, MD;  [facility];  Service: Endoscopy;;  ascending colon polyp, splenic flexure polyps times 2   POLYPECTOMY  12/15/2019   Procedure: POLYPECTOMY;  Surgeon: Daneil Dolin, MD;  [facility];  Service: Endoscopy;;   UMBILICAL HERNIA REPAIR  9935T   YAG LASER APPLICATION Right 01/77/9390   Procedure: YAG LASER APPLICATION;  Surgeon: Elta Guadeloupe [name], MD;  Location: [facility];  Service: Ophthalmology;  Laterality: Right;    Family History  Problem Relation Age of Onset   Colon cancer Mother        deceased age 71, diagnosed with CRC in 30s.    Social History   Socioeconomic History   Marital status: Widowed    Spouse name: Not on file   Number of children: Not on file   Years of education: Not on file   Highest education level: Not on file  Occupational History   Not on file  Tobacco Use   Smoking status: Former    Packs/day: 0.10    Years: 10.00    Pack years: 1.00    Types: Cigarettes    Quit date: 01/31/2004    Years since quitting: 16.7   Smokeless tobacco: Never  Vaping Use   Vaping Use: Never used  Substance and Sexual Activity   Alcohol use: No    Alcohol/week: 0.0 standard drinks   Drug use: No   Sexual activity: Never  Other Topics Concern   Not on file  Social History Narrative   Not on file   Social Determinants of Health   Financial Resource Strain: Low Risk    Difficulty of Paying Living Expenses: Not hard at all  Food Insecurity: No Food Insecurity   Worried About Charity fundraiser in the Last Year: Never true   [place] in the Last Year: Never true  Transportation Needs: No Transportation Needs   Lack of Transportation (Medical): No   Lack of Transportation (Non-Medical): No  Physical Activity: Insufficiently Active   Days of Exercise per Week: 5 days   Minutes of Exercise per Session: 20 min  Stress: Not on file  Social Connections: Moderately Isolated   Frequency of Communication with Friends and Family: More than three times a week   Frequency of Social Gatherings with Friends and Family: More than three times a week   Attends Religious Services: 1 to 4 times per year   Active Member of [employer] or Organizations: No   Attends Archivist Meetings: Never   Marital Status: Widowed  Intimate Partner Violence: Not on file    Outpatient Medications Prior to Visit  Medication Sig Dispense  Refill   acetaminophen (TYLENOL) 500 MG tablet Take 325 mg by mouth daily.     ALPRAZolam (XANAX) 0.5 MG tablet Take 1 tablet (0.5 mg total) by mouth in the morning, at noon, and at bedtime. 90 tablet 2   aspirin EC 81 MG tablet Take 81 mg by mouth daily.     atorvastatin (LIPITOR) 80 MG tablet Take 1 tablet (80 mg total) by mouth daily. 30 tablet 6   clopidogrel (PLAVIX) 75 MG tablet Take 1 tablet (75 mg total) by mouth daily. 90 tablet 3   Empagliflozin-metFORMIN HCl ER (SYNJARDY XR) 06-998 MG TB24 Take 1 tablet by mouth 2 (two) times daily with a meal. 180 tablet 1   escitalopram (LEXAPRO) 10 MG tablet Take 1 tablet (  10 mg total) by mouth daily in the afternoon. 30 tablet 3   ezetimibe (ZETIA) 10 MG tablet Take 1 tablet (10 mg total) by mouth daily. 90 tablet 3   HYDROcodone-acetaminophen (NORCO) 10-325 MG tablet Take 1 tablet by mouth every 6 (six) hours as needed. 120 tablet 0   hydrocortisone (ANUSOL-HC) 2.5 % rectal cream Place 1 application rectally 4 (four) times daily as needed (hemorrhoids/discomfort).      LINZESS 145 MCG CAPS capsule Take 145 mcg by mouth daily as needed (constipation.).      losartan (COZAAR) 25 MG tablet Take 1 tablet (25 mg total) by mouth daily. 90 tablet 0   metoprolol tartrate (LOPRESSOR) 25 MG tablet Take 0.5 tablets (12.5 mg total) by mouth 2 (two) times daily. 90 tablet 3   Misc Natural Products (NEURIVA PO) Take 1 tablet by mouth daily in the afternoon.     nitroGLYCERIN (NITROSTAT) 0.4 MG SL tablet Place 1 tablet (0.4 mg total) under the tongue every 5 (five) minutes x 3 doses as needed for chest pain. 25 tablet 3   terazosin (HYTRIN) 5 MG capsule Take 1 capsule (5 mg total) by mouth daily in the afternoon. 90 capsule 1   traZODone (DESYREL) 50 MG tablet Take 1 tablet (50 mg total) by mouth at bedtime. 90 tablet 1   Study - SOS-AMI - selatogrel 16 mg/0.5 mL or placebo SQ injection (PI-[facility]) Inject 0.5 mLs (16 mg total) into the skin once for 1 dose.  Inject 0.5 mL subcutaneously in the abdomen or thigh if you experience  symptoms of a heart attack. Call 911 immediately  and seek emergency medical support. (Patient not taking: Reported on 09/22/2020) 0.5 mL 0   No facility-administered medications prior to visit.    No Known Allergies  ROS Review of Systems  Constitutional:  Positive for fatigue. Negative for chills and fever.  Respiratory: Negative.    Cardiovascular: Negative.   Musculoskeletal: Negative.   Psychiatric/Behavioral: Negative.       Objective:    Physical Exam Constitutional:      Appearance: Normal appearance. He is obese.  Cardiovascular:     Rate and Rhythm: Normal rate and regular rhythm.     Pulses: Normal pulses.     Heart sounds: Normal heart sounds.  Pulmonary:     Effort: Pulmonary effort is normal.     Breath sounds: Normal breath sounds.  Neurological:     Mental Status: He is alert.  Psychiatric:        Mood and Affect: Mood normal.        Behavior: Behavior normal.        Thought Content: Thought content normal.        Judgment: Judgment normal.    BP 131/66 (BP Location: Left Arm, Patient Position: Sitting, Cuff Size: Large)   Pulse 72   Temp 98.7 F (37.1 C) (Oral)   Ht 5' 8.5" (1.74 m)   Wt 296 lb (134.3 kg)   SpO2 93%   BMI 44.35 kg/m  Wt Readings from Last 3 Encounters:  10/21/20 296 lb (134.3 kg)  09/16/20 (!) 305 lb 9.6 oz (138.6 kg)  07/22/20 (!) 301 lb 1.9 oz (136.6 kg)     Health Maintenance Due  Topic Date Due   FOOT EXAM  Never done   OPHTHALMOLOGY EXAM  08/13/2020   INFLUENZA VACCINE  09/06/2020    There are no preventive care reminders to display for this patient.  Lab Results  Component Value  Date   TSH 1.966 04/12/2020   Lab Results  Component Value Date   WBC 3.2 (L) 10/19/2020   HGB 12.5 (L) 10/19/2020   HCT 37.6 10/19/2020   MCV 92 10/19/2020   PLT 191 10/19/2020   Lab Results  Component Value Date   NA 142 10/19/2020   K 4.5 10/19/2020    CO2 25 10/19/2020   GLUCOSE 104 (H) 10/19/2020   BUN 10 10/19/2020   CREATININE 1.14 10/19/2020   BILITOT 0.8 10/19/2020   ALKPHOS 38 (L) 10/19/2020   AST 23 10/19/2020   ALT 21 10/19/2020   PROT 6.7 10/19/2020   ALBUMIN 4.3 10/19/2020   CALCIUM 8.9 10/19/2020   ANIONGAP 8 04/13/2020   EGFR 67 10/19/2020   Lab Results  Component Value Date   CHOL 105 10/19/2020   Lab Results  Component Value Date   HDL 40 10/19/2020   Lab Results  Component Value Date   LDLCALC 45 10/19/2020   Lab Results  Component Value Date   TRIG 107 10/19/2020   Lab Results  Component Value Date   CHOLHDL 3.7 04/12/2020   Lab Results  Component Value Date   HGBA1C 7.0 (H) 10/19/2020      Assessment & Plan:   Problem List Items Addressed This Visit       Cardiovascular and Mediastinum   Essential hypertension    BP Readings from Last 3 Encounters:  10/21/20 131/66  09/16/20 (!) 144/70  07/22/20 133/77  -well controlled        Endocrine   Type 2 diabetes mellitus treated without insulin ([place])    -well controlled with A1c 7 -on statin and ARB -continue current meds        Other   Neutropenia ([place])    -thought it may be drug-induced form experimental medication, but he states he has not used the experimental meds -pharmacists and I reviewed med list, and no obvious cause of neutropenia found -repeat CBC and will get pathologist smear -will consider hematology consult based on results       No orders of the defined types were placed in this encounter.   Follow-up: Return in about 3 months (around 01/20/2021) for Lab follow-up (same-day fasting labs).    [name], NP

## 2020-10-21 NOTE — Assessment & Plan Note (Addendum)
-  well controlled with A1c 7 -on statin and ARB -continue current meds

## 2020-10-21 NOTE — Assessment & Plan Note (Signed)
-  thought it may be drug-induced form experimental medication, but he states he has not used the experimental meds -pharmacists and I reviewed med list, and no obvious cause of neutropenia found -repeat CBC and will get pathologist smear -will consider hematology consult based on results

## 2020-11-04 ENCOUNTER — Other Ambulatory Visit: Payer: Self-pay | Admitting: Cardiovascular Disease

## 2020-11-04 NOTE — Telephone Encounter (Signed)
This is a [place] pt.  °

## 2020-11-05 ENCOUNTER — Other Ambulatory Visit: Payer: Self-pay

## 2020-11-05 ENCOUNTER — Encounter: Payer: Self-pay | Admitting: Internal Medicine

## 2020-11-05 ENCOUNTER — Ambulatory Visit (INDEPENDENT_AMBULATORY_CARE_PROVIDER_SITE_OTHER): Payer: Medicare Other | Admitting: Internal Medicine

## 2020-11-05 VITALS — BP 114/69 | HR 66 | Temp 98.1°F | Resp 18 | Ht 68.5 in | Wt 297.1 lb

## 2020-11-05 DIAGNOSIS — N3001 Acute cystitis with hematuria: Secondary | ICD-10-CM

## 2020-11-05 DIAGNOSIS — N390 Urinary tract infection, site not specified: Secondary | ICD-10-CM | POA: Diagnosis not present

## 2020-11-05 LAB — POCT URINALYSIS DIP (CLINITEK)
Bilirubin, UA: NEGATIVE
Glucose, UA: >=1000 mg/dL — AB
Ketones, POC UA: NEGATIVE mg/dL
Leukocytes, UA: NEGATIVE
Nitrite, UA: NEGATIVE
POC PROTEIN,UA: NEGATIVE
Spec Grav, UA: 1.02 (ref 1.010–1.025)
Urobilinogen, UA: 0.2 E.U./dL
pH, UA: 5 (ref 5.0–8.0)

## 2020-11-05 MED ORDER — SULFAMETHOXAZOLE-TRIMETHOPRIM 800-160 MG PO TABS: 1.0000 | ORAL_TABLET | Freq: Two times a day (BID) | ORAL | 0 refills | Status: DC

## 2020-11-05 NOTE — Patient Instructions (Signed)
Urinary Tract Infection, Adult °A urinary tract infection (UTI) is an infection of any part of the urinary tract. The urinary tract includes: °The kidneys. °The ureters. °The bladder. °The urethra. °These organs make, store, and get rid of pee (urine) in the body. °What are the causes? °This infection is caused by germs (bacteria) in your genital area. These germs grow and cause swelling (inflammation) of your urinary tract. °What increases the risk? °The following factors may make you more likely to develop this condition: °Using a small, thin tube (catheter) to drain pee. °Not being able to control when you pee or poop (incontinence). °Being male. If you are male, these things can increase the risk: °Using these methods to prevent pregnancy: °A medicine that kills sperm (spermicide). °A device that blocks sperm (diaphragm). °Having low levels of a male hormone (estrogen). °Being pregnant. °You are more likely to develop this condition if: °You have genes that add to your risk. °You are sexually active. °You take antibiotic medicines. °You have trouble peeing because of: °A prostate that is bigger than normal, if you are male. °A blockage in the part of your body that drains pee from the bladder. °A kidney stone. °A nerve condition that affects your bladder. °Not getting enough to drink. °Not peeing often enough. °You have other conditions, such as: °Diabetes. °A weak disease-fighting system (immune system). °Sickle cell disease. °Gout. °Injury of the spine. °What are the signs or symptoms? °Symptoms of this condition include: °Needing to pee right away. °Peeing small amounts often. °Pain or burning when peeing. °Blood in the pee. °Pee that smells bad or not like normal. °Trouble peeing. °Pee that is cloudy. °Fluid coming from the vagina, if you are male. °Pain in the belly or lower back. °Other symptoms include: °Vomiting. °Not feeling hungry. °Feeling mixed up (confused). This may be the first symptom in  older adults. °Being tired and grouchy (irritable). °A fever. °Watery poop (diarrhea). °How is this treated? °Taking antibiotic medicine. °Taking other medicines. °Drinking enough water. °In some cases, you may need to see a specialist. °Follow these instructions at home: °Medicines °Take over-the-counter and prescription medicines only as told by your doctor. °If you were prescribed an antibiotic medicine, take it as told by your doctor. Do not stop taking it even if you start to feel better. °General instructions °Make sure you: °Pee until your bladder is empty. °Do not hold pee for a long time. °Empty your bladder after sex. °Wipe from front to back after peeing or pooping if you are a male. Use each tissue one time when you wipe. °Drink enough fluid to keep your pee pale yellow. °Keep all follow-up visits. °Contact a doctor if: °You do not get better after 1-2 days. °Your symptoms go away and then come back. °Get help right away if: °You have very bad back pain. °You have very bad pain in your lower belly. °You have a fever. °You have chills. °You feeling like you will vomit or you vomit. °Summary °A urinary tract infection (UTI) is an infection of any part of the urinary tract. °This condition is caused by germs in your genital area. °There are many risk factors for a UTI. °Treatment includes antibiotic medicines. °Drink enough fluid to keep your pee pale yellow. °This information is not intended to replace advice given to you by your health care provider. Make sure you discuss any questions you have with your health care provider. °Document Revised: 09/05/2019 Document Reviewed: 09/05/2019 °Elsevier Patient Education © 2022   [employer] ° °

## 2020-11-05 NOTE — Progress Notes (Signed)
Acute Office Visit  Subjective:    Patient ID: Steven Stevens, male    DOB: 04/02/1945, 75 y.o.   MRN: [id number]  Chief Complaint  Patient presents with   Urinary Tract Infection    Pt feels like he has UTI symptoms started 10-22-20 He is having burning when urinating frequent urination     HPI Patient is in today for c/o dysuria and urinary frequency for last 2 weeks. He denies any hematuria. He denies any fever, chills, nausea or vomiting.  He denies any flank pain.  Denies any history of UTI in the past.  Of note, he was recently started on Synjardy in the last month.  Past Medical History:  Diagnosis Date   Anxiety    Chronic lower back pain    Coronary artery disease    DES to RCA 2005, DES to RCA 2017, DES x2 to CTO LAD 2017   Depression    Hyperlipidemia    Hypertension    Myocardial infarct ([place]) 01/27/2004   Type 2 diabetes mellitus ([place])    Type II diabetes mellitus ([place])     Past Surgical History:  Procedure Laterality Date   BACK SURGERY     CARDIAC CATHETERIZATION N/A 03/30/2015   Procedure: Left Heart Cath and Coronary Angiography;  Surgeon: Jettie Booze, MD;  Location: [place] [facility];  Service: Cardiovascular;  Laterality: N/A;   CARDIAC CATHETERIZATION  03/30/2015   Procedure: Coronary Stent Intervention;  Surgeon: Jettie Booze, MD;  Location: [place] [facility];  Service: Cardiovascular;;   CARDIAC CATHETERIZATION N/A 04/14/2015   Procedure: Coronary/Bypass Graft CTO Intervention;  Surgeon: Jettie Booze, MD;  Location: [place] [facility];  Service: Cardiovascular;  Laterality: N/A;   CARDIAC CATHETERIZATION  04/14/2015   Procedure: Left Heart Cath and Coronary Angiography;  Surgeon: Jettie Booze, MD;  Location: [place] [facility];  Service: Cardiovascular;;   CATARACT EXTRACTION W/PHACO Right 12/03/2012   Procedure: CATARACT EXTRACTION RIGHT EYE (WITH PHACO) AND INTRAOCULAR LENS PLACEMENT  CDE=2.41;  Surgeon: Elta Guadeloupe [name], MD;   Location: [facility];  Service: Ophthalmology;  Laterality: Right;   CATARACT EXTRACTION W/PHACO Left 12/17/2012   Procedure: CATARACT EXTRACTION PHACO AND INTRAOCULAR LENS PLACEMENT (IOC);  Surgeon: Elta Guadeloupe [name], MD;  Location: [facility];  Service: Ophthalmology;  Laterality: Left;  CDE:7.82   COLON SURGERY N/A    Phreesia 01/11/2020   COLONOSCOPY  2011   Dr. Gala Romney: diverticulosis, tubular adenomas   COLONOSCOPY WITH PROPOFOL N/A 07/24/2016   Rourk: 3 tubular adenomas removed.  Diverticulosis.  3-year surveillance colonoscopy recommended.   COLONOSCOPY WITH PROPOFOL N/A 12/15/2019   Procedure: COLONOSCOPY WITH PROPOFOL;  Surgeon: Daneil Dolin, MD;  [facility];  Service: Endoscopy;  Laterality: N/A;  8:30am   CORONARY ANGIOPLASTY WITH STENT PLACEMENT  2005   CORONARY STENT INTERVENTION N/A 04/12/2020   Procedure: CORONARY STENT INTERVENTION;  Surgeon: Sherren Mocha, MD;  Location: [place] [facility];  Service: Cardiovascular;  Laterality: N/A;   CORONARY STENT PLACEMENT  03/30/2015   RCA    DES   EYE SURGERY N/A    Phreesia 01/11/2020   HERNIA REPAIR     INTRAVASCULAR IMAGING/OCT N/A 04/12/2020   Procedure: INTRAVASCULAR IMAGING/OCT;  Surgeon: Sherren Mocha, MD;  Location: [facility];  Service: Cardiovascular;  Laterality: N/A;   KNEE ARTHROSCOPY Left 2012   LEFT HEART CATH AND CORONARY ANGIOGRAPHY N/A 04/12/2020   Procedure: LEFT HEART CATH AND CORONARY ANGIOGRAPHY;  Surgeon: Sherren Mocha, MD;  Location: [place] [facility];  Service: Cardiovascular;  Laterality: N/A;   [place]   "herniated disc"   POLYPECTOMY  07/24/2016   Procedure: POLYPECTOMY;  Surgeon: Daneil Dolin, MD;  [facility];  Service: Endoscopy;;  ascending colon polyp, splenic flexure polyps times 2   POLYPECTOMY  12/15/2019   Procedure: POLYPECTOMY;  Surgeon: Daneil Dolin, MD;  [facility];  Service: Endoscopy;;   UMBILICAL HERNIA REPAIR  1990s   YAG  LASER APPLICATION Right 16/08/3708   Procedure: YAG LASER APPLICATION;  Surgeon: Elta Guadeloupe [name], MD;  [facility]: [facility];  Service: Ophthalmology;  Laterality: Right;    Family History  Problem Relation Age of Onset   Colon cancer Mother        deceased age 29, diagnosed with CRC in 46s.    Social History   Socioeconomic History   Marital status: Widowed    Spouse name: Not on file   Number of children: Not on file   Years of education: Not on file   Highest education level: Not on file  Occupational History   Not on file  Tobacco Use   Smoking status: Former    Packs/day: 0.10    Years: 10.00    Pack years: 1.00    Types: Cigarettes    Quit date: 01/31/2004    Years since quitting: 16.7   Smokeless tobacco: Never  Vaping Use   Vaping Use: Never used  Substance and Sexual Activity   Alcohol use: No    Alcohol/week: 0.0 standard drinks   Drug use: No   Sexual activity: Never  Other Topics Concern   Not on file  Social History Narrative   Not on file   Social Determinants of Health   Financial Resource Strain: Low Risk    Difficulty of Paying Living Expenses: Not hard at all  Food Insecurity: No Food Insecurity   Worried About Charity fundraiser in the Last Year: Never true   [place] in the Last Year: Never true  Transportation Needs: No Transportation Needs   Lack of Transportation (Medical): No   Lack of Transportation (Non-Medical): No  Physical Activity: Insufficiently Active   Days of Exercise per Week: 5 days   Minutes of Exercise per Session: 20 min  Stress: Not on file  Social Connections: Moderately Isolated   Frequency of Communication with Friends and Family: More than three times a week   Frequency of Social Gatherings with Friends and Family: More than three times a week   Attends Religious Services: 1 to 4 times per year   Active Member of [employer] or Organizations: No   Attends Archivist Meetings: Never   Marital Status:  Widowed  Intimate Partner Violence: Not on file    Outpatient Medications Prior to Visit  Medication Sig Dispense Refill   acetaminophen (TYLENOL) 500 MG tablet Take 325 mg by mouth daily.     ALPRAZolam (XANAX) 0.5 MG tablet Take 1 tablet (0.5 mg total) by mouth in the morning, at noon, and at bedtime. 90 tablet 2   aspirin EC 81 MG tablet Take 81 mg by mouth daily.     atorvastatin (LIPITOR) 80 MG tablet TAKE 1 TABLET ONCE DAILY. 30 tablet 6   clopidogrel (PLAVIX) 75 MG tablet Take 1 tablet (75 mg total) by mouth daily. 90 tablet 3   Empagliflozin-metFORMIN HCl ER (SYNJARDY XR) 06-998 MG TB24  Take 1 tablet by mouth 2 (two) times daily with a meal. 180 tablet 1   escitalopram (LEXAPRO) 10 MG tablet Take 1 tablet (10 mg total) by mouth daily in the afternoon. 30 tablet 3   ezetimibe (ZETIA) 10 MG tablet Take 1 tablet (10 mg total) by mouth daily. 90 tablet 3   HYDROcodone-acetaminophen (NORCO) 10-325 MG tablet Take 1 tablet by mouth every 6 (six) hours as needed. 120 tablet 0   hydrocortisone (ANUSOL-HC) 2.5 % rectal cream Place 1 application rectally 4 (four) times daily as needed (hemorrhoids/discomfort).      LINZESS 145 MCG CAPS capsule Take 145 mcg by mouth daily as needed (constipation.).      losartan (COZAAR) 25 MG tablet Take 1 tablet (25 mg total) by mouth daily. 90 tablet 0   metoprolol tartrate (LOPRESSOR) 25 MG tablet Take 0.5 tablets (12.5 mg total) by mouth 2 (two) times daily. 90 tablet 3   Misc Natural Products (NEURIVA PO) Take 1 tablet by mouth daily in the afternoon.     nitroGLYCERIN (NITROSTAT) 0.4 MG SL tablet Place 1 tablet (0.4 mg total) under the tongue every 5 (five) minutes x 3 doses as needed for chest pain. 25 tablet 3   terazosin (HYTRIN) 5 MG capsule Take 1 capsule (5 mg total) by mouth daily in the afternoon. 90 capsule 1   traZODone (DESYREL) 50 MG tablet Take 1 tablet (50 mg total) by mouth at bedtime. 90 tablet 1   Study - SOS-AMI - selatogrel 16 mg/0.5 mL  or placebo SQ injection (PI-Christopher) Inject 0.5 mLs (16 mg total) into the skin once for 1 dose. Inject 0.5 mL subcutaneously in the abdomen or thigh if you experience  symptoms of a heart attack. Call 911 immediately  and seek emergency medical support. (Patient not taking: Reported on 09/22/2020) 0.5 mL 0   No facility-administered medications prior to visit.    No Known Allergies  Review of Systems  Constitutional:  Negative for chills and fever.  Respiratory:  Negative for cough and shortness of breath.   Cardiovascular:  Negative for chest pain and palpitations.  Gastrointestinal:  Negative for diarrhea, nausea and vomiting.  Genitourinary:  Positive for dysuria and frequency. Negative for flank pain and urgency.  Skin:  Negative for rash.  Neurological:  Negative for dizziness and weakness.  Psychiatric/Behavioral:  Negative for agitation and behavioral problems.       Objective:    Physical Exam Constitutional:      General: He is not in acute distress.    Appearance: He is obese. He is not diaphoretic.  HENT:     Head: Normocephalic and atraumatic.  Eyes:     General: No scleral icterus.    Extraocular Movements: Extraocular movements intact.  Cardiovascular:     Rate and Rhythm: Normal rate and regular rhythm.     Pulses: Normal pulses.     Heart sounds: Normal heart sounds. No murmur heard. Pulmonary:     Breath sounds: Normal breath sounds. No wheezing or rales.  Abdominal:     Palpations: Abdomen is soft.     Tenderness: There is abdominal tenderness (Mild, suprapubic area). There is no right CVA tenderness or left CVA tenderness.  Neurological:     General: No focal deficit present.     Mental Status: He is alert and oriented to person, place, and time.  Psychiatric:        Mood and Affect: Mood normal.  Behavior: Behavior normal.    BP 114/69 (BP Location: Left Arm, Patient Position: Sitting, Cuff Size: Normal)   Pulse 66   Temp 98.1 F (36.7 C)  (Oral)   Resp 18   Ht 5' 8.5" (1.74 m)   Wt 297 lb 1.3 oz (134.8 kg)   SpO2 94%   BMI 44.51 kg/m  Wt Readings from Last 3 Encounters:  11/05/20 297 lb 1.3 oz (134.8 kg)  10/21/20 296 lb (134.3 kg)  09/16/20 (!) 305 lb 9.6 oz (138.6 kg)    Health Maintenance Due  Topic Date Due   FOOT EXAM  Never done   OPHTHALMOLOGY EXAM  08/13/2020    There are no preventive care reminders to display for this patient.   Lab Results  Component Value Date   TSH 1.966 04/12/2020   Lab Results  Component Value Date   WBC 3.2 (L) 10/19/2020   HGB 12.5 (L) 10/19/2020   HCT 37.6 10/19/2020   MCV 92 10/19/2020   PLT 191 10/19/2020   Lab Results  Component Value Date   NA 142 10/19/2020   K 4.5 10/19/2020   CO2 25 10/19/2020   GLUCOSE 104 (H) 10/19/2020   BUN 10 10/19/2020   CREATININE 1.14 10/19/2020   BILITOT 0.8 10/19/2020   ALKPHOS 38 (L) 10/19/2020   AST 23 10/19/2020   ALT 21 10/19/2020   PROT 6.7 10/19/2020   ALBUMIN 4.3 10/19/2020   CALCIUM 8.9 10/19/2020   ANIONGAP 8 04/13/2020   EGFR 67 10/19/2020   Lab Results  Component Value Date   CHOL 105 10/19/2020   Lab Results  Component Value Date   HDL 40 10/19/2020   Lab Results  Component Value Date   LDLCALC 45 10/19/2020   Lab Results  Component Value Date   TRIG 107 10/19/2020   Lab Results  Component Value Date   CHOLHDL 3.7 04/12/2020   Lab Results  Component Value Date   HGBA1C 7.0 (H) 10/19/2020       Assessment & Plan:   Problem List Items Addressed This Visit    Visit Diagnoses     Acute cystitis with hematuria    -  Primary UA reviewed - RBC noted, no LE or nitrite Check urine culture Symptoms consistent with UTI, started Bactrim Will need repeat UA after symptoms resolve to check for hematuria If persistent hematuria, may need imaging and/or urology referral  If recurrent UTI, will need to change from Synjardy   Relevant Medications   sulfamethoxazole-trimethoprim (BACTRIM DS)  800-160 MG tablet   Other Relevant Orders   POCT URINALYSIS DIP (CLINITEK) (Completed)   Urine Culture        Meds ordered this encounter  Medications   sulfamethoxazole-trimethoprim (BACTRIM DS) 800-160 MG tablet    Sig: Take 1 tablet by mouth 2 (two) times daily.    Dispense:  10 tablet    Refill:  0     Alishba [place] Keith Rake, MD

## 2020-11-09 ENCOUNTER — Encounter: Payer: Self-pay | Admitting: Nurse Practitioner

## 2020-11-09 ENCOUNTER — Ambulatory Visit (INDEPENDENT_AMBULATORY_CARE_PROVIDER_SITE_OTHER): Payer: Medicare Other | Admitting: Nurse Practitioner

## 2020-11-09 ENCOUNTER — Other Ambulatory Visit: Payer: Self-pay

## 2020-11-09 DIAGNOSIS — N390 Urinary tract infection, site not specified: Secondary | ICD-10-CM

## 2020-11-09 DIAGNOSIS — E119 Type 2 diabetes mellitus without complications: Secondary | ICD-10-CM

## 2020-11-09 MED ORDER — PHENAZOPYRIDINE HCL 97.2 MG PO TABS: 97.0000 mg | ORAL_TABLET | Freq: Three times a day (TID) | ORAL | 0 refills | Status: DC | PRN

## 2020-11-09 MED ORDER — CIPROFLOXACIN HCL 500 MG PO TABS: 500.0000 mg | ORAL_TABLET | Freq: Two times a day (BID) | ORAL | 0 refills | Status: AC

## 2020-11-09 MED ORDER — GLIPIZIDE-METFORMIN HCL 2.5-500 MG PO TABS: ORAL_TABLET | ORAL | 1 refills | Status: DC

## 2020-11-09 NOTE — Assessment & Plan Note (Signed)
-  STOP synjardy d/t UTI -RESTARTED metaglip

## 2020-11-09 NOTE — Assessment & Plan Note (Signed)
-  he will bring in urine sample tomorrow; will get U/A and culture -Rx. cipro and Azo -he states he has penile swelling; may need antifungal if this doesn't resolve with cipro alone

## 2020-11-09 NOTE — Progress Notes (Signed)
Acute Office Visit  Subjective:    Patient ID: Steven Stevens, male    DOB: 07-Jul-1945, 75 y.o.   MRN: [id number]  Chief Complaint  Patient presents with   Urinary Tract Infection    Medication not helping     Urinary Tract Infection  Associated symptoms include frequency.  Patient is in today for UTI symptoms. He is experiencing urinary frequency and he states he has trouble retracting his foreskin. He has dysuria.  He has been taking synjardy.  Past Medical History:  Diagnosis Date   Anxiety    Chronic lower back pain    Coronary artery disease    DES to RCA 2005, DES to RCA 2017, DES x2 to CTO LAD 2017   Depression    Hyperlipidemia    Hypertension    Myocardial infarct ([place]) 01/27/2004   Type 2 diabetes mellitus ([place])    Type II diabetes mellitus ([place])     Past Surgical History:  Procedure Laterality Date   BACK SURGERY     CARDIAC CATHETERIZATION N/A 03/30/2015   Procedure: Left Heart Cath and Coronary Angiography;  Surgeon: Jettie Booze, MD;  Location: [facility];  Service: Cardiovascular;  Laterality: N/A;   CARDIAC CATHETERIZATION  03/30/2015   Procedure: Coronary Stent Intervention;  Surgeon: Jettie Booze, MD;  Location: [facility] [facility];  Service: Cardiovascular;;   CARDIAC CATHETERIZATION N/A 04/14/2015   Procedure: Coronary/Bypass Graft CTO Intervention;  Surgeon: Jettie Booze, MD;  Location: [facility];  Service: Cardiovascular;  Laterality: N/A;   CARDIAC CATHETERIZATION  04/14/2015   Procedure: Left Heart Cath and Coronary Angiography;  Surgeon: Jettie Booze, MD;  Location: [place];  Service: Cardiovascular;;   CATARACT EXTRACTION W/PHACO Right 12/03/2012   Procedure: CATARACT EXTRACTION RIGHT EYE (WITH PHACO) AND INTRAOCULAR LENS PLACEMENT  CDE=2.41;  Surgeon: Elta Guadeloupe [name], MD;  [facility];  Service: Ophthalmology;  Laterality: Right;   CATARACT EXTRACTION W/PHACO Left 12/17/2012   Procedure:  CATARACT EXTRACTION PHACO AND INTRAOCULAR LENS PLACEMENT (IOC);  Surgeon: Elta Guadeloupe [name], MD;  Location: [facility];  Service: Ophthalmology;  Laterality: Left;  CDE:7.82   COLON SURGERY N/A    Phreesia 01/11/2020   COLONOSCOPY  2011   Dr. Gala Romney: diverticulosis, tubular adenomas   COLONOSCOPY WITH PROPOFOL N/A 07/24/2016   Rourk: 3 tubular adenomas removed.  Diverticulosis.  3-year surveillance colonoscopy recommended.   COLONOSCOPY WITH PROPOFOL N/A 12/15/2019   Procedure: COLONOSCOPY WITH PROPOFOL;  Surgeon: Daneil Dolin, MD;  [facility];  Service: Endoscopy;  Laterality: N/A;  8:30am   CORONARY ANGIOPLASTY WITH STENT PLACEMENT  2005   CORONARY STENT INTERVENTION N/A 04/12/2020   Procedure: CORONARY STENT INTERVENTION;  Surgeon: Sherren Mocha, MD;  Location: [place] [facility];  Service: Cardiovascular;  Laterality: N/A;   CORONARY STENT PLACEMENT  03/30/2015   RCA    DES   EYE SURGERY N/A    Phreesia 01/11/2020   HERNIA REPAIR     INTRAVASCULAR IMAGING/OCT N/A 04/12/2020   Procedure: INTRAVASCULAR IMAGING/OCT;  Surgeon: Sherren Mocha, MD;  Location: [place] [facility];  Service: Cardiovascular;  Laterality: N/A;   KNEE ARTHROSCOPY Left 2012   LEFT HEART CATH AND CORONARY ANGIOGRAPHY N/A 04/12/2020   Procedure: LEFT HEART CATH AND CORONARY ANGIOGRAPHY;  Surgeon: Sherren Mocha, MD;  Location: [facility] [facility];  Service: Cardiovascular;  Laterality: N/A;   [place]   "herniated disc"  POLYPECTOMY  07/24/2016   Procedure: POLYPECTOMY;  Surgeon: Daneil Dolin, MD;  [facility];  Service: Endoscopy;;  ascending colon polyp, splenic flexure polyps times 2   POLYPECTOMY  12/15/2019   Procedure: POLYPECTOMY;  Surgeon: Daneil Dolin, MD;  [facility];  Service: Endoscopy;;   UMBILICAL HERNIA REPAIR  9470R   YAG LASER APPLICATION Right 61/51/8343   Procedure: YAG LASER APPLICATION;  Surgeon: Elta Guadeloupe [name], MD;  [facility];   Service: Ophthalmology;  Laterality: Right;    Family History  Problem Relation Age of Onset   Colon cancer Mother        deceased age 65, diagnosed with CRC in 70s.    Social History   Socioeconomic History   Marital status: Widowed    Spouse name: Not on file   Number of children: Not on file   Years of education: Not on file   Highest education level: Not on file  Occupational History   Not on file  Tobacco Use   Smoking status: Former    Packs/day: 0.10    Years: 10.00    Pack years: 1.00    Types: Cigarettes    Quit date: 01/31/2004    Years since quitting: 16.7   Smokeless tobacco: Never  Vaping Use   Vaping Use: Never used  Substance and Sexual Activity   Alcohol use: No    Alcohol/week: 0.0 standard drinks   Drug use: No   Sexual activity: Never  Other Topics Concern   Not on file  Social History Narrative   Not on file   Social Determinants of Health   Financial Resource Strain: Low Risk    Difficulty of Paying Living Expenses: Not hard at all  Food Insecurity: No Food Insecurity   Worried About Charity fundraiser in the Last Year: Never true   [place] in the Last Year: Never true  Transportation Needs: No Transportation Needs   Lack of Transportation (Medical): No   Lack of Transportation (Non-Medical): No  Physical Activity: Insufficiently Active   Days of Exercise per Week: 5 days   Minutes of Exercise per Session: 20 min  Stress: Not on file  Social Connections: Moderately Isolated   Frequency of Communication with Friends and Family: More than three times a week   Frequency of Social Gatherings with Friends and Family: More than three times a week   Attends Religious Services: 1 to 4 times per year   Active Member of [employer] or Organizations: No   Attends Archivist Meetings: Never   Marital Status: Widowed  Intimate Partner Violence: Not on file    Outpatient Medications Prior to Visit  Medication Sig Dispense Refill    acetaminophen (TYLENOL) 500 MG tablet Take 325 mg by mouth daily.     ALPRAZolam (XANAX) 0.5 MG tablet Take 1 tablet (0.5 mg total) by mouth in the morning, at noon, and at bedtime. 90 tablet 2   aspirin EC 81 MG tablet Take 81 mg by mouth daily.     atorvastatin (LIPITOR) 80 MG tablet TAKE 1 TABLET ONCE DAILY. 30 tablet 6   clopidogrel (PLAVIX) 75 MG tablet Take 1 tablet (75 mg total) by mouth daily. 90 tablet 3   escitalopram (LEXAPRO) 10 MG tablet Take 1 tablet (10 mg total) by mouth daily in the afternoon. 30 tablet 3   ezetimibe (ZETIA) 10 MG tablet Take 1 tablet (10 mg total) by mouth daily. [place]  tablet 3   HYDROcodone-acetaminophen (NORCO) 10-325 MG tablet Take 1 tablet by mouth every 6 (six) hours as needed. 120 tablet 0   hydrocortisone (ANUSOL-HC) 2.5 % rectal cream Place 1 application rectally 4 (four) times daily as needed (hemorrhoids/discomfort).      LINZESS 145 MCG CAPS capsule Take 145 mcg by mouth daily as needed (constipation.).      losartan (COZAAR) 25 MG tablet Take 1 tablet (25 mg total) by mouth daily. 90 tablet 0   metoprolol tartrate (LOPRESSOR) 25 MG tablet Take 0.5 tablets (12.5 mg total) by mouth 2 (two) times daily. 90 tablet 3   Misc Natural Products (NEURIVA PO) Take 1 tablet by mouth daily in the afternoon.     nitroGLYCERIN (NITROSTAT) 0.4 MG SL tablet Place 1 tablet (0.4 mg total) under the tongue every 5 (five) minutes x 3 doses as needed for chest pain. 25 tablet 3   sulfamethoxazole-trimethoprim (BACTRIM DS) 800-160 MG tablet Take 1 tablet by mouth 2 (two) times daily. 10 tablet 0   terazosin (HYTRIN) 5 MG capsule Take 1 capsule (5 mg total) by mouth daily in the afternoon. 90 capsule 1   traZODone (DESYREL) 50 MG tablet Take 1 tablet (50 mg total) by mouth at bedtime. 90 tablet 1   Empagliflozin-metFORMIN HCl ER (SYNJARDY XR) 06-998 MG TB24 Take 1 tablet by mouth 2 (two) times daily with a meal. 180 tablet 1   Study - SOS-AMI - selatogrel 16 mg/0.5 mL or placebo  SQ injection (PI-Christopher) Inject 0.5 mLs (16 mg total) into the skin once for 1 dose. Inject 0.5 mL subcutaneously in the abdomen or thigh if you experience  symptoms of a heart attack. Call 911 immediately  and seek emergency medical support. (Patient not taking: Reported on 09/22/2020) 0.5 mL 0   No facility-administered medications prior to visit.    No Known Allergies  Review of Systems  Constitutional: Negative.   Genitourinary:  Positive for dysuria, frequency, penile pain and penile swelling.      Objective:    Physical Exam  There were no vitals taken for this visit. Wt Readings from Last 3 Encounters:  11/05/20 297 lb 1.3 oz (134.8 kg)  10/21/20 296 lb (134.3 kg)  09/16/20 (!) 305 lb 9.6 oz (138.6 kg)    Health Maintenance Due  Topic Date Due   FOOT EXAM  Never done   OPHTHALMOLOGY EXAM  08/13/2020   COVID-19 Vaccine (4 - Booster for Moderna series) 09/30/2020    There are no preventive care reminders to display for this patient.   Lab Results  Component Value Date   TSH 1.966 04/12/2020   Lab Results  Component Value Date   WBC 3.2 (L) 10/19/2020   HGB 12.5 (L) 10/19/2020   HCT 37.6 10/19/2020   MCV 92 10/19/2020   PLT 191 10/19/2020   Lab Results  Component Value Date   NA 142 10/19/2020   K 4.5 10/19/2020   CO2 25 10/19/2020   GLUCOSE 104 (H) 10/19/2020   BUN 10 10/19/2020   CREATININE 1.14 10/19/2020   BILITOT 0.8 10/19/2020   ALKPHOS 38 (L) 10/19/2020   AST 23 10/19/2020   ALT 21 10/19/2020   PROT 6.7 10/19/2020   ALBUMIN 4.3 10/19/2020   CALCIUM 8.9 10/19/2020   ANIONGAP 8 04/13/2020   EGFR 67 10/19/2020   Lab Results  Component Value Date   CHOL 105 10/19/2020   Lab Results  Component Value Date   HDL 40 10/19/2020  Lab Results  Component Value Date   LDLCALC 45 10/19/2020   Lab Results  Component Value Date   TRIG 107 10/19/2020   Lab Results  Component Value Date   CHOLHDL 3.7 04/12/2020   Lab Results  Component  Value Date   HGBA1C 7.0 (H) 10/19/2020       Assessment & Plan:   Problem List Items Addressed This Visit       Endocrine   Type 2 diabetes mellitus treated without insulin ([place])    -STOP synjardy d/t UTI -RESTARTED metaglip      Relevant Medications   glipiZIDE-metformin (METAGLIP) 2.5-500 MG tablet     Genitourinary   UTI (urinary tract infection) - Primary    -he will bring in urine sample tomorrow; will get U/A and culture -Rx. cipro and Azo -he states he has penile swelling; may need antifungal if this doesn't resolve with cipro alone       Relevant Medications   phenazopyridine (PYRIDIUM) 97 MG tablet   ciprofloxacin (CIPRO) 500 MG tablet     Meds ordered this encounter  Medications   phenazopyridine (PYRIDIUM) 97 MG tablet    Sig: Take 1 tablet (97 mg total) by mouth 3 (three) times daily as needed for pain.    Dispense:  10 tablet    Refill:  0   ciprofloxacin (CIPRO) 500 MG tablet    Sig: Take 1 tablet (500 mg total) by mouth 2 (two) times daily for 10 days.    Dispense:  20 tablet    Refill:  0   glipiZIDE-metformin (METAGLIP) 2.5-500 MG tablet    Sig: TAKE 2 TABLETS BY MOUTH IN THE MORNING AND 1 TABLET AT NIGHT    Dispense:  270 tablet    Refill:  1    **Patient requests 90 days supply**   Date:  11/09/2020   Location of Patient: Home Location of Provider: Office Consent was obtain for visit to be over via telehealth. I verified that I am speaking with the correct person using two identifiers.  I connected with  Jettie Lazare Sales on 11/09/20 via telephone and verified that I am speaking with the correct person using two identifiers.   I discussed the limitations of evaluation and management by telemedicine. The patient expressed understanding and agreed to proceed.  Time spent: 11 min     [name], NP

## 2020-11-10 ENCOUNTER — Telehealth: Payer: Self-pay

## 2020-11-10 ENCOUNTER — Other Ambulatory Visit: Payer: Self-pay | Admitting: Nurse Practitioner

## 2020-11-10 DIAGNOSIS — N390 Urinary tract infection, site not specified: Secondary | ICD-10-CM | POA: Diagnosis not present

## 2020-11-10 DIAGNOSIS — F419 Anxiety disorder, unspecified: Secondary | ICD-10-CM

## 2020-11-10 DIAGNOSIS — G8929 Other chronic pain: Secondary | ICD-10-CM

## 2020-11-10 DIAGNOSIS — M545 Low back pain, unspecified: Secondary | ICD-10-CM

## 2020-11-10 LAB — URINE CULTURE: Organism ID, Bacteria: NO GROWTH

## 2020-11-10 MED ORDER — ALPRAZOLAM 0.5 MG PO TABS: 0.5000 mg | ORAL_TABLET | Freq: Three times a day (TID) | ORAL | 2 refills | Status: DC

## 2020-11-10 MED ORDER — HYDROCODONE-ACETAMINOPHEN 10-325 MG PO TABS: 1.0000 | ORAL_TABLET | Freq: Four times a day (QID) | ORAL | 0 refills | Status: AC | PRN

## 2020-11-10 NOTE — Telephone Encounter (Signed)
Patient came by the office need med refills  losartan (COZAAR) 25 MG tablet   HYDROcodone-acetaminophen (NORCO) 10-325 MG tablet  ALPRAZolam (XANAX) 0.5 MG tablet  [facility]

## 2020-11-10 NOTE — Telephone Encounter (Signed)
I sent in the xanax and norco.

## 2020-11-12 ENCOUNTER — Encounter: Payer: Self-pay | Admitting: *Deleted

## 2020-11-12 DIAGNOSIS — Z006 Encounter for examination for normal comparison and control in clinical research program: Secondary | ICD-10-CM

## 2020-11-12 NOTE — Research (Signed)
[id number], [id number]     FOLLOW-UP PHONE CALLS  SUBJECT ID:  [id number] [name], RN  Trainer's signature: on Delegation of Authority Log Date of the Follow up call:   12-Nov-2020    [id number]  Visit Number: 5 Start time of the Follow up call: 1215         End time of call: 1225  I spoke to Steven Stevens and he is doing well overall. He has had a UTI recently and has been on meds as per his PCP, Steven Stevens. He states that he is feeling much better today. He was also trialed on Synjardy but could not tolerate it and he remains on original Glipizide-Metformin; no changes have been made to his anticoags or antiplatelet medications. He answered all SOS-AMI follow up questions appropriately.   I will contact his daughter, Steven Stevens, regarding his autoinjector switch out d/t expiration. She works here in [place] so it will be convenient to come by and do this exchange.   - CONTACT  Q1;  Was the follow up phone call done with the subject?  [x]   YES  []   NO           If NO, check the following:      Q2:    Was the follow up phone call done with a family member               Or caregiver?    []   YES   [x]   NO          Make note about reasons ___daughter at work_____________    - MEDICAL CONDITION      Q3:  Did any of the following occur?   []   Death   NONE    []   Hospitalization (any cause)         []   Use of autoinjector  Make note about the type of event, and when it occurred. In case of hospitalization: the Location of the hospital and/or the treating physician's contact details  ____________ ____________________________________________________________________ ____________________________________________________________________  Note: remember to report any SAE/AE which has occurred within 30 days after any  injection of the study drug on relevant eCRF forms.   Q4:  Did the subject develop any condition which is an           exclusion criterion?  []   YES  [x]   NO   Take note about the occurrence of any exclusion criterion after randomization: _________________________________________________________________ __________________________________________________________________  Q5: Was there any change in subject's antithrombotic therapy?   []   YES  [x]   NO     Take not about any change of antithrombotic treatment: _______________________ ____________________________________________________________________ ____________________________________________________________________      Lezlie Octave OF THE STUDY-SPECFIC TRAINING  Q6: Did the subject correctly reply to the following questions?       A.  What are common heart attack symptoms?      [x]   YES   []   NO      B.  What has to be done in case any of those symptoms occurs? [x]   YES  []   NO      C.  What are the main steps to perform a self-injection?  [x]   YES  []   NO             If NO, then report which step/s was/were missing:        []   Choose injection site (abdomen or thigh)       []   Twist cap off       []   Pinch skin and place the study autoinjector       []   Firmly push down and hold for 3 seconds       D. What has to be done immediately after an injection?  [x]   YES   []   NO          If NO, then report which step/s was/were missing:       []   Call  for emergency medical help       []   Show the autoinjector to the emergency medical responder       E.  Does the subject recall where s/he keeps/ stores the autoinjectors? [x]  YES  []  NO          Note the place of storage and any corrective explanation if needed below __________________________________________________________________ __________________________________________________________________  - TRAINING REFRESHER   Q7:  Is a training refresher needed?      []  YES  [x]  NO        If YES, indicate items that have to be refreshed. More than one may apply:               []   Heart attack  symptoms             []   Actions to be taken following heart attack symptoms             []   Steps to perform the self-injection and follow-up actions to be taken   []   Other, Specify  _________________________________________     Form Based on [facility]ORS[facility] SOS-AMI_CRF_Version 5.0_27Oct2021 [id number], 62,            and eCRF  Version 5.0-  03-Dec-2019    Current Outpatient Medications:    acetaminophen (TYLENOL) 500 MG tablet, Take 325 mg by mouth daily., Disp: , Rfl:    ALPRAZolam (XANAX) 0.5 MG tablet, Take 1 tablet (0.5 mg total) by mouth in the morning, at noon, and at bedtime., Disp: 90 tablet, Rfl: 2   aspirin EC 81 MG tablet, Take 81 mg by mouth daily., Disp: , Rfl:    atorvastatin (LIPITOR) 80 MG tablet, TAKE 1 TABLET ONCE DAILY., Disp: 30 tablet, Rfl: 6   ciprofloxacin (CIPRO) 500 MG tablet, Take 1 tablet (500 mg total) by mouth 2 (two) times daily for 10 days., Disp: 20 tablet, Rfl: 0   clopidogrel (PLAVIX) 75 MG tablet, Take 1 tablet (75 mg total) by mouth daily., Disp: 90 tablet, Rfl: 3   escitalopram (LEXAPRO) 10 MG tablet, Take 1 tablet (10 mg total) by mouth daily in the afternoon., Disp: 30 tablet, Rfl: 3   ezetimibe (ZETIA) 10 MG tablet, Take 1 tablet (10 mg total) by mouth daily., Disp: 90 tablet, Rfl: 3   glipiZIDE-metformin (METAGLIP) 2.5-500 MG tablet, TAKE 2 TABLETS BY MOUTH IN THE MORNING AND 1 TABLET AT NIGHT, Disp: 270 tablet, Rfl: 1   HYDROcodone-acetaminophen (NORCO) 10-325 MG tablet, Take 1 tablet by mouth every 6 (six) hours as needed., Disp: 120 tablet, Rfl: 0   hydrocortisone (ANUSOL-HC) 2.5 % rectal cream, Place 1 application rectally 4 (four) times daily as needed (hemorrhoids/discomfort). , Disp: , Rfl:    LINZESS 145 MCG CAPS capsule, Take 145 mcg by mouth daily as needed (constipation.). , Disp: , Rfl:    losartan (COZAAR) 25 MG tablet, Take 1 tablet (25 mg total) by mouth daily., Disp: 90 tablet, Rfl: 0   metoprolol tartrate (LOPRESSOR) 25  MG tablet, Take 0.5  tablets (12.5 mg total) by mouth 2 (two) times daily., Disp: 90 tablet, Rfl: 3   Misc Natural Products (NEURIVA PO), Take 1 tablet by mouth daily in the afternoon., Disp: , Rfl:    nitroGLYCERIN (NITROSTAT) 0.4 MG SL tablet, Place 1 tablet (0.4 mg total) under the tongue every 5 (five) minutes x 3 doses as needed for chest pain., Disp: 25 tablet, Rfl: 3   phenazopyridine (PYRIDIUM) 97 MG tablet, Take 1 tablet (97 mg total) by mouth 3 (three) times daily as needed for pain., Disp: 10 tablet, Rfl: 0   Study - SOS-AMI - selatogrel 16 mg/0.5 mL or placebo SQ injection (PI-Christopher), Inject 0.5 mLs (16 mg total) into the skin once for 1 dose. Inject 0.5 mL subcutaneously in the abdomen or thigh if you experience  symptoms of a heart attack. Call 911 immediately  and seek emergency medical support. (Patient not taking: Reported on 09/22/2020), Disp: 0.5 mL, Rfl: 0   sulfamethoxazole-trimethoprim (BACTRIM DS) 800-160 MG tablet, Take 1 tablet by mouth 2 (two) times daily., Disp: 10 tablet, Rfl: 0   terazosin (HYTRIN) 5 MG capsule, Take 1 capsule (5 mg total) by mouth daily in the afternoon., Disp: 90 capsule, Rfl: 1   traZODone (DESYREL) 50 MG tablet, Take 1 tablet (50 mg total) by mouth at bedtime., Disp: 90 tablet, Rfl: 1

## 2020-11-14 LAB — URINE CULTURE

## 2020-11-15 NOTE — Progress Notes (Signed)
Urine culture was negative. Has his condition improved with antibiotics?

## 2020-11-17 ENCOUNTER — Ambulatory Visit (INDEPENDENT_AMBULATORY_CARE_PROVIDER_SITE_OTHER): Payer: Medicare Other | Admitting: Pharmacist

## 2020-11-17 DIAGNOSIS — E785 Hyperlipidemia, unspecified: Secondary | ICD-10-CM

## 2020-11-17 DIAGNOSIS — I2510 Atherosclerotic heart disease of native coronary artery without angina pectoris: Secondary | ICD-10-CM

## 2020-11-17 DIAGNOSIS — I10 Essential (primary) hypertension: Secondary | ICD-10-CM

## 2020-11-17 DIAGNOSIS — E119 Type 2 diabetes mellitus without complications: Secondary | ICD-10-CM

## 2020-11-17 NOTE — Chronic Care Management (AMB) (Signed)
Chronic Care Management Pharmacy Note  11/17/2020 Name:  Steven Stevens MRN:  [id number] DOB:  1945-05-20  Summary:  Patient recently had UTI symptoms (burning and itching with urination) along with swelling of penis unable to retract foreskin. UA and urine culture were negative for bacterial growth; unclear if fungal. Notes improvement in symptoms since stopping Synjardy. He is nearing completion of antibiotic course. Has restarted Metaglip per PCP instructions.  Intolerances:  Possible UTI or genital mycotic infection with Synjardy. Added to medication allergy list Reports hypoglycemic symptoms (shaky, sweaty) yesterday with blood glucose of 89 when he took metaglip and did not eat breakfast. Patient instructed to eat breakfast if taking metaglip or skip dose if not eating to reduce likelihood of hypoglycemia. If patient continues to skip breakfast, may have to adjust metaglip dosing to lunch and dinner instead of breakfast and dinner.   Subjective: Steven Stevens is an 75 y.o. year old male who is a primary patient of [name], NP.  The CCM team was consulted for assistance with disease management and care coordination needs.    Engaged with patient by telephone for follow up visit in response to provider referral for pharmacy case management and/or care coordination services.   Consent to Services:  The patient was given information about Chronic Care Management services, agreed to services, and gave verbal consent prior to initiation of services.  Please see initial visit note for detailed documentation.   Patient Care Team: [name], NP as PCP - General (Nurse Practitioner) Fay Records, MD as PCP - Cardiology (Cardiology) Fay Records, MD as Attending Physician (Cardiology) Daneil Dolin, MD as Consulting Physician (Gastroenterology) Beryle Lathe, [facility] (Pharmacist)  Objective:  Lab Results  Component Value Date   CREATININE 1.14 10/19/2020    CREATININE 0.98 07/19/2020   CREATININE 1.19 04/13/2020    Lab Results  Component Value Date   HGBA1C 7.0 (H) 10/19/2020   Last diabetic Eye exam:  Lab Results  Component Value Date/Time   HMDIABEYEEXA No Retinopathy 08/14/2019 12:00 AM    Last diabetic Foot exam: No results found for: HMDIABFOOTEX      Component Value Date/Time   CHOL 105 10/19/2020 1536   TRIG 107 10/19/2020 1536   HDL 40 10/19/2020 1536   CHOLHDL 3.7 04/12/2020 1744   VLDL 23 04/12/2020 1744   LDLCALC 45 10/19/2020 1536    Hepatic Function Latest Ref Rng & Units 10/19/2020 07/19/2020 04/13/2020  Total Protein 6.0 - 8.5 g/dL 6.7 6.9 5.8(L)  Albumin 3.7 - 4.7 g/dL 4.3 4.1 3.2(L)  AST 0 - 40 IU/L 23 15 36  ALT 0 - 44 IU/L _0 Alk Phosphatase 44 - 121 IU/L 38(L) 40(L) 25(L)  Total Bilirubin 0.0 - 1.2 mg/dL 0.8 0.6 0.9    Lab Results  Component Value Date/Time   TSH 1.966 04/12/2020 05:44 PM   TSH 1.560 02/11/2020 02:33 PM   FREET4 1.09 02/11/2020 02:33 PM    CBC Latest Ref Rng & Units 10/19/2020 07/19/2020 04/13/2020  WBC 3.4 - 10.8 x10E3/uL 3.2(L) 8.5 9.1  Hemoglobin 13.0 - 17.7 g/dL 12.5(L) 12.2(L) 11.5(L)  Hematocrit 37.5 - 51.0 % 37.6 37.1(L) 34.0(L)  Platelets 150 - 450 x10E3/uL 191 233 210    No results found for: VD25OH  Clinical ASCVD: Yes  The ASCVD Risk score (Arnett DK, et al., 2019) failed to calculate for the following reasons:   The patient has a prior MI or stroke diagnosis  Social History   Tobacco Use  Smoking Status Former   Packs/day: 0.10   Years: 10.00   Pack years: 1.00   Types: Cigarettes   Quit date: 01/31/2004   Years since quitting: 16.8  Smokeless Tobacco Never   BP Readings from Last 3 Encounters:  11/05/20 114/69  10/21/20 131/66  09/16/20 (!) 144/70   Pulse Readings from Last 3 Encounters:  11/05/20 66  10/21/20 72  09/16/20 73   Wt Readings from Last 3 Encounters:  11/05/20 297 lb 1.3 oz (134.8 kg)  10/21/20 296 lb (134.3 kg)  09/16/20 (!)  305 lb 9.6 oz (138.6 kg)    Assessment: Review of patient past medical history, allergies, medications, health status, including review of consultants reports, laboratory and other test data, was performed as part of comprehensive evaluation and provision of chronic care management services.   SDOH:  (Social Determinants of Health) assessments and interventions performed:    CCM Care Plan  Allergies  Allergen Reactions   Synjardy Xr [Empagliflozin-Metformin Hcl Er] Other (See Comments)    Itching/burning urination    Medications Reviewed Today     Reviewed by Beryle Lathe, [facility] (Pharmacist) on 11/17/20 at 1327  Med List Status: <None>   Medication Order Taking? Sig Documenting Provider Last Dose Status Informant  acetaminophen (TYLENOL) 500 MG tablet [id number] Yes Take 325 mg by mouth daily. [provider] Taking Active Self  ALPRAZolam Duanne Moron) 0.5 MG tablet [id number] Yes Take 1 tablet (0.5 mg total) by mouth in the morning, at noon, and at bedtime. [name], NP Taking Active   aspirin EC 81 MG tablet [id number] Yes Take 81 mg by mouth daily. [provider] Taking Active Self  atorvastatin (LIPITOR) 80 MG tablet [id number] Yes TAKE 1 TABLET ONCE DAILY. Fay Records, MD Taking Active   ciprofloxacin (CIPRO) 500 MG tablet [id number] Yes Take 1 tablet (500 mg total) by mouth 2 (two) times daily for 10 days. [name], NP Taking Active   clopidogrel (PLAVIX) 75 MG tablet [id number] Yes Take 1 tablet (75 mg total) by mouth daily. [name], NP Taking Active   escitalopram (LEXAPRO) 10 MG tablet [id number] Yes Take 1 tablet (10 mg total) by mouth daily in the afternoon. [name], NP Taking Active   ezetimibe (ZETIA) 10 MG tablet [id number] Yes Take 1 tablet (10 mg total) by mouth daily. Fay Records, MD Taking Active   glipiZIDE-metformin [facility]) 2.5-500 MG tablet [id number] Yes TAKE 2 TABLETS BY MOUTH IN THE MORNING AND 1 TABLET AT [place] Ghee, NP Taking Active   HYDROcodone-acetaminophen [facility]) 10-325 MG tablet [id number] Yes Take 1 tablet by mouth every 6 (six) hours as needed. [name], NP Taking Active   hydrocortisone (ANUSOL-HC) 2.5 % rectal cream [id number] Yes Place 1 application rectally 4 (four) times daily as needed (hemorrhoids/discomfort).  [provider] Taking Active Self  LINZESS 145 MCG CAPS capsule [id number] Yes Take 145 mcg by mouth daily as needed (constipation.).  [provider] Taking Active Self  losartan (COZAAR) 25 MG tablet [id number] Yes Take 1 tablet (25 mg total) by mouth daily. [name], NP Taking Active   metoprolol tartrate (LOPRESSOR) 25 MG tablet [id number] Yes Take 0.5 tablets (12.5 mg total) by mouth 2 (two) times daily. [name], NP Taking Active   Misc Natural Products (NEURIVA PO) [id number] Yes Take 1 tablet by mouth daily in the afternoon. [provider] Taking Active Self  nitroGLYCERIN (NITROSTAT) 0.4 MG SL tablet [id number] No Place 1 tablet (0.4 mg total) under the tongue every 5 (five) minutes x 3 doses as needed for chest pain.  Patient not taking: Reported on 11/17/2020   Barrett, Evelene Croon, PA-C Not Taking Active   phenazopyridine (PYRIDIUM) 97 MG tablet [id number] Yes Take 1 tablet (97 mg total) by mouth 3 (three) times daily as needed for pain. [name], NP Taking Active   Study - SOS-AMI - selatogrel 16 mg/0.5 mL or placebo SQ injection (PI-Lorianne Malbrough) [id number]  Inject 0.5 mLs (16 mg total) into the skin once for 1 dose. Inject 0.5 mL subcutaneously in the abdomen or thigh if you experience  symptoms of a heart attack. Call 911 immediately  and seek emergency medical support.  Patient not taking: Reported on 09/22/2020   Buford Dresser, MD  Expired 04/26/20 2359 Pharmacy Records  terazosin (HYTRIN) 5 MG capsule [id number] Yes Take 1 capsule (5 mg total) by mouth daily in the afternoon. [name], NP Taking Active    traZODone (DESYREL) 50 MG tablet [id number] Yes Take 1 tablet (50 mg total) by mouth at bedtime. [name], NP Taking Active             Patient Active Problem List   Diagnosis Date Noted   UTI (urinary tract infection) 11/09/2020   Neutropenia ([place]) 10/21/2020   High risk medication use 07/22/2020   Chronic back pain 04/23/2020   Non-ST elevation (NSTEMI) myocardial infarction [facility]) 04/12/2020   NSTEMI (non-ST elevated myocardial infarction) ([place]) 04/12/2020   Insomnia, unspecified 01/12/2020   Anxiety 01/12/2020   Encounter to establish care 01/12/2020   Fatigue 01/12/2020   Immunization due 01/12/2020   Bradycardia 12/17/2019   Constipation 10/01/2019   Family history of colon cancer 07/20/2016   Hx of adenomatous colonic polyps 07/20/2016   Normocytic anemia 07/20/2016   Chronic total occlusion of LAD s/p CTO/PCI wiht DES 04/14/15 04/15/2015   Type 2 diabetes mellitus treated without insulin ([place]) 03/31/2015   Essential hypertension 10/29/2013   Morbid obesity ([place]) 10/29/2013   Dyslipidemia 05/31/2012   CAD S/P RCA DES '05 and 03/11/15 05/31/2012    Immunization History  Administered Date(s) Administered   Fluad Quad(high Dose 65+) 10/21/2020   Influenza-Unspecified 11/06/2017   Moderna Sars-Covid-2 Vaccination 03/14/2019, 04/12/2019, 07/08/2020   Pneumococcal Conjugate-13 06/21/2020   Td 03/21/2011   Zoster Recombinat (Shingrix) 01/12/2020, 04/23/2020    Conditions to be addressed/monitored: CAD, HTN, HLD, and DMII  Care Plan : Medication Management  Updates made by Beryle Lathe, [place] since 11/17/2020 12:00 AM     Problem: CAD/NSTEMI, Diabetes, Hypertension, Hyperlipidemia   Priority: High  Onset Date: 09/22/2020     Goal: Disease Progression Prevention   Start Date: 09/22/2020  Expected End Date: 12/21/2020  Recent Progress: On track  Priority: High  Note:   Current Barriers:  Unable to achieve control of hyperlipidemia  Pharmacist  Clinical Goal(s):  Over the next 90 days, patient will Achieve control of hyperlipidemia as evidenced by improved LDL through collaboration with PharmD and provider.   Interventions: 1:1 collaboration with [name], NP regarding development and update of comprehensive plan of care as evidenced by provider attestation and co-signature Inter-disciplinary care team collaboration (see longitudinal plan of care) Comprehensive medication review performed; medication list updated in electronic medical record  Type 2 Diabetes: Current medications: metformin and glipizide (Metaglip) 500-2.5 mg (2 tablets every morning and 1 tablet every evening) Patient recently had UTI symptoms (  burning and itching with urination) along with swelling of penis unable to retract foreskin. UA and urine culture were negative for bacterial growth; unclear if fungal. Notes improvement in symptoms since stopping Synjardy. He is nearing completion of antibiotic course. Has restarted Metaglip per PCP instructions.  Intolerances:  Possible UTI or genital mycotic infection with Synjardy. Added to medication allergy list Taking medications as directed: yes Side effects thought to be attributed to current medication regimen: no Reports hypoglycemic symptoms (shaky, sweaty) yesterday with blood glucose of 89 when he took metaglip and did not eat breakfast. Patient instructed to eat breakfast if taking metaglip or skip dose if not eating to reduce likelihood of hypoglycemia Hypoglycemia prevention: not indicated at this time Current meal patterns: lunch: sandwich, baked/grilled chicken, vegetables, sugar wafers, and mac&cheese ; dinner: baked/grilled chicken, potatoes, pasta, and vegetables; snacks: cookies and fruit; drinks: water, diet soda, and sweet tea Current exercise:  walks 2-3 times per week but is limited due to back pain Controlled; Most recent A1c at goal of <7% per ADA guidelines Current glucose readings: fasting  blood glucose: within goal range of 80-130 mg/dL per ADA guidelines, post prandial glucose: at goal of <180 mg/dL per ADA guidelines Introduction to diabetes basic facts Medication: Identify diabetes medication and when best taken Monitoring: target blood sugar range, when to test Hypoglycemia: I have discussed with the patient how to treat hypoglycemia by the rule of 15; eat/drink 15g of sugar in the form of glucose tabs, 4 ounces of juice or soda and recheck fingerstick glucose in 15 minutes. Chocolate bars and ice cream should be avoided because fat delays carbohydrate digestion and absorption. Retreat if glucose remains low. Driving should cease until glucose is normal. Instructed to monitor blood sugars once a day at the following times: fasting (at least 8 hours since last food consumption), bedtime, and whenever patient experiences symptoms of hypo/hyperglycemia Encouraged regular aerobic exercise with a goal of 30 minutes five times per week (150 minutes per week)  Hypertension: Current medications: losartan 25 mg by mouth once daily and metoprolol tartrate 12.5 mg by mouth twice daily Intolerances: none Taking medications as directed: yes Side effects thought to be attributed to current medication regimen: no Denies dizziness, lightheadedness, blurred vision, and headache Current exercise:  walks 2-3 times per week Home blood pressure readings (Arm Cuff Adult Large): checks daily; normally 120s/70s Blood pressure under good control. Blood pressure is at goal of <130/80 mmHg per 2017 AHA/ACC guidelines. Continue losartan 25 mg by mouth once daily and metoprolol tartrate 12.5 mg by mouth twice daily Encourage dietary sodium restriction/DASH diet Recommend regular aerobic exercise Recommend home blood pressure monitoring, to bring results in next visit Discussed need for and importance of continued work on weight loss If additional blood pressure lowering is necessary, could consider a  low does combination ARB-CCB such as olmesartan-amlodipine  CAD/NSTEMI/Hyperlipidemia: Current medications: atorvastatin 80 mg by mouth once daily and ezetimibe 10 mg by mouth once daily.  Antiplatelet therapy: aspirin 81 mg by mouth daily and clopidogrel 75 mg by mouth daily Also on beta blocker and ARB Intolerances: none Taking medications as directed: yes Side effects thought to be attributed to current medication regimen: no LDL Controlled; LDL at goal of <55 due to extreme risk given established clinical ASCVD + diabetes per 2020 AACE/ACE guidelines. Triglycerides at goal of <150 per 2020 AACE/ACE guidelines. Continue atorvastatin 80 mg by mouth once daily and ezetimibe 10 mg by mouth once daily Encourage dietary reduction of high  fat containing foods such as butter, nuts, bacon, egg yolks, etc. Recommend regular aerobic exercise Discussed need for and importance of continued work on weight loss Re-check lipid panel in 4-12 weeks  Patient Goals/Self-Care Activities Over the next 90 days, patient will:  Take medications as prescribed Check blood sugar once a day at the following times: fasting (at least 8 hours since last food consumption) and whenever patient experiences symptoms of hypo/hyperglycemia, document, and provide at future appointments Check blood pressure at least once daily, document, and provide at future appointments Engage in dietary modifications by fewer sweetened foods & beverages and better food choices  Follow Up Plan: Telephone follow up appointment with care management team member scheduled for: 12/28/20        Medication Assistance: None required.  Patient affirms current coverage meets needs.  Patient's preferred pharmacy is:  [place], [place] [address] [place] [address] Phone: [phone] Fax: [phone]  Follow Up:  Patient agrees to Care Plan and Follow-up.  Plan: Telephone follow up appointment  with care management team member scheduled for:  12/28/20  Kennon Holter, PharmD Clinical Pharmacist [facility] [phone]

## 2020-11-17 NOTE — Patient Instructions (Signed)
Steven Stevens January,  It was great to talk to you today!  Please call me with any questions or concerns.   Visit Information  PATIENT GOALS:  Goals Addressed             This Visit's Progress    Medication Management       Patient Goals/Self-Care Activities Over the next 90  days, patient will:  Take medications as prescribed Check blood sugar once a day at the following times: fasting (at least 8 hours since last food consumption) and whenever patient experiences symptoms of hypo/hyperglycemia, document, and provide at future appointments Check blood pressure at least once daily, document, and provide at future appointments Engage in dietary modifications by fewer sweetened foods & beverages and better food choices          Patient verbalizes understanding of instructions provided today and agrees to view in [place].   Telephone follow up appointment with care management team member scheduled for:12/28/20  Kennon Holter, PharmD Clinical Pharmacist [facility] [phone]

## 2020-11-19 ENCOUNTER — Encounter: Payer: Self-pay | Admitting: *Deleted

## 2020-11-19 DIAGNOSIS — Z006 Encounter for examination for normal comparison and control in clinical research program: Secondary | ICD-10-CM

## 2020-11-19 NOTE — Research (Signed)
Patient came by and exchanged his expiring autoinjectors ([id number], [id number]) for the new ones Kit #s [phone] and [phone]. He is doing well overall and reports no AEs, SAEs, Hospitalizations or Urgent Care visits. Steven Stevens answered all the Study questions appropriately. He has my contact information should he have any questions or concerns.

## 2020-12-06 DIAGNOSIS — Z9861 Coronary angioplasty status: Secondary | ICD-10-CM

## 2020-12-06 DIAGNOSIS — E119 Type 2 diabetes mellitus without complications: Secondary | ICD-10-CM | POA: Diagnosis not present

## 2020-12-06 DIAGNOSIS — E785 Hyperlipidemia, unspecified: Secondary | ICD-10-CM

## 2020-12-06 DIAGNOSIS — I2510 Atherosclerotic heart disease of native coronary artery without angina pectoris: Secondary | ICD-10-CM | POA: Diagnosis not present

## 2020-12-06 DIAGNOSIS — I10 Essential (primary) hypertension: Secondary | ICD-10-CM | POA: Diagnosis not present

## 2020-12-10 ENCOUNTER — Other Ambulatory Visit: Payer: Self-pay

## 2020-12-10 ENCOUNTER — Telehealth: Payer: Self-pay | Admitting: Nurse Practitioner

## 2020-12-10 DIAGNOSIS — F419 Anxiety disorder, unspecified: Secondary | ICD-10-CM

## 2020-12-10 MED ORDER — ESCITALOPRAM OXALATE 10 MG PO TABS: 10.0000 mg | ORAL_TABLET | Freq: Every day | ORAL | 1 refills | Status: DC

## 2020-12-10 NOTE — Telephone Encounter (Signed)
Patient requesting hydrocodone

## 2020-12-10 NOTE — Telephone Encounter (Signed)
Please send in hydrocodone and lexapro to the pharmacy

## 2020-12-13 ENCOUNTER — Telehealth: Payer: Self-pay | Admitting: Nurse Practitioner

## 2020-12-13 NOTE — Telephone Encounter (Signed)
Pt called in for refills on Hydrocodone

## 2020-12-14 ENCOUNTER — Other Ambulatory Visit: Payer: Self-pay | Admitting: Family Medicine

## 2020-12-14 ENCOUNTER — Other Ambulatory Visit: Payer: Self-pay | Admitting: Internal Medicine

## 2020-12-14 DIAGNOSIS — M545 Low back pain, unspecified: Secondary | ICD-10-CM

## 2020-12-14 DIAGNOSIS — G894 Chronic pain syndrome: Secondary | ICD-10-CM

## 2020-12-14 MED ORDER — HYDROCODONE-ACETAMINOPHEN 10-325 MG PO TABS
1.0000 | ORAL_TABLET | Freq: Four times a day (QID) | ORAL | 0 refills | Status: DC | PRN
Start: 2020-12-14 — End: 2021-01-10

## 2020-12-14 NOTE — Telephone Encounter (Signed)
Refilled by dr patel today

## 2020-12-28 ENCOUNTER — Ambulatory Visit (INDEPENDENT_AMBULATORY_CARE_PROVIDER_SITE_OTHER): Payer: Medicare Other | Admitting: Pharmacist

## 2020-12-28 DIAGNOSIS — I2510 Atherosclerotic heart disease of native coronary artery without angina pectoris: Secondary | ICD-10-CM

## 2020-12-28 DIAGNOSIS — Z9861 Coronary angioplasty status: Secondary | ICD-10-CM

## 2020-12-28 DIAGNOSIS — I10 Essential (primary) hypertension: Secondary | ICD-10-CM

## 2020-12-28 DIAGNOSIS — E785 Hyperlipidemia, unspecified: Secondary | ICD-10-CM

## 2020-12-28 DIAGNOSIS — E119 Type 2 diabetes mellitus without complications: Secondary | ICD-10-CM

## 2020-12-28 NOTE — Chronic Care Management (AMB) (Signed)
Chronic Care Management Pharmacy Note  12/28/2020 Name:  Steven Stevens MRN:  [id number] DOB:  16-Sep-1945  Summary:  Type 2 Diabetes: Current medications: metformin and glipizide (Metaglip) 500-2.5 mg (2 tablets every morning and 1 tablet every evening) Intolerances: Possible UTI or genital mycotic infection with Synjardy despite no bacterial growth. Urinary symptoms have returned to normal after discontinuation of Synjardy. Could consider GLP-1 agonist but may not be the most ideal given patient's history of IBS-C and chronic opioid therapy. May be more likely to experience adverse events.  Hypertension and hyperlipidemia under good control. No other acute concerns at this time. Will decrease follow-up to every 3 months.   Subjective: Steven Stevens is an 75 y.o. year old male who is a primary patient of [name], NP.  The CCM team was consulted for assistance with disease management and care coordination needs.    Engaged with patient by telephone for follow up visit in response to provider referral for pharmacy case management and/or care coordination services.   Consent to Services:  The patient was given information about Chronic Care Management services, agreed to services, and gave verbal consent prior to initiation of services.  Please see initial visit note for detailed documentation.   Patient Care Team: [name], NP as PCP - General (Nurse Practitioner) Fay Records, MD as PCP - Cardiology (Cardiology) Fay Records, MD as Attending Physician (Cardiology) Daneil Dolin, MD as Consulting Physician (Gastroenterology) Beryle Lathe, [facility] (Pharmacist)  Objective:  Lab Results  Component Value Date   CREATININE 1.14 10/19/2020   CREATININE 0.98 07/19/2020   CREATININE 1.19 04/13/2020    Lab Results  Component Value Date   HGBA1C 7.0 (H) 10/19/2020   Last diabetic Eye exam:  Lab Results  Component Value Date/Time   HMDIABEYEEXA No  Retinopathy 08/14/2019 12:00 AM    Last diabetic Foot exam: No results found for: HMDIABFOOTEX      Component Value Date/Time   CHOL 105 10/19/2020 1536   TRIG 107 10/19/2020 1536   HDL 40 10/19/2020 1536   CHOLHDL 3.7 04/12/2020 1744   VLDL 23 04/12/2020 1744   LDLCALC 45 10/19/2020 1536    Hepatic Function Latest Ref Rng & Units 10/19/2020 07/19/2020 04/13/2020  Total Protein 6.0 - 8.5 g/dL 6.7 6.9 5.8(L)  Albumin 3.7 - 4.7 g/dL 4.3 4.1 3.2(L)  AST 0 - 40 IU/L 23 15 36  ALT 0 - 44 IU/L _0 Alk Phosphatase 44 - 121 IU/L 38(L) 40(L) 25(L)  Total Bilirubin 0.0 - 1.2 mg/dL 0.8 0.6 0.9    Lab Results  Component Value Date/Time   TSH 1.966 04/12/2020 05:44 PM   TSH 1.560 02/11/2020 02:33 PM   FREET4 1.09 02/11/2020 02:33 PM    CBC Latest Ref Rng & Units 10/19/2020 07/19/2020 04/13/2020  WBC 3.4 - 10.8 x10E3/uL 3.2(L) 8.5 9.1  Hemoglobin 13.0 - 17.7 g/dL 12.5(L) 12.2(L) 11.5(L)  Hematocrit 37.5 - 51.0 % 37.6 37.1(L) 34.0(L)  Platelets 150 - 450 x10E3/uL 191 233 210    No results found for: VD25OH  Clinical ASCVD: Yes  The ASCVD Risk score (Arnett DK, et al., 2019) failed to calculate for the following reasons:   The patient has a prior MI or stroke diagnosis    Social History   Tobacco Use  Smoking Status Former   Packs/day: 0.10   Years: 10.00   Pack years: 1.00   Types: Cigarettes   Quit date: 01/31/2004  Years since quitting: 16.9  Smokeless Tobacco Never   BP Readings from Last 3 Encounters:  11/05/20 114/69  10/21/20 131/66  09/16/20 (!) 144/70   Pulse Readings from Last 3 Encounters:  11/05/20 66  10/21/20 72  09/16/20 73   Wt Readings from Last 3 Encounters:  11/05/20 297 lb 1.3 oz (134.8 kg)  10/21/20 296 lb (134.3 kg)  09/16/20 (!) 305 lb 9.6 oz (138.6 kg)    Assessment: Review of patient past medical history, allergies, medications, health status, including review of consultants reports, laboratory and other test data, was performed as part  of comprehensive evaluation and provision of chronic care management services.   SDOH:  (Social Determinants of Health) assessments and interventions performed:    CCM Care Plan  Allergies  Allergen Reactions   Synjardy Xr [Empagliflozin-Metformin Hcl Er] Other (See Comments)    Itching/burning urination    Medications Reviewed Today     Reviewed by Beryle Lathe, [facility] (Pharmacist) on 11/17/20 at 1327  Med List Status: <None>   Medication Order Taking? Sig Documenting Provider Last Dose Status Informant  acetaminophen (TYLENOL) 500 MG tablet [id number] Yes Take 325 mg by mouth daily. [provider] Taking Active Self  ALPRAZolam Duanne Moron) 0.5 MG tablet [id number] Yes Take 1 tablet (0.5 mg total) by mouth in the morning, at noon, and at bedtime. [name], NP Taking Active   aspirin EC 81 MG tablet [id number] Yes Take 81 mg by mouth daily. [provider] Taking Active Self  atorvastatin (LIPITOR) 80 MG tablet [id number] Yes TAKE 1 TABLET ONCE DAILY. Fay Records, MD Taking Active   ciprofloxacin (CIPRO) 500 MG tablet [id number] Yes Take 1 tablet (500 mg total) by mouth 2 (two) times daily for 10 days. [name], NP Taking Active   clopidogrel (PLAVIX) 75 MG tablet [id number] Yes Take 1 tablet (75 mg total) by mouth daily. [name], NP Taking Active   escitalopram (LEXAPRO) 10 MG tablet [id number] Yes Take 1 tablet (10 mg total) by mouth daily in the afternoon. [name], NP Taking Active   ezetimibe (ZETIA) 10 MG tablet [id number] Yes Take 1 tablet (10 mg total) by mouth daily. Fay Records, MD Taking Active   glipiZIDE-metformin [facility]) 2.5-500 MG tablet [id number] Yes TAKE 2 TABLETS BY MOUTH IN THE MORNING AND 1 TABLET AT [place] Ghee, NP Taking Active   HYDROcodone-acetaminophen [facility]) 10-325 MG tablet [id number] Yes Take 1 tablet by mouth every 6 (six) hours as needed. [name], NP Taking Active   hydrocortisone (ANUSOL-HC) 2.5 %  rectal cream [id number] Yes Place 1 application rectally 4 (four) times daily as needed (hemorrhoids/discomfort).  [provider] Taking Active Self  LINZESS 145 MCG CAPS capsule [id number] Yes Take 145 mcg by mouth daily as needed (constipation.).  [provider] Taking Active Self  losartan (COZAAR) 25 MG tablet [id number] Yes Take 1 tablet (25 mg total) by mouth daily. [name], NP Taking Active   metoprolol tartrate (LOPRESSOR) 25 MG tablet [id number] Yes Take 0.5 tablets (12.5 mg total) by mouth 2 (two) times daily. [name], NP Taking Active   Misc Natural Products (NEURIVA PO) [id number] Yes Take 1 tablet by mouth daily in the afternoon. [provider] Taking Active Self  nitroGLYCERIN (NITROSTAT) 0.4 MG SL tablet [id number] No Place 1 tablet (0.4 mg total) under the tongue every 5 (five) minutes x 3 doses as needed for chest pain.  Patient not taking:  Reported on 11/17/2020   Barrett, Evelene Croon, PA-C Not Taking Active   phenazopyridine (PYRIDIUM) 97 MG tablet [id number] Yes Take 1 tablet (97 mg total) by mouth 3 (three) times daily as needed for pain. [name], NP Taking Active   Study - SOS-AMI - selatogrel 16 mg/0.5 mL or placebo SQ injection (PI-Karalynn Cottone) [id number]  Inject 0.5 mLs (16 mg total) into the skin once for 1 dose. Inject 0.5 mL subcutaneously in the abdomen or thigh if you experience  symptoms of a heart attack. Call 911 immediately  and seek emergency medical support.  Patient not taking: Reported on 09/22/2020   Buford Dresser, MD  Expired 04/26/20 2359 Pharmacy Records  terazosin (HYTRIN) 5 MG capsule [id number] Yes Take 1 capsule (5 mg total) by mouth daily in the afternoon. [name], NP Taking Active   traZODone (DESYREL) 50 MG tablet [id number] Yes Take 1 tablet (50 mg total) by mouth at bedtime. [name], NP Taking Active             Patient Active Problem List   Diagnosis Date Noted   UTI (urinary tract  infection) 11/09/2020   Neutropenia ([place]) 10/21/2020   High risk medication use 07/22/2020   Chronic back pain 04/23/2020   Non-ST elevation (NSTEMI) myocardial infarction [facility]) 04/12/2020   NSTEMI (non-ST elevated myocardial infarction) ([place]) 04/12/2020   Insomnia, unspecified 01/12/2020   Anxiety 01/12/2020   Encounter to establish care 01/12/2020   Fatigue 01/12/2020   Immunization due 01/12/2020   Bradycardia 12/17/2019   Constipation 10/01/2019   Family history of colon cancer 07/20/2016   Hx of adenomatous colonic polyps 07/20/2016   Normocytic anemia 07/20/2016   Chronic total occlusion of LAD s/p CTO/PCI wiht DES 04/14/15 04/15/2015   Type 2 diabetes mellitus treated without insulin ([place]) 03/31/2015   Essential hypertension 10/29/2013   Morbid obesity ([place]) 10/29/2013   Dyslipidemia 05/31/2012   CAD S/P RCA DES '05 and 03/11/15 05/31/2012    Immunization History  Administered Date(s) Administered   Fluad Quad(high Dose 65+) 10/21/2020   Influenza-Unspecified 11/06/2017   Moderna Sars-Covid-2 Vaccination 03/14/2019, 04/12/2019, 07/08/2020   Pneumococcal Conjugate-13 06/21/2020   Td 03/21/2011   Zoster Recombinat (Shingrix) 01/12/2020, 04/23/2020    Conditions to be addressed/monitored: CAD, HTN, HLD, and DMII  Care Plan : Medication Management  Updates made by Beryle Lathe, [place] since 12/28/2020 12:00 AM     Problem: CAD/NSTEMI, Diabetes, Hypertension, Hyperlipidemia   Priority: High  Onset Date: 09/22/2020     Goal: Disease Progression Prevention   Start Date: 09/22/2020  Expected End Date: 12/21/2020  Recent Progress: On track  Priority: High  Note:   Current Barriers:  Unable to independently monitor therapeutic efficacy  Pharmacist Clinical Goal(s):  Over the next 90 days, patient will Achieve adherence to monitoring guidelines and medication adherence to achieve therapeutic efficacy through collaboration with PharmD and provider.    Interventions: 1:1 collaboration with [name], NP regarding development and update of comprehensive plan of care as evidenced by provider attestation and co-signature Inter-disciplinary care team collaboration (see longitudinal plan of care) Comprehensive medication review performed; medication list updated in electronic medical record  Type 2 Diabetes: Current medications: metformin and glipizide (Metaglip) 500-2.5 mg (2 tablets every morning and 1 tablet every evening) Intolerances:  Possible UTI or genital mycotic infection with Synjardy despite no bacterial growth. Urinary symptoms have returned to normal after discontinuation of Synjardy. Taking medications as directed: yes Side effects thought to be attributed  to current medication regimen: no Hypoglycemia prevention: not indicated at this time Current meal patterns: mostly skips breakfast; lunch: sandwich, baked/grilled chicken, vegetables, sugar wafers, and mac&cheese ; dinner: baked/grilled chicken, potatoes, pasta, and vegetables; snacks: cookies and fruit; drinks: water, diet soda, and sweet tea Current exercise:  walks 2-3 times per week but is limited due to back pain Controlled; Most recent A1c at goal of <7% per ADA guidelines Current glucose readings: fasting blood glucose: within goal range of 80-130 mg/dL per ADA guidelines, post prandial glucose: at goal of <180 mg/dL per ADA guidelines Introduction to diabetes basic facts Medication: Identify diabetes medication and when best taken Monitoring: target blood sugar range, when to test Hypoglycemia: I have discussed with the patient how to treat hypoglycemia by the rule of 15; eat/drink 15g of sugar in the form of glucose tabs, 4 ounces of juice or soda and recheck fingerstick glucose in 15 minutes. Chocolate bars and ice cream should be avoided because fat delays carbohydrate digestion and absorption. Retreat if glucose remains low. Driving should cease until glucose is  normal. Instructed to monitor blood sugars once a day at the following times: fasting (at least 8 hours since last food consumption), bedtime, and whenever patient experiences symptoms of hypo/hyperglycemia Encouraged regular aerobic exercise with a goal of 30 minutes five times per week (150 minutes per week) Continue current medications as above Could consider GLP-1 agonist but may not be the most ideal given patient's history of IBS-C and chronic opioid therapy. May be more likely to experience adverse events.   Hypertension: Current medications: losartan 25 mg by mouth once daily and metoprolol tartrate 12.5 mg by mouth twice daily Intolerances: none Taking medications as directed: yes Side effects thought to be attributed to current medication regimen: no Denies dizziness, lightheadedness, blurred vision, and headache Current exercise:  walks 2-3 times per week Home blood pressure readings (Arm Cuff Adult Large): checks daily; normally 120s/70s Blood pressure under good control. Blood pressure is at goal of <130/80 mmHg per 2017 AHA/ACC guidelines. Continue losartan 25 mg by mouth once daily and metoprolol tartrate 12.5 mg by mouth twice daily Encourage dietary sodium restriction/DASH diet Recommend regular aerobic exercise Recommend home blood pressure monitoring, to bring results in next visit Discussed need for and importance of continued work on weight loss If additional blood pressure lowering is necessary, could consider a low does combination ARB-CCB such as olmesartan-amlodipine  CAD/NSTEMI/Hyperlipidemia (Followed with Dr. Harrington Challenger) Current medications: atorvastatin 80 mg by mouth once daily and ezetimibe 10 mg by mouth once daily.  Antiplatelet therapy: aspirin 81 mg by mouth daily and clopidogrel 75 mg by mouth daily. DUAP planned for at least through March 2023 but may need indefinitely Also on beta blocker and ARB Intolerances: none Taking medications as directed: yes Side  effects thought to be attributed to current medication regimen: no LDL Controlled; LDL at goal of <55 due to extreme risk given established clinical ASCVD + diabetes per 2020 AACE/ACE guidelines. Triglycerides at goal of <150 per 2020 AACE/ACE guidelines. Continue atorvastatin 80 mg by mouth once daily and ezetimibe 10 mg by mouth once daily Encourage dietary reduction of high fat containing foods such as butter, nuts, bacon, egg yolks, etc. Recommend regular aerobic exercise Discussed need for and importance of continued work on weight loss  Patient Goals/Self-Care Activities Over the next 90 days, patient will:  Take medications as prescribed Check blood sugar once a day at the following times: fasting (at least 8 hours since last  food consumption) and whenever patient experiences symptoms of hypo/hyperglycemia, document, and provide at future appointments Check blood pressure at least once daily, document, and provide at future appointments Engage in dietary modifications by fewer sweetened foods & beverages and better food choices  Follow Up Plan: Telephone follow up appointment with care management team member scheduled for: 03/29/21        Medication Assistance: None required.  Patient affirms current coverage meets needs.  Patient's preferred pharmacy is:  [facility], [place] [address] [place] [address] Phone: [phone] Fax: [phone]  Follow Up:  Patient agrees to Care Plan and Follow-up.  Plan: Telephone follow up appointment with care management team member scheduled for:  03/29/21  Kennon Holter, PharmD, [place] Pharmacist [facility] [phone]

## 2020-12-28 NOTE — Patient Instructions (Signed)
Steven Stevens,  It was great to talk to you today!  Please call me with any questions or concerns.   Visit Information  Following are the goals we discussed today:  Patient Goals/Self-Care Activities Over the next 90 days, patient will:  Take medications as prescribed Check blood sugar once a day at the following times: fasting (at least 8 hours since last food consumption) and whenever patient experiences symptoms of hypo/hyperglycemia, document, and provide at future appointments Check blood pressure at least once daily, document, and provide at future appointments Engage in dietary modifications by fewer sweetened foods & beverages and better food choices  Plan: Telephone follow up appointment with care management team member scheduled for:  03/29/21  Kennon Holter, PharmD, [place] Clinical Pharmacist [facility] [phone]   Please call the care guide team at [phone] if you need to cancel or reschedule your appointment.   Patient verbalizes understanding of instructions provided today and agrees to view in [place].

## 2021-01-05 DIAGNOSIS — I10 Essential (primary) hypertension: Secondary | ICD-10-CM

## 2021-01-05 DIAGNOSIS — Z9861 Coronary angioplasty status: Secondary | ICD-10-CM | POA: Diagnosis not present

## 2021-01-05 DIAGNOSIS — E119 Type 2 diabetes mellitus without complications: Secondary | ICD-10-CM | POA: Diagnosis not present

## 2021-01-05 DIAGNOSIS — E785 Hyperlipidemia, unspecified: Secondary | ICD-10-CM | POA: Diagnosis not present

## 2021-01-05 DIAGNOSIS — I2510 Atherosclerotic heart disease of native coronary artery without angina pectoris: Secondary | ICD-10-CM

## 2021-01-07 ENCOUNTER — Other Ambulatory Visit: Payer: Self-pay | Admitting: Internal Medicine

## 2021-01-07 DIAGNOSIS — G8929 Other chronic pain: Secondary | ICD-10-CM

## 2021-01-07 DIAGNOSIS — M545 Low back pain, unspecified: Secondary | ICD-10-CM

## 2021-01-07 DIAGNOSIS — G894 Chronic pain syndrome: Secondary | ICD-10-CM

## 2021-01-12 ENCOUNTER — Other Ambulatory Visit: Payer: Self-pay | Admitting: Nurse Practitioner

## 2021-01-12 DIAGNOSIS — I10 Essential (primary) hypertension: Secondary | ICD-10-CM

## 2021-01-13 ENCOUNTER — Telehealth: Payer: Self-pay

## 2021-01-13 MED ORDER — LOSARTAN POTASSIUM 25 MG PO TABS: 25.0000 mg | ORAL_TABLET | Freq: Every day | ORAL | 0 refills | Status: DC

## 2021-01-14 NOTE — Telephone Encounter (Signed)
error 

## 2021-01-20 ENCOUNTER — Ambulatory Visit (INDEPENDENT_AMBULATORY_CARE_PROVIDER_SITE_OTHER): Payer: Medicare Other | Admitting: Nurse Practitioner

## 2021-01-20 ENCOUNTER — Encounter: Payer: Self-pay | Admitting: Nurse Practitioner

## 2021-01-20 ENCOUNTER — Other Ambulatory Visit: Payer: Self-pay

## 2021-01-20 VITALS — BP 152/92 | HR 75 | Ht 68.0 in | Wt 305.1 lb

## 2021-01-20 DIAGNOSIS — E119 Type 2 diabetes mellitus without complications: Secondary | ICD-10-CM | POA: Diagnosis not present

## 2021-01-20 DIAGNOSIS — D709 Neutropenia, unspecified: Secondary | ICD-10-CM

## 2021-01-20 DIAGNOSIS — I10 Essential (primary) hypertension: Secondary | ICD-10-CM | POA: Diagnosis not present

## 2021-01-20 DIAGNOSIS — J329 Chronic sinusitis, unspecified: Secondary | ICD-10-CM | POA: Insufficient documentation

## 2021-01-20 DIAGNOSIS — G8929 Other chronic pain: Secondary | ICD-10-CM

## 2021-01-20 DIAGNOSIS — M545 Low back pain, unspecified: Secondary | ICD-10-CM

## 2021-01-20 DIAGNOSIS — J019 Acute sinusitis, unspecified: Secondary | ICD-10-CM

## 2021-01-20 MED ORDER — AZITHROMYCIN 250 MG PO TABS: ORAL_TABLET | ORAL | 0 refills | Status: DC

## 2021-01-20 NOTE — Assessment & Plan Note (Addendum)
-  he had neutropenia previously, and was supposed to get his labs redrawn at his appt on 9/15, but he apparently didn't get them drawn despite discussing it during the appointment -repeat CBC and pathologist smear

## 2021-01-20 NOTE — Assessment & Plan Note (Signed)
-  Rx. z-pack

## 2021-01-20 NOTE — Patient Instructions (Addendum)
Please have labs drawn today ° °I will be moving to [facility] located at [address], [place], [place] [address] effective Feb 06, 2021. °If you would like to establish care with [facility] please call [phone]. °

## 2021-01-20 NOTE — Assessment & Plan Note (Signed)
Manual BP

## 2021-01-20 NOTE — Progress Notes (Addendum)
Acute Office Visit  Subjective:    Patient ID: Steven Stevens, male    DOB: April 24, 1945, 75 y.o.   MRN: [id number]  Chief Complaint  Patient presents with   Follow-up    3 month follow up/sinus issues     HPI Patient is in today for lab follow-up for T2DM and neutropenia.  Past Medical History:  Diagnosis Date   Anxiety    Chronic lower back pain    Coronary artery disease    DES to RCA 2005, DES to RCA 2017, DES x2 to CTO LAD 2017   Depression    Hyperlipidemia    Hypertension    Myocardial infarct ([place]) 01/27/2004   Type 2 diabetes mellitus ([place])    Type II diabetes mellitus ([place])     Past Surgical History:  Procedure Laterality Date   BACK SURGERY     CARDIAC CATHETERIZATION N/A 03/30/2015   Procedure: Left Heart Cath and Coronary Angiography;  Surgeon: Jettie Booze, MD;  Location: [facility];  Service: Cardiovascular;  Laterality: N/A;   CARDIAC CATHETERIZATION  03/30/2015   Procedure: Coronary Stent Intervention;  Surgeon: Jettie Booze, MD;  Location: [facility];  Service: Cardiovascular;;   CARDIAC CATHETERIZATION N/A 04/14/2015   Procedure: Coronary/Bypass Graft CTO Intervention;  Surgeon: Jettie Booze, MD;  Location: [facility];  Service: Cardiovascular;  Laterality: N/A;   CARDIAC CATHETERIZATION  04/14/2015   Procedure: Left Heart Cath and Coronary Angiography;  Surgeon: Jettie Booze, MD;  Location: [facility];  Service: Cardiovascular;;   CATARACT EXTRACTION W/PHACO Right 12/03/2012   Procedure: CATARACT EXTRACTION RIGHT EYE (WITH PHACO) AND INTRAOCULAR LENS PLACEMENT  CDE=2.41;  Surgeon: Elta Guadeloupe [name], MD;  Location: [facility];  Service: Ophthalmology;  Laterality: Right;   CATARACT EXTRACTION W/PHACO Left 12/17/2012   Procedure: CATARACT EXTRACTION PHACO AND INTRAOCULAR LENS PLACEMENT (IOC);  Surgeon: Elta Guadeloupe [name], MD;  Location: [facility];  Service: Ophthalmology;  Laterality: Left;  CDE:7.82   COLON  SURGERY N/A    Phreesia 01/11/2020   COLONOSCOPY  2011   Dr. Gala Romney: diverticulosis, tubular adenomas   COLONOSCOPY WITH PROPOFOL N/A 07/24/2016   Rourk: 3 tubular adenomas removed.  Diverticulosis.  3-year surveillance colonoscopy recommended.   COLONOSCOPY WITH PROPOFOL N/A 12/15/2019   Procedure: COLONOSCOPY WITH PROPOFOL;  Surgeon: Daneil Dolin, MD;  [facility];  Service: Endoscopy;  Laterality: N/A;  8:30am   CORONARY ANGIOPLASTY WITH STENT PLACEMENT  2005   CORONARY STENT INTERVENTION N/A 04/12/2020   Procedure: CORONARY STENT INTERVENTION;  Surgeon: Sherren Mocha, MD;  Location: [facility];  Service: Cardiovascular;  Laterality: N/A;   CORONARY STENT PLACEMENT  03/30/2015   RCA    DES   EYE SURGERY N/A    Phreesia 01/11/2020   HERNIA REPAIR     INTRAVASCULAR IMAGING/OCT N/A 04/12/2020   Procedure: INTRAVASCULAR IMAGING/OCT;  Surgeon: Sherren Mocha, MD;  Location: [facility];  Service: Cardiovascular;  Laterality: N/A;   KNEE ARTHROSCOPY Left 2012   LEFT HEART CATH AND CORONARY ANGIOGRAPHY N/A 04/12/2020   Procedure: LEFT HEART CATH AND CORONARY ANGIOGRAPHY;  Surgeon: Sherren Mocha, MD;  Location: [facility];  Service: Cardiovascular;  Laterality: N/A;   [place]   "herniated disc"   POLYPECTOMY  07/24/2016   Procedure: POLYPECTOMY;  Surgeon: Daneil Dolin, MD;  [facility];  Service: Endoscopy;;  ascending colon polyp, splenic flexure  polyps times 2   POLYPECTOMY  12/15/2019   Procedure: POLYPECTOMY;  Surgeon: Daneil Dolin, MD;  [facility];  Service: Endoscopy;;   UMBILICAL HERNIA REPAIR  [id number]   YAG LASER APPLICATION Right 31/28/1188   Procedure: YAG LASER APPLICATION;  Surgeon: Elta Guadeloupe [name], MD;  Location: [facility];  Service: Ophthalmology;  Laterality: Right;    Family History  Problem Relation Age of Onset   Colon cancer Mother        deceased age 68, diagnosed with CRC in 31s.     Social History   Socioeconomic History   Marital status: Widowed    Spouse name: Not on file   Number of children: Not on file   Years of education: Not on file   Highest education level: Not on file  Occupational History   Not on file  Tobacco Use   Smoking status: Former    Packs/day: 0.10    Years: 10.00    Pack years: 1.00    Types: Cigarettes    Quit date: 01/31/2004    Years since quitting: 16.9   Smokeless tobacco: Never  Vaping Use   Vaping Use: Never used  Substance and Sexual Activity   Alcohol use: No    Alcohol/week: 0.0 standard drinks   Drug use: No   Sexual activity: Never  Other Topics Concern   Not on file  Social History Narrative   Not on file   Social Determinants of Health   Financial Resource Strain: Low Risk    Difficulty of Paying Living Expenses: Not hard at all  Food Insecurity: No Food Insecurity   Worried About Charity fundraiser in the Last Year: Never true   [place] in the Last Year: Never true  Transportation Needs: No Transportation Needs   Lack of Transportation (Medical): No   Lack of Transportation (Non-Medical): No  Physical Activity: Insufficiently Active   Days of Exercise per Week: 5 days   Minutes of Exercise per Session: 20 min  Stress: Not on file  Social Connections: Moderately Isolated   Frequency of Communication with Friends and Family: More than three times a week   Frequency of Social Gatherings with Friends and Family: More than three times a week   Attends Religious Services: 1 to 4 times per year   Active Member of [employer] or Organizations: No   Attends Archivist Meetings: Never   Marital Status: Widowed  Intimate Partner Violence: Not on file    Outpatient Medications Prior to Visit  Medication Sig Dispense Refill   acetaminophen (TYLENOL) 500 MG tablet Take 325 mg by mouth daily.     ALPRAZolam (XANAX) 0.5 MG tablet Take 1 tablet (0.5 mg total) by mouth in the morning, at noon, and  at bedtime. 90 tablet 2   aspirin EC 81 MG tablet Take 81 mg by mouth daily.     atorvastatin (LIPITOR) 80 MG tablet TAKE 1 TABLET ONCE DAILY. 30 tablet 6   clopidogrel (PLAVIX) 75 MG tablet Take 1 tablet (75 mg total) by mouth daily. 90 tablet 3   escitalopram (LEXAPRO) 10 MG tablet Take 1 tablet (10 mg total) by mouth daily in the afternoon. 30 tablet 1   ezetimibe (ZETIA) 10 MG tablet Take 1 tablet (10 mg total) by mouth daily. 90 tablet 3   glipiZIDE-metformin (METAGLIP) 2.5-500 MG tablet TAKE 2 TABLETS BY MOUTH IN THE MORNING AND 1 TABLET AT NIGHT 270 tablet 1  HYDROcodone-acetaminophen (NORCO) 10-325 MG tablet Take 1 tablet by mouth every 6 (six) hours as needed for severe pain. 40 tablet 0   hydrocortisone (ANUSOL-HC) 2.5 % rectal cream Place 1 application rectally 4 (four) times daily as needed (hemorrhoids/discomfort).      LINZESS 145 MCG CAPS capsule Take 145 mcg by mouth daily as needed (constipation.).      losartan (COZAAR) 25 MG tablet Take 1 tablet (25 mg total) by mouth daily. 90 tablet 0   metoprolol tartrate (LOPRESSOR) 25 MG tablet Take 0.5 tablets (12.5 mg total) by mouth 2 (two) times daily. 90 tablet 3   Misc Natural Products (NEURIVA PO) Take 1 tablet by mouth daily in the afternoon.     nitroGLYCERIN (NITROSTAT) 0.4 MG SL tablet Place 1 tablet (0.4 mg total) under the tongue every 5 (five) minutes x 3 doses as needed for chest pain. 25 tablet 3   terazosin (HYTRIN) 5 MG capsule Take 1 capsule (5 mg total) by mouth daily in the afternoon. 90 capsule 1   traZODone (DESYREL) 50 MG tablet Take 1 tablet (50 mg total) by mouth at bedtime. 90 tablet 1   Study - SOS-AMI - selatogrel 16 mg/0.5 mL or placebo SQ injection (PI-Christopher) Inject 0.5 mLs (16 mg total) into the skin once for 1 dose. Inject 0.5 mL subcutaneously in the abdomen or thigh if you experience  symptoms of a heart attack. Call 911 immediately  and seek emergency medical support. (Patient not taking: Reported on  09/22/2020) 0.5 mL 0   No facility-administered medications prior to visit.    Allergies  Allergen Reactions   Synjardy Xr [Empagliflozin-Metformin Hcl Er] Other (See Comments)    Itching/burning urination    Review of Systems  Constitutional:  Negative for chills and fever.  HENT:  Positive for congestion, rhinorrhea and sinus pressure.   Respiratory: Negative.    Cardiovascular: Negative.       Objective:    Physical Exam Constitutional:      Appearance: Normal appearance. He is obese.  Cardiovascular:     Rate and Rhythm: Normal rate and regular rhythm.     Pulses: Normal pulses.     Heart sounds: Normal heart sounds.  Pulmonary:     Effort: Pulmonary effort is normal.     Breath sounds: Normal breath sounds.  Neurological:     Mental Status: He is alert.  Psychiatric:        Mood and Affect: Mood normal.        Behavior: Behavior normal.        Thought Content: Thought content normal.        Judgment: Judgment normal.    BP (!) 152/92    Pulse 75    Ht _0  (1.727 m)    Wt (!) 305 lb 1.9 oz (138.4 kg)    SpO2 (!) 89%    BMI 46.39 kg/m  Wt Readings from Last 3 Encounters:  01/20/21 (!) 305 lb 1.9 oz (138.4 kg)  11/05/20 297 lb 1.3 oz (134.8 kg)  10/21/20 296 lb (134.3 kg)    Health Maintenance Due  Topic Date Due   FOOT EXAM  Never done   OPHTHALMOLOGY EXAM  08/13/2020   COVID-19 Vaccine (4 - Booster for Moderna series) 09/02/2020    There are no preventive care reminders to display for this patient.   Lab Results  Component Value Date   TSH 1.966 04/12/2020   Lab Results  Component Value Date   WBC  3.2 (L) 10/19/2020   HGB 12.5 (L) 10/19/2020   HCT 37.6 10/19/2020   MCV 92 10/19/2020   PLT 191 10/19/2020   Lab Results  Component Value Date   NA 142 10/19/2020   K 4.5 10/19/2020   CO2 25 10/19/2020   GLUCOSE 104 (H) 10/19/2020   BUN 10 10/19/2020   CREATININE 1.14 10/19/2020   BILITOT 0.8 10/19/2020   ALKPHOS 38 (L) 10/19/2020   AST 23  10/19/2020   ALT 21 10/19/2020   PROT 6.7 10/19/2020   ALBUMIN 4.3 10/19/2020   CALCIUM 8.9 10/19/2020   ANIONGAP 8 04/13/2020   EGFR 67 10/19/2020   Lab Results  Component Value Date   CHOL 105 10/19/2020   Lab Results  Component Value Date   HDL 40 10/19/2020   Lab Results  Component Value Date   LDLCALC 45 10/19/2020   Lab Results  Component Value Date   TRIG 107 10/19/2020   Lab Results  Component Value Date   CHOLHDL 3.7 04/12/2020   Lab Results  Component Value Date   HGBA1C 7.0 (H) 10/19/2020       Assessment & Plan:   Problem List Items Addressed This Visit       Cardiovascular and Mediastinum   Essential hypertension    Manual BP        Endocrine   Type 2 diabetes mellitus treated without insulin ([place]) - Primary    -check A1c with labs      Relevant Orders   CBC with Differential/Platelet   CMP14+EGFR   Lipid Panel With LDL/HDL Ratio   Hemoglobin A1c     Other   Chronic back pain    -UDS ordered today      Relevant Orders   Drug Screen 12+Alcohol+CRT, Ur   Neutropenia ([place])    -he had neutropenia previously, and was supposed to get his labs redrawn at his appt on 9/15, but he apparently didn't get them drawn despite discussing it during the appointment -repeat CBC and pathologist smear      Relevant Orders   CBC with Differential/Platelet   Pathologist smear review     No orders of the defined types were placed in this encounter.    [name], NP

## 2021-01-20 NOTE — Assessment & Plan Note (Signed)
-  check A1c with labs

## 2021-01-20 NOTE — Assessment & Plan Note (Signed)
-  UDS ordered today

## 2021-01-20 NOTE — Addendum Note (Signed)
Addended by: Demetrius Revel on: 01/20/2021 01:29 PM   Modules accepted: Orders

## 2021-01-21 NOTE — Progress Notes (Signed)
GOOD NEWS  so far. Labs are still coming in, but neutropenia has resolved. We will contact you when the rest of the labs come in.

## 2021-01-22 LAB — HEMOGLOBIN A1C
Est. average glucose Bld gHb Est-mCnc: 151 mg/dL
Hgb A1c MFr Bld: 6.9 % — ABNORMAL HIGH (ref 4.8–5.6)

## 2021-01-22 LAB — CBC WITH DIFFERENTIAL/PLATELET
Basophils Absolute: 0 10*3/uL (ref 0.0–0.2)
Basos: 0 %
EOS (ABSOLUTE): 0.3 10*3/uL (ref 0.0–0.4)
Eos: 3 %
Hematocrit: 35.8 % — ABNORMAL LOW (ref 37.5–51.0)
Hemoglobin: 12.1 g/dL — ABNORMAL LOW (ref 13.0–17.7)
Immature Grans (Abs): 0 10*3/uL (ref 0.0–0.1)
Immature Granulocytes: 0 %
Lymphocytes Absolute: 2.9 10*3/uL (ref 0.7–3.1)
Lymphs: 31 %
MCH: 30.9 pg (ref 26.6–33.0)
MCHC: 33.8 g/dL (ref 31.5–35.7)
MCV: 91 fL (ref 79–97)
Monocytes Absolute: 0.7 10*3/uL (ref 0.1–0.9)
Monocytes: 8 %
Neutrophils Absolute: 5.4 10*3/uL (ref 1.4–7.0)
Neutrophils: 58 %
Platelets: 249 10*3/uL (ref 150–450)
RBC: 3.92 x10E6/uL — ABNORMAL LOW (ref 4.14–5.80)
RDW: 13.4 % (ref 11.6–15.4)
WBC: 9.3 10*3/uL (ref 3.4–10.8)

## 2021-01-22 LAB — CMP14+EGFR
ALT: 25 IU/L (ref 0–44)
AST: 23 IU/L (ref 0–40)
Albumin/Globulin Ratio: 2 (ref 1.2–2.2)
Albumin: 4.1 g/dL (ref 3.7–4.7)
Alkaline Phosphatase: 40 IU/L — ABNORMAL LOW (ref 44–121)
BUN/Creatinine Ratio: 8 — ABNORMAL LOW (ref 10–24)
BUN: 9 mg/dL (ref 8–27)
Bilirubin Total: 0.7 mg/dL (ref 0.0–1.2)
CO2: 24 mmol/L (ref 20–29)
Calcium: 8.6 mg/dL (ref 8.6–10.2)
Chloride: 104 mmol/L (ref 96–106)
Creatinine, Ser: 1.06 mg/dL (ref 0.76–1.27)
Globulin, Total: 2.1 g/dL (ref 1.5–4.5)
Glucose: 90 mg/dL (ref 70–99)
Potassium: 4.7 mmol/L (ref 3.5–5.2)
Sodium: 143 mmol/L (ref 134–144)
Total Protein: 6.2 g/dL (ref 6.0–8.5)
eGFR: 73 mL/min/{1.73_m2} (ref >59)

## 2021-01-22 LAB — LIPID PANEL WITH LDL/HDL RATIO
Cholesterol, Total: 104 mg/dL (ref 100–199)
HDL: 44 mg/dL (ref >39)
LDL Chol Calc (NIH): 45 mg/dL (ref 0–99)
LDL/HDL Ratio: 1 ratio (ref 0.0–3.6)
Triglycerides: 71 mg/dL (ref 0–149)
VLDL Cholesterol Cal: 15 mg/dL (ref 5–40)

## 2021-01-24 ENCOUNTER — Telehealth: Payer: Self-pay | Admitting: Nurse Practitioner

## 2021-01-24 NOTE — Progress Notes (Signed)
A1c and labs look great. There are still a few pending, but we will notify you if there is anything that needs to be addressed.

## 2021-01-24 NOTE — Telephone Encounter (Signed)
Returned call patient aware of lab results

## 2021-01-24 NOTE — Telephone Encounter (Signed)
Pt is returning call.  

## 2021-01-25 LAB — PATHOLOGIST SMEAR REVIEW
Basophils Absolute: 0 10*3/uL (ref 0.0–0.2)
Basos: 0 %
EOS (ABSOLUTE): 0.2 10*3/uL (ref 0.0–0.4)
Eos: 3 %
Hematocrit: 35.8 % — ABNORMAL LOW (ref 37.5–51.0)
Hemoglobin: 12.1 g/dL — ABNORMAL LOW (ref 13.0–17.7)
Immature Grans (Abs): 0 10*3/uL (ref 0.0–0.1)
Immature Granulocytes: 0 %
Lymphocytes Absolute: 2.7 10*3/uL (ref 0.7–3.1)
Lymphs: 29 %
MCH: 30.7 pg (ref 26.6–33.0)
MCHC: 33.8 g/dL (ref 31.5–35.7)
MCV: 91 fL (ref 79–97)
Monocytes Absolute: 0.7 10*3/uL (ref 0.1–0.9)
Monocytes: 8 %
Neutrophils Absolute: 5.4 10*3/uL (ref 1.4–7.0)
Neutrophils: 60 %
Platelets: 243 10*3/uL (ref 150–450)
RBC: 3.94 x10E6/uL — ABNORMAL LOW (ref 4.14–5.80)
RDW: 13.3 % (ref 11.6–15.4)
WBC: 9.1 10*3/uL (ref 3.4–10.8)

## 2021-01-26 LAB — DRUG SCREEN 12+ALCOHOL+CRT, UR
Amphetamines, Urine: NEGATIVE ng/mL
BENZODIAZ UR QL: POSITIVE ng/mL — AB
Barbiturate: NEGATIVE ng/mL
Cannabinoids: NEGATIVE ng/mL
Cocaine (Metabolite): NEGATIVE ng/mL
Creatinine, Urine: 248.3 mg/dL (ref 20.0–300.0)
Ethanol, Urine: NEGATIVE %
Meperidine: NEGATIVE ng/mL
Methadone: NEGATIVE ng/mL
OPIATE SCREEN URINE: POSITIVE ng/mL — AB
Oxycodone/Oxymorphone, Urine: NEGATIVE ng/mL
Phencyclidine: NEGATIVE ng/mL
Propoxyphene: NEGATIVE ng/mL
Tramadol: NEGATIVE ng/mL

## 2021-02-02 ENCOUNTER — Other Ambulatory Visit: Payer: Self-pay | Admitting: Nurse Practitioner

## 2021-02-02 ENCOUNTER — Other Ambulatory Visit: Payer: Self-pay | Admitting: Family Medicine

## 2021-02-02 DIAGNOSIS — F419 Anxiety disorder, unspecified: Secondary | ICD-10-CM

## 2021-02-02 DIAGNOSIS — I10 Essential (primary) hypertension: Secondary | ICD-10-CM

## 2021-02-03 ENCOUNTER — Other Ambulatory Visit: Payer: Self-pay

## 2021-02-03 ENCOUNTER — Telehealth: Payer: Self-pay | Admitting: Nurse Practitioner

## 2021-02-03 ENCOUNTER — Other Ambulatory Visit: Payer: Self-pay | Admitting: Internal Medicine

## 2021-02-03 ENCOUNTER — Other Ambulatory Visit: Payer: Self-pay | Admitting: Nurse Practitioner

## 2021-02-03 DIAGNOSIS — I10 Essential (primary) hypertension: Secondary | ICD-10-CM

## 2021-02-03 DIAGNOSIS — F419 Anxiety disorder, unspecified: Secondary | ICD-10-CM

## 2021-02-03 MED ORDER — ALPRAZOLAM 0.5 MG PO TABS
0.5000 mg | ORAL_TABLET | Freq: Three times a day (TID) | ORAL | 2 refills | Status: DC
Start: 2021-02-03 — End: 2021-04-04

## 2021-02-03 MED ORDER — LOSARTAN POTASSIUM 25 MG PO TABS: 25.0000 mg | ORAL_TABLET | Freq: Every day | ORAL | 0 refills | Status: DC

## 2021-02-03 NOTE — Telephone Encounter (Signed)
Pt needs refills on   ALPRAZolam (XANAX) 0.5 MG tablet   losartan (COZAAR) 25 MG tablet

## 2021-02-05 ENCOUNTER — Other Ambulatory Visit: Payer: Self-pay | Admitting: Nurse Practitioner

## 2021-02-05 DIAGNOSIS — I10 Essential (primary) hypertension: Secondary | ICD-10-CM

## 2021-02-05 MED ORDER — TERAZOSIN HCL 5 MG PO CAPS: ORAL_CAPSULE | ORAL | 3 refills | Status: AC

## 2021-02-10 ENCOUNTER — Encounter: Payer: Self-pay | Admitting: *Deleted

## 2021-02-10 DIAGNOSIS — Z006 Encounter for examination for normal comparison and control in clinical research program: Secondary | ICD-10-CM

## 2021-02-10 NOTE — Research (Signed)
[id number], [id number]     FOLLOW-UP PHONE CALLS  SUBJECT ID:  [id number] [name], RN  Trainer's signature: on Delegation of Authority Log Date of the Follow up call:  10-Feb-2021  Visit Number: 6 Start time of the Follow up call:   1100   End time of call: 1110  He is doing well overall and reports no AEs, SAEs, Hospitalizations or Urgent Care visits. Patient reports that he is taking all his medications as directed including his Lipitor and Plavix. Mr. Souder answered all the Study questions appropriately. He has my contact information should he have any questions or concerns.   - CONTACT  Q1;  Was the follow up phone call done with the subject?  [x]   YES  []   NO           If NO, check the following:      Q2:    Was the follow up phone call done with a family member               Or caregiver?    []   YES   [x]   NO          Make note about reasons ___________________________________    - MEDICAL CONDITION      Q3:  Did any of the following occur?   []   Death   NONE    []   Hospitalization (any cause)         []   Use of autoinjector  Make note about the type of event, and when it occurred. In case of hospitalization: the Location of the hospital and/or the treating physician's contact details  ____________ ____________________________________________________________________ ____________________________________________________________________  Note: remember to report any SAE/AE which has occurred within 30 days after any  injection of the study drug on relevant eCRF forms.   Q4:  Did the subject develop any condition which is an          exclusion criterion?  []   YES  [x]   NO   Take note about the occurrence of any exclusion criterion after randomization: _________________________________________________________________ __________________________________________________________________  Q5: Was there any change in subject's  antithrombotic therapy?   []   YES  [x]   NO     Take not about any change of antithrombotic treatment: _______________________ ____________________________________________________________________ ____________________________________________________________________      Lezlie Octave OF THE STUDY-SPECFIC TRAINING  Q6: Did the subject correctly reply to the following questions?       A.  What are common heart attack symptoms?      [x]   YES   []   NO      B.  What has to be done in case any of those symptoms occurs? [x]   YES  []   NO      C.  What are the main steps to perform a self-injection?  [x]   YES  []   NO             If NO, then report which step/s was/were missing:        []   Choose injection site (abdomen or thigh)       []   Twist cap off       []   Pinch skin and place the study autoinjector       []   Firmly push down and hold for 3 seconds       D. What has to be done immediately after an injection?  [x]   YES   []   NO  If NO, then report which step/s was/were missing:       []   Call  for emergency medical help       []   Show the autoinjector to the emergency medical responder       E.  Does the subject recall where s/he keeps/ stores the autoinjectors? [x]  YES  []  NO          Note the place of storage and any corrective explanation if needed below __________________________________________________________________ __________________________________________________________________  - TRAINING REFRESHER   Q7:  Is a training refresher needed?      []  YES  [x]  NO        If YES, indicate items that have to be refreshed. More than one may apply:               []   Heart attack symptoms             []   Actions to be taken following heart attack symptoms             []   Steps to perform the self-injection and follow-up actions to be taken   []   Other, Specify  _________________________________________     Form Based on [facility] SOS-AMI_CRF_Version 5.0_[id number] [id number],  62,            and eCRF  Version 5.0-  03-Dec-2019   Current Outpatient Medications:    acetaminophen (TYLENOL) 500 MG tablet, Take 325 mg by mouth daily., Disp: , Rfl:    ALPRAZolam (XANAX) 0.5 MG tablet, Take 1 tablet (0.5 mg total) by mouth in the morning, at noon, and at bedtime., Disp: 90 tablet, Rfl: 2   aspirin EC 81 MG tablet, Take 81 mg by mouth daily., Disp: , Rfl:    atorvastatin (LIPITOR) 80 MG tablet, TAKE 1 TABLET ONCE DAILY., Disp: 30 tablet, Rfl: 6   azithromycin (ZITHROMAX) 250 MG tablet, Please dispense as a z-pack, Disp: 6 tablet, Rfl: 0   clopidogrel (PLAVIX) 75 MG tablet, Take 1 tablet (75 mg total) by mouth daily., Disp: 90 tablet, Rfl: 3   escitalopram (LEXAPRO) 10 MG tablet, TAKE 1 TABLET DAILY IN THE AFTERNOON., Disp: 30 tablet, Rfl: 0   ezetimibe (ZETIA) 10 MG tablet, Take 1 tablet (10 mg total) by mouth daily., Disp: 90 tablet, Rfl: 3   glipiZIDE-metformin (METAGLIP) 2.5-500 MG tablet, TAKE 2 TABLETS BY MOUTH IN THE MORNING AND 1 TABLET AT NIGHT, Disp: 270 tablet, Rfl: 1   HYDROcodone-acetaminophen (NORCO) 10-325 MG tablet, Take 1 tablet by mouth every 6 (six) hours as needed for severe pain., Disp: 40 tablet, Rfl: 0   hydrocortisone (ANUSOL-HC) 2.5 % rectal cream, Place 1 application rectally 4 (four) times daily as needed (hemorrhoids/discomfort). , Disp: , Rfl:    LINZESS 145 MCG CAPS capsule, Take 145 mcg by mouth daily as needed (constipation.). , Disp: , Rfl:    losartan (COZAAR) 25 MG tablet, Take 1 tablet (25 mg total) by mouth daily., Disp: 90 tablet, Rfl: 0   metoprolol tartrate (LOPRESSOR) 25 MG tablet, Take 0.5 tablets (12.5 mg total) by mouth 2 (two) times daily., Disp: 90 tablet, Rfl: 3   Misc Natural Products (NEURIVA PO), Take 1 tablet by mouth daily in the afternoon., Disp: , Rfl:    nitroGLYCERIN (NITROSTAT) 0.4 MG SL tablet, Place 1 tablet (0.4 mg total) under the tongue every 5 (five) minutes x 3 doses as needed for chest pain., Disp: 25 tablet, Rfl:  3   Study - SOS-AMI - selatogrel  16 mg/0.5 mL or placebo SQ injection (PI-Christopher), Inject 0.5 mLs (16 mg total) into the skin once for 1 dose. Inject 0.5 mL subcutaneously in the abdomen or thigh if you experience  symptoms of a heart attack. Call 911 immediately  and seek emergency medical support. (Patient not taking: Reported on 09/22/2020), Disp: 0.5 mL, Rfl: 0   terazosin (HYTRIN) 5 MG capsule, TAKE 1 CAPSULE IN THE AFTERNOON DAILY., Disp: 30 capsule, Rfl: 0   terazosin (HYTRIN) 5 MG capsule, TAKE 1 CAPSULE BY MOUTH DAILY IN THE AFTERNOON., Disp: 30 capsule, Rfl: 3   traZODone (DESYREL) 50 MG tablet, Take 1 tablet (50 mg total) by mouth at bedtime., Disp: 90 tablet, Rfl: 1

## 2021-02-17 ENCOUNTER — Telehealth: Payer: Self-pay

## 2021-02-17 DIAGNOSIS — M545 Low back pain, unspecified: Secondary | ICD-10-CM

## 2021-02-17 DIAGNOSIS — G8929 Other chronic pain: Secondary | ICD-10-CM

## 2021-02-17 NOTE — Telephone Encounter (Signed)
Patient called need med refills  HYDROcodone-acetaminophen ([place]) 10-325 MG tablet   Pharmacy:  [facility]

## 2021-02-18 ENCOUNTER — Other Ambulatory Visit: Payer: Self-pay | Admitting: Nurse Practitioner

## 2021-02-18 ENCOUNTER — Other Ambulatory Visit: Payer: Self-pay | Admitting: Internal Medicine

## 2021-02-18 ENCOUNTER — Telehealth: Payer: Self-pay

## 2021-02-18 DIAGNOSIS — G8929 Other chronic pain: Secondary | ICD-10-CM

## 2021-02-18 DIAGNOSIS — M545 Low back pain, unspecified: Secondary | ICD-10-CM

## 2021-02-18 DIAGNOSIS — G894 Chronic pain syndrome: Secondary | ICD-10-CM

## 2021-02-18 MED ORDER — HYDROCODONE-ACETAMINOPHEN 10-325 MG PO TABS: 1.0000 | ORAL_TABLET | Freq: Four times a day (QID) | ORAL | 0 refills | Status: AC | PRN

## 2021-02-18 MED ORDER — LINZESS 145 MCG PO CAPS: 145.0000 ug | ORAL_CAPSULE | Freq: Every day | ORAL | 1 refills | Status: DC | PRN

## 2021-02-18 NOTE — Telephone Encounter (Signed)
error 

## 2021-02-18 NOTE — Telephone Encounter (Signed)
Called and notified patient of providers recommendations, he verbalized understanding. [place] with referral. Referral has been placed.

## 2021-02-18 NOTE — Telephone Encounter (Signed)
Patient returning nurse call. Call back # [phone]

## 2021-02-18 NOTE — Telephone Encounter (Signed)
Patient returning call.

## 2021-02-18 NOTE — Telephone Encounter (Signed)
LM for pt to returncall

## 2021-02-18 NOTE — Addendum Note (Signed)
Addended by: Glee Arvin D on: 02/18/2021 10:55 AM   Modules accepted: Orders

## 2021-03-01 ENCOUNTER — Ambulatory Visit: Payer: Medicare Other

## 2021-03-01 ENCOUNTER — Telehealth: Payer: Self-pay | Admitting: Nurse Practitioner

## 2021-03-01 ENCOUNTER — Other Ambulatory Visit: Payer: Self-pay

## 2021-03-01 NOTE — Telephone Encounter (Signed)
Please send in Hydrocodone , he did not receive his full script last time

## 2021-03-02 ENCOUNTER — Other Ambulatory Visit: Payer: Self-pay

## 2021-03-02 DIAGNOSIS — G8929 Other chronic pain: Secondary | ICD-10-CM

## 2021-03-02 NOTE — Telephone Encounter (Signed)
Patient agreed to go to pain management in [place].  I told him Kristin Bruins would not continue the refill pain meds.  He is asking for a pain management referral.  Can you enter this for [facility] in [place]?  Patient Agreed.

## 2021-03-02 NOTE — Telephone Encounter (Signed)
Pt states frustration about having to be with a FNP and not Dr Posey Pronto.  Pt states that his insurance card says Dr Posey Pronto why can he not see Dr Posey Pronto? He states that he needs his Hydrocodone every 6 hours and does not need to be running from dr to dr to get the medication that he has been on for years.  If Dr Posey Pronto filled his meds 10 days ago why can he not continue filling all of his medications? I explained I would let my office manager know and if there was anything we could do someone would call back.  Pt did agree to see ortho for the time being.

## 2021-03-02 NOTE — Telephone Encounter (Signed)
Left VM on Cell phone for pt to call the office back

## 2021-03-03 ENCOUNTER — Other Ambulatory Visit: Payer: Self-pay

## 2021-03-03 DIAGNOSIS — G8929 Other chronic pain: Secondary | ICD-10-CM

## 2021-03-03 DIAGNOSIS — M545 Low back pain, unspecified: Secondary | ICD-10-CM

## 2021-03-03 NOTE — Telephone Encounter (Signed)
Referral has been placed. 

## 2021-03-05 ENCOUNTER — Other Ambulatory Visit: Payer: Self-pay | Admitting: Family Medicine

## 2021-03-05 DIAGNOSIS — F419 Anxiety disorder, unspecified: Secondary | ICD-10-CM

## 2021-03-08 ENCOUNTER — Other Ambulatory Visit: Payer: Self-pay

## 2021-03-17 ENCOUNTER — Other Ambulatory Visit: Payer: Self-pay

## 2021-03-17 DIAGNOSIS — G8929 Other chronic pain: Secondary | ICD-10-CM

## 2021-03-17 DIAGNOSIS — M545 Low back pain, unspecified: Secondary | ICD-10-CM

## 2021-03-23 ENCOUNTER — Other Ambulatory Visit: Payer: Self-pay | Admitting: Internal Medicine

## 2021-03-23 DIAGNOSIS — I10 Essential (primary) hypertension: Secondary | ICD-10-CM | POA: Diagnosis not present

## 2021-03-23 DIAGNOSIS — K649 Unspecified hemorrhoids: Secondary | ICD-10-CM | POA: Diagnosis not present

## 2021-03-23 DIAGNOSIS — G8929 Other chronic pain: Secondary | ICD-10-CM | POA: Diagnosis not present

## 2021-03-23 DIAGNOSIS — E785 Hyperlipidemia, unspecified: Secondary | ICD-10-CM | POA: Diagnosis not present

## 2021-03-23 DIAGNOSIS — E119 Type 2 diabetes mellitus without complications: Secondary | ICD-10-CM | POA: Diagnosis not present

## 2021-03-23 DIAGNOSIS — I2510 Atherosclerotic heart disease of native coronary artery without angina pectoris: Secondary | ICD-10-CM | POA: Diagnosis not present

## 2021-03-23 DIAGNOSIS — K5909 Other constipation: Secondary | ICD-10-CM | POA: Diagnosis not present

## 2021-03-23 DIAGNOSIS — Z955 Presence of coronary angioplasty implant and graft: Secondary | ICD-10-CM | POA: Diagnosis not present

## 2021-03-23 DIAGNOSIS — G4700 Insomnia, unspecified: Secondary | ICD-10-CM | POA: Diagnosis not present

## 2021-03-29 ENCOUNTER — Telehealth: Payer: Medicare Other

## 2021-04-01 ENCOUNTER — Other Ambulatory Visit: Payer: Self-pay | Admitting: Nurse Practitioner

## 2021-04-01 DIAGNOSIS — G4700 Insomnia, unspecified: Secondary | ICD-10-CM

## 2021-04-03 ENCOUNTER — Other Ambulatory Visit: Payer: Self-pay | Admitting: Internal Medicine

## 2021-04-03 DIAGNOSIS — F419 Anxiety disorder, unspecified: Secondary | ICD-10-CM

## 2021-04-21 ENCOUNTER — Ambulatory Visit: Payer: Medicare Other | Admitting: Nurse Practitioner

## 2021-04-21 DIAGNOSIS — E119 Type 2 diabetes mellitus without complications: Secondary | ICD-10-CM | POA: Diagnosis not present

## 2021-04-21 DIAGNOSIS — I10 Essential (primary) hypertension: Secondary | ICD-10-CM | POA: Diagnosis not present

## 2021-04-27 DIAGNOSIS — K5909 Other constipation: Secondary | ICD-10-CM | POA: Diagnosis not present

## 2021-04-27 DIAGNOSIS — E785 Hyperlipidemia, unspecified: Secondary | ICD-10-CM | POA: Diagnosis not present

## 2021-04-27 DIAGNOSIS — G4700 Insomnia, unspecified: Secondary | ICD-10-CM | POA: Diagnosis not present

## 2021-04-27 DIAGNOSIS — I10 Essential (primary) hypertension: Secondary | ICD-10-CM | POA: Diagnosis not present

## 2021-04-27 DIAGNOSIS — G8929 Other chronic pain: Secondary | ICD-10-CM | POA: Diagnosis not present

## 2021-04-27 DIAGNOSIS — K649 Unspecified hemorrhoids: Secondary | ICD-10-CM | POA: Diagnosis not present

## 2021-04-27 DIAGNOSIS — E1165 Type 2 diabetes mellitus with hyperglycemia: Secondary | ICD-10-CM | POA: Diagnosis not present

## 2021-04-27 DIAGNOSIS — I2510 Atherosclerotic heart disease of native coronary artery without angina pectoris: Secondary | ICD-10-CM | POA: Diagnosis not present

## 2021-04-27 DIAGNOSIS — Z955 Presence of coronary angioplasty implant and graft: Secondary | ICD-10-CM | POA: Diagnosis not present

## 2021-05-04 DIAGNOSIS — I10 Essential (primary) hypertension: Secondary | ICD-10-CM | POA: Diagnosis not present

## 2021-05-11 DIAGNOSIS — I10 Essential (primary) hypertension: Secondary | ICD-10-CM | POA: Diagnosis not present

## 2021-05-25 ENCOUNTER — Other Ambulatory Visit: Payer: Self-pay | Admitting: Internal Medicine

## 2021-05-25 ENCOUNTER — Other Ambulatory Visit: Payer: Self-pay | Admitting: Nurse Practitioner

## 2021-05-25 DIAGNOSIS — G4700 Insomnia, unspecified: Secondary | ICD-10-CM

## 2021-05-25 DIAGNOSIS — I10 Essential (primary) hypertension: Secondary | ICD-10-CM | POA: Diagnosis not present

## 2021-05-25 DIAGNOSIS — F419 Anxiety disorder, unspecified: Secondary | ICD-10-CM

## 2021-05-30 ENCOUNTER — Other Ambulatory Visit: Payer: Self-pay | Admitting: Nurse Practitioner

## 2021-05-30 DIAGNOSIS — I10 Essential (primary) hypertension: Secondary | ICD-10-CM

## 2021-06-07 ENCOUNTER — Encounter: Payer: Self-pay | Admitting: *Deleted

## 2021-06-07 DIAGNOSIS — Z006 Encounter for examination for normal comparison and control in clinical research program: Secondary | ICD-10-CM

## 2021-06-07 IMAGING — DX DG CHEST 1V PORT
1 series · 1 of 1 positions shown · non-contrast
Comparison: March 25, 2015.

CLINICAL DATA: Pending COVID test.  Shortness of breath.

EXAM:
PORTABLE CHEST 1 VIEW

[chest ap]
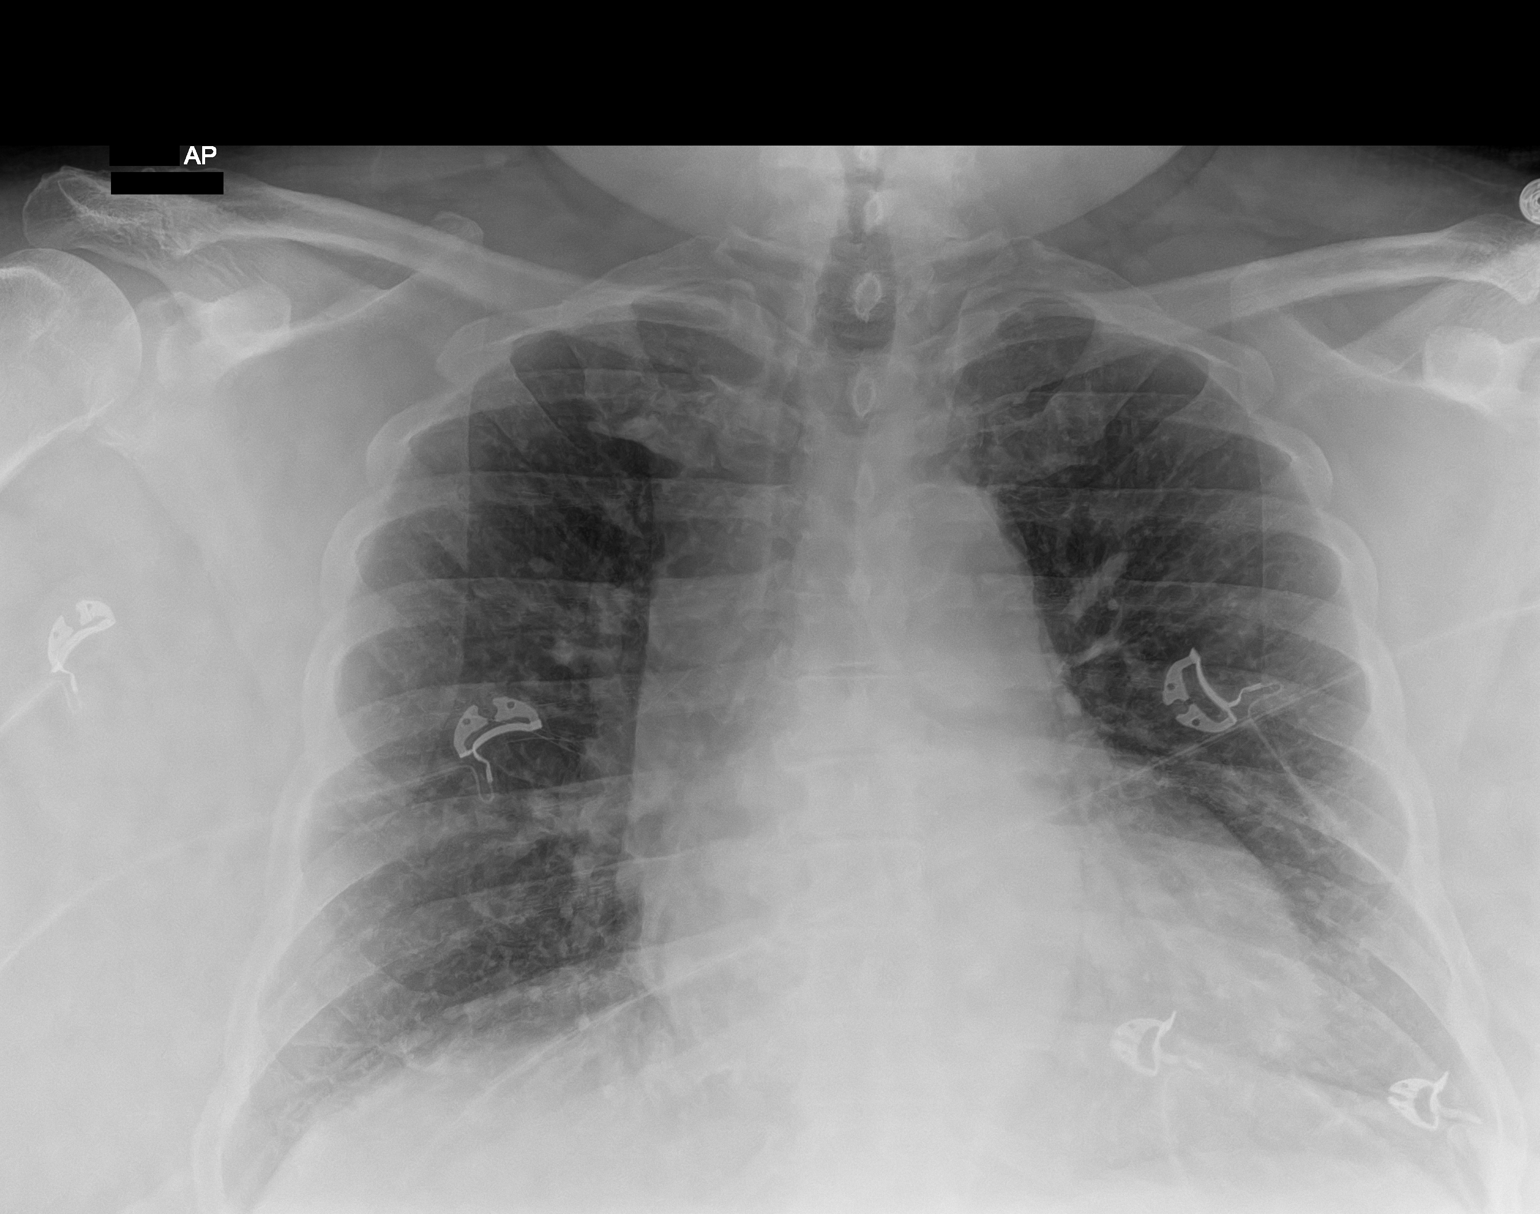

[1 of 1 positions shown; findings below may reference images not displayed]

FINDINGS: Mild enlargement the cardiac silhouette, likely accentuated by
portable AP technique. Mild central pulmonary vascular congestion.
Similar chronic mild interstitial prominence without new
consolidation. No visible pleural effusions or pneumothorax. No
acute osseous abnormality.
IMPRESSION: 1. Mild cardiomegaly and central pulmonary vascular congestion
without overt pulmonary edema.
2. Similar chronic mild interstitial prominence without new
consolidation.

## 2021-06-07 NOTE — Research (Signed)
[id number], [facility]    ? ?FOLLOW-UP PHONE CALLS ? ?SUBJECT ID:  [id number] ?[name], RN  ?Trainer's signature: on Delegation of Authority Log ?Date of the Follow up call:  07-Jun-2021 ? ?Visit Number: 7 ?Start time of the Follow up call: 1315     End time of call: 1328 ?[name] is doing well overall and reports no AEs, SAEs, Hospitalizations or Urgent Care visits. Patient reports that he is taking all his medications as directed including his Lipitor and Plavix. [name] answered all the Study questions appropriately. He has my contact information should he have any questions or concerns.  ? ?- CONTACT ? ? ?Q1;  Was the follow up phone call done with the subject?  '[x]'$   YES  '[]'$   NO ?          If NO, check the following:  ? ?   Q2:    Was the follow up phone call done with a family member  ?             Or caregiver?    '[]'$   YES   '[x]'$   NO  ?        Make note about reasons ___________________________________   ? ?AUTOINJECTOR LABEL:  Still legible?  '[x]'$   Yes  '[]'$   No ? ?- MEDICAL CONDITION     ? ?Q3:  Did any of the following occur?   '[]'$   Death ?      '[]'$   Hospitalization (any cause) ?    NONE    '[]'$   Use of autoinjector ? ?Make note about the type of event, and when it occurred. In case of hospitalization: the ?Location of the hospital and/or the treating physician's contact details  ____________ ?____________________________________________________________________ ?____________________________________________________________________ ? ?Note: remember to report any SAE/AE which has occurred within 30 days after any  ?injection of the study drug on relevant eCRF forms.  ? ?Q4:  Did the subject develop any condition which is an  ?        exclusion criterion?  '[]'$   YES  '[x]'$   NO  ? ?Take note about the occurrence of any exclusion criterion after  randomization: ?_________________________________________________________________ ?__________________________________________________________________ ? ?Q5: Was there any change in subject's antithrombotic therapy?   '[]'$   YES  '[x]'$   NO    ? ?Take not about any change of antithrombotic treatment: _______________________ ?____________________________________________________________________ ?____________________________________________________________________     ? ?- RECOLLECTION OF THE STUDY-SPECFIC TRAINING ? ?Q6: Did the subject correctly reply to the following questions? ? ?     A.  What are common heart attack symptoms?      '[x]'$   YES   '[]'$   NO ?     B.  What has to be done in case any of those symptoms occurs? '[x]'$   YES  '[]'$   NO ?     C.  What are the main steps to perform a self-injection?  '[x]'$   YES  '[]'$   NO ? ?           If NO, then report which step/s was/were missing:  ?      '[]'$   Choose injection site (abdomen or thigh) ?      '[]'$   Twist cap off ?      '[]'$   Pinch skin and place the study autoinjector ?      '[]'$   Firmly push down and hold for 3 seconds ? ?     D. What has to be done immediately  after an injection?  '[x]'$   YES   '[]'$   NO ? ?        If NO, then report which step/s was/were missing: ?      '[]'$   Call  for emergency medical help ?      '[]'$   Show the autoinjector to the emergency medical responder ? ?     E.  Does the subject recall where s/he keeps/ stores the autoinjectors? '[x]'$  YES  '[]'$  NO ?         Note the place of storage and any corrective explanation if needed below ?__________________________________________________________________ ?__________________________________________________________________ ? ?- TRAINING REFRESHER  ? ?Q7:  Is a training refresher needed?      '[]'$  YES  '[x]'$  NO ?       If YES, indicate items that have to be refreshed. More than one may apply:  ? ?            '[]'$   Heart attack symptoms ?            '[]'$   Actions to be taken following heart attack symptoms ?            '[]'$   Steps to perform  the self-injection and follow-up actions to be taken ?  '[]'$   Other, Specify  _________________________________________   ? ? ?Form Based on [facility]_CRF_Version 5.0_27Oct2021 [id number], 62,  ?          and eCRF  Version 5.0-  03-Dec-2019 ? ? ?Current Outpatient Medications:  ?  acetaminophen (TYLENOL) 500 MG tablet, Take 325 mg by mouth daily., Disp: , Rfl:  ?  ALPRAZolam (XANAX) 0.5 MG tablet, TAKE 1 TABLET IN THE MORNING, AT NOON AND AT BEDTIME., Disp: 90 tablet, Rfl: 0 ?  aspirin EC 81 MG tablet, Take 81 mg by mouth daily., Disp: , Rfl:  ?  atorvastatin (LIPITOR) 80 MG tablet, TAKE 1 TABLET ONCE DAILY., Disp: 15 tablet, Rfl: 0 ?  azithromycin (ZITHROMAX) 250 MG tablet, Please dispense as a z-pack, Disp: 6 tablet, Rfl: 0 ?  clopidogrel (PLAVIX) 75 MG tablet, Take 1 tablet (75 mg total) by mouth daily., Disp: 90 tablet, Rfl: 3 ?  escitalopram (LEXAPRO) 10 MG tablet, TAKE 1 TABLET DAILY IN THE AFTERNOON., Disp: 30 tablet, Rfl: 0 ?  ezetimibe (ZETIA) 10 MG tablet, Take 1 tablet (10 mg total) by mouth daily., Disp: 90 tablet, Rfl: 3 ?  glipiZIDE-metformin (METAGLIP) 2.5-500 MG tablet, TAKE 2 TABLETS BY MOUTH IN THE MORNING AND 1 TABLET AT NIGHT, Disp: 270 tablet, Rfl: 1 ?  HYDROcodone-acetaminophen (NORCO) 10-325 MG tablet, Take 1 tablet by mouth every 6 (six) hours as needed for severe pain., Disp: 40 tablet, Rfl: 0 ?  hydrocortisone (ANUSOL-HC) 2.5 % rectal cream, Place 1 application rectally 4 (four) times daily as needed (hemorrhoids/discomfort). , Disp: , Rfl:  ?  LINZESS 145 MCG CAPS capsule, TAKE 1 CAPSULE DAILY AS NEEDED FOR CONSTIPATION, Disp: 30 capsule, Rfl: 0 ?  losartan (COZAAR) 25 MG tablet, Take 1 tablet (25 mg total) by mouth daily., Disp: 90 tablet, Rfl: 0 ?  metoprolol tartrate (LOPRESSOR) 25 MG tablet, Take 0.5 tablets (12.5 mg total) by mouth 2 (two) times daily., Disp: 90 tablet, Rfl: 3 ?  Misc Natural Products (NEURIVA PO), Take 1 tablet by mouth daily in the afternoon., Disp: , Rfl:  ?   nitroGLYCERIN (NITROSTAT) 0.4 MG SL tablet, Place 1 tablet (0.4 mg total) under the tongue every 5 (five) minutes x 3 doses as needed  for chest pain., Disp: 25 tablet, Rfl: 3 ?  Study - [facility] - selatogrel 16 mg/0.5 mL or placebo SQ injection (PI-Christopher), Inject 0.5 mLs (16 mg total) into the skin once for 1 dose. Inject 0.5 mL subcutaneously in the abdomen or thigh if you experience  symptoms of a heart attack. Call 911 immediately  and seek emergency medical support. (Patient not taking: Reported on 09/22/2020), Disp: 0.5 mL, Rfl: 0 ?  terazosin (HYTRIN) 5 MG capsule, TAKE 1 CAPSULE IN THE AFTERNOON DAILY., Disp: 30 capsule, Rfl: 0 ?  terazosin (HYTRIN) 5 MG capsule, TAKE 1 CAPSULE BY MOUTH DAILY IN THE AFTERNOON., Disp: 30 capsule, Rfl: 3 ?  traZODone (DESYREL) 50 MG tablet, TAKE 1 TABLET BY MOUTH AT BEDTIME., Disp: 30 tablet, Rfl: 0   ? ? ? ? ? ? ? ? ? ?                ?

## 2021-06-22 ENCOUNTER — Ambulatory Visit: Payer: Medicare Other

## 2021-06-27 ENCOUNTER — Other Ambulatory Visit: Payer: Self-pay | Admitting: Nurse Practitioner

## 2021-06-27 DIAGNOSIS — G4700 Insomnia, unspecified: Secondary | ICD-10-CM

## 2021-06-28 ENCOUNTER — Other Ambulatory Visit: Payer: Self-pay | Admitting: Nurse Practitioner

## 2021-06-28 DIAGNOSIS — G4700 Insomnia, unspecified: Secondary | ICD-10-CM

## 2021-07-26 ENCOUNTER — Other Ambulatory Visit: Payer: Self-pay | Admitting: Internal Medicine

## 2021-07-27 ENCOUNTER — Other Ambulatory Visit: Payer: Self-pay | Admitting: Internal Medicine

## 2021-08-04 DIAGNOSIS — I10 Essential (primary) hypertension: Secondary | ICD-10-CM | POA: Diagnosis not present

## 2021-08-11 DIAGNOSIS — I10 Essential (primary) hypertension: Secondary | ICD-10-CM | POA: Diagnosis not present

## 2021-08-11 DIAGNOSIS — E785 Hyperlipidemia, unspecified: Secondary | ICD-10-CM | POA: Diagnosis not present

## 2021-08-11 DIAGNOSIS — K5909 Other constipation: Secondary | ICD-10-CM | POA: Diagnosis not present

## 2021-08-11 DIAGNOSIS — G8929 Other chronic pain: Secondary | ICD-10-CM | POA: Diagnosis not present

## 2021-08-11 DIAGNOSIS — K649 Unspecified hemorrhoids: Secondary | ICD-10-CM | POA: Diagnosis not present

## 2021-08-11 DIAGNOSIS — Z0000 Encounter for general adult medical examination without abnormal findings: Secondary | ICD-10-CM | POA: Diagnosis not present

## 2021-08-11 DIAGNOSIS — E1165 Type 2 diabetes mellitus with hyperglycemia: Secondary | ICD-10-CM | POA: Diagnosis not present

## 2021-08-11 DIAGNOSIS — Z955 Presence of coronary angioplasty implant and graft: Secondary | ICD-10-CM | POA: Diagnosis not present

## 2021-08-11 DIAGNOSIS — I2510 Atherosclerotic heart disease of native coronary artery without angina pectoris: Secondary | ICD-10-CM | POA: Diagnosis not present

## 2021-08-11 DIAGNOSIS — R600 Localized edema: Secondary | ICD-10-CM | POA: Diagnosis not present

## 2021-08-11 DIAGNOSIS — G4700 Insomnia, unspecified: Secondary | ICD-10-CM | POA: Diagnosis not present

## 2021-08-16 ENCOUNTER — Encounter: Payer: Self-pay | Admitting: *Deleted

## 2021-08-16 DIAGNOSIS — Z006 Encounter for examination for normal comparison and control in clinical research program: Secondary | ICD-10-CM

## 2021-08-16 NOTE — Research (Signed)
[id number], [facility]     FOLLOW-UP PHONE CALLS  SUBJECT ID:  [id number] Trainer's name: Sandie Ano, RN Trainer's signature: on Delegation of Authority Log Date of the Follow up call:[id number]  Visit Number: 003 Start time of the Follow up call: 3:45                End time of call: 3;56   Patient doing well overall. He started Amolodipine recently but had to stop medication due to swelling in feet.   Q1;  Was the follow up phone call done with the subject?  '[x]'$   YES  '[]'$   NO           If NO, check the following:      Q2:    Was the follow up phone call done with a family member               Or caregiver?    '[]'$   YES   '[x]'$   NO          Make note about reasons ___________________________________    AUTOINJECTOR LABEL:  Still legible?  '[x]'$   Yes  '[]'$   No  - MEDICAL CONDITION      Q3:  Did any of the following occur?   '[]'$   Death No      '[]'$   Hospitalization (any cause)         '[]'$   Use of autoinjector  Make note about the type of event, and when it occurred. In case of hospitalization: the Location of the hospital and/or the treating physician's contact details  ____________ ____________________________________________________________________ ____________________________________________________________________  Note: remember to report any SAE/AE which has occurred within 30 days after any  injection of the study drug on relevant eCRF forms.   Q4:  Did the subject develop any condition which is an          exclusion criterion?  '[]'$   YES  '[x]'$   NO   Take note about the occurrence of any exclusion criterion after randomization: _________________________________________________________________ __________________________________________________________________  Q5: Was there any change in subject's antithrombotic therapy?   '[]'$   YES  '[x]'$   NO     Take not about any change of antithrombotic treatment:  _______________________ ____________________________________________________________________ ____________________________________________________________________      Lezlie Octave OF THE STUDY-SPECFIC TRAINING  Q6: Did the subject correctly reply to the following questions?       A.  What are common heart attack symptoms?      '[x]'$   YES   '[]'$   NO      B.  What has to be done in case any of those symptoms occurs? '[x]'$   YES  '[]'$   NO      C.  What are the main steps to perform a self-injection?  '[x]'$   YES  '[]'$   NO             If NO, then report which step/s was/were missing:        '[]'$   Choose injection site (abdomen or thigh)       '[]'$   Twist cap off       '[]'$   Pinch skin and place the study autoinjector       '[]'$   Firmly push down and hold for 3 seconds       D. What has to be done immediately after an injection?  '[x]'$   YES   '[]'$   NO          If NO, then report which step/s was/were missing:       '[]'$   Call  for emergency medical help       '[]'$   Show the autoinjector to the emergency medical responder       E.  Does the subject recall where s/he keeps/ stores the autoinjectors? '[x]'$  YES  '[]'$  NO          Note the place of storage and any corrective explanation if needed below __________________________________________________________________ __________________________________________________________________  - TRAINING REFRESHER   Q7:  Is a training refresher needed?      '[]'$  YES  '[x]'$  NO        If YES, indicate items that have to be refreshed. More than one may apply:               '[]'$   Heart attack symptoms             '[]'$   Actions to be taken following heart attack symptoms             '[]'$   Steps to perform the self-injection and follow-up actions to be taken   '[]'$   Other, Specify  _________________________________________       Current Outpatient Medications:    acetaminophen (TYLENOL) 500 MG tablet, Take 325 mg by mouth daily., Disp: , Rfl:    ALPRAZolam (XANAX) 0.5 MG tablet, TAKE 1 TABLET  IN THE MORNING, AT NOON AND AT BEDTIME., Disp: 90 tablet, Rfl: 0   aspirin EC 81 MG tablet, Take 81 mg by mouth daily., Disp: , Rfl:    atorvastatin (LIPITOR) 80 MG tablet, TAKE 1 TABLET ONCE DAILY., Disp: 30 tablet, Rfl: 1   azithromycin (ZITHROMAX) 250 MG tablet, Please dispense as a z-pack, Disp: 6 tablet, Rfl: 0   clopidogrel (PLAVIX) 75 MG tablet, Take 1 tablet (75 mg total) by mouth daily., Disp: 90 tablet, Rfl: 3   escitalopram (LEXAPRO) 10 MG tablet, TAKE 1 TABLET DAILY IN THE AFTERNOON., Disp: 30 tablet, Rfl: 0   ezetimibe (ZETIA) 10 MG tablet, TAKE 1 TABLET ONCE DAILY., Disp: 30 tablet, Rfl: 2   glipiZIDE-metformin (METAGLIP) 2.5-500 MG tablet, TAKE 2 TABLETS BY MOUTH IN THE MORNING AND 1 TABLET AT NIGHT, Disp: 270 tablet, Rfl: 1   HYDROcodone-acetaminophen (NORCO) 10-325 MG tablet, Take 1 tablet by mouth every 6 (six) hours as needed for severe pain., Disp: 40 tablet, Rfl: 0   hydrocortisone (ANUSOL-HC) 2.5 % rectal cream, Place 1 application rectally 4 (four) times daily as needed (hemorrhoids/discomfort). , Disp: , Rfl:    LINZESS 145 MCG CAPS capsule, TAKE 1 CAPSULE DAILY AS NEEDED FOR CONSTIPATION, Disp: 30 capsule, Rfl: 0   losartan (COZAAR) 25 MG tablet, Take 1 tablet (25 mg total) by mouth daily., Disp: 90 tablet, Rfl: 0   metoprolol tartrate (LOPRESSOR) 25 MG tablet, Take 0.5 tablets (12.5 mg total) by mouth 2 (two) times daily., Disp: 90 tablet, Rfl: 3   Misc Natural Products (NEURIVA PO), Take 1 tablet by mouth daily in the afternoon., Disp: , Rfl:    nitroGLYCERIN (NITROSTAT) 0.4 MG SL tablet, Place 1 tablet (0.4 mg total) under the tongue every 5 (five) minutes x 3 doses as needed for chest pain., Disp: 25 tablet, Rfl: 3   Study - [facility] - selatogrel 16 mg/0.5 mL or placebo SQ injection (PI-Christopher), Inject 0.5 mLs (16 mg total) into the skin once for 1 dose. Inject 0.5 mL subcutaneously in the abdomen or thigh if you experience  symptoms of a heart attack. Call 911  immediately  and seek emergency medical support. (Patient not taking: Reported  on 09/22/2020), Disp: 0.5 mL, Rfl: 0   terazosin (HYTRIN) 5 MG capsule, TAKE 1 CAPSULE IN THE AFTERNOON DAILY., Disp: 30 capsule, Rfl: 0   terazosin (HYTRIN) 5 MG capsule, TAKE 1 CAPSULE BY MOUTH DAILY IN THE AFTERNOON., Disp: 30 capsule, Rfl: 3   traZODone (DESYREL) 50 MG tablet, TAKE 1 TABLET BY MOUTH AT BEDTIME., Disp: 30 tablet, Rfl: 0

## 2021-09-01 ENCOUNTER — Telehealth: Payer: Self-pay | Admitting: *Deleted

## 2021-09-01 NOTE — Patient Outreach (Signed)
  Care Management   Outreach Note  09/01/2021  Name: Steven Stevens MRN: [id number] DOB: [birth year]  Reason for Referral: Care Coordination - Assessment of Needs.   An unsuccessful telephone outreach was attempted today. The patient was referred to the case management team for assistance with care management and care coordination. A HIPAA compliant message was left on voicemail for patient, providing contact information for CSW, encouraging patient to return the call at his earliest convenience.   Follow Up Plan:  [name] will reschedule initial telephone outreach call for patient with CSW.   Steven Stevens, BSW, MSW, LCSW  Licensed Education officer, [facility]  Mailing [place] [address], [place], [place] [address] Physical Address-[address] [place], [place] [address] Toll Free Main # [phone] Fax # [phone] Cell # [phone] [email or web address]

## 2021-09-09 ENCOUNTER — Other Ambulatory Visit: Payer: Self-pay | Admitting: *Deleted

## 2021-09-09 NOTE — Patient Outreach (Signed)
  Care Coordination   09/09/2021  Name: Steven Stevens MRN: [id number] DOB: [birth year]   Care Coordination Outreach Attempts:  A second unsuccessful outreach was attempted today to offer the patient with information about available care coordination services as a benefit of their health plan.   A HIPAA compliant message was left on voicemail, providing contact information for CSW, encouraging patient to return CSW's call at his earliest convenience.  Follow Up Plan:  Additional outreach attempts will be made to offer the patient care coordination information and services.   Encounter Outcome:  No Answer.   Care Coordination Interventions Activated:  No   Care Coordination Interventions:  No, not indicated.    Nat Christen, BSW, MSW, LCSW  Licensed Education officer, [facility]  Mailing [place] [address], [place], [place] [address] Physical Address[address], [place],  [address] Toll Free Main # [phone] Fax # [phone] Cell # [phone] [name]

## 2021-09-18 ENCOUNTER — Other Ambulatory Visit: Payer: Self-pay | Admitting: *Deleted

## 2021-09-18 NOTE — Patient Outreach (Signed)
  Care Coordination   09/18/2021  Name: Steven Stevens MRN: [id number] DOB: [birth year]   Care Coordination Outreach Attempts:  A third unsuccessful outreach was attempted today to offer the patient with information about available care coordination services as a benefit of their health plan. HIPAA compliant messages left on voicemail, providing contact information for CSW, encouraging patient to return CSW's call at his earliest convenience.  Follow Up Plan:  No further outreach attempts will be made at this time. We have been unable to contact the patient to offer or enroll patient in care coordination services.  Encounter Outcome:  No Answer.   Care Coordination Interventions Activated:  No.    Care Coordination Interventions:  No, not indicated.    Nat Christen, BSW, MSW, LCSW  Licensed Education officer, [facility]  Mailing [place] [address], [place], [place] [address] Physical Address[address], [place], [place] [address] Toll Free Main # [phone] Fax # [phone] Cell # [phone] [name]

## 2021-09-24 ENCOUNTER — Other Ambulatory Visit: Payer: Self-pay | Admitting: Internal Medicine

## 2021-09-26 ENCOUNTER — Other Ambulatory Visit: Payer: Self-pay

## 2021-10-20 DIAGNOSIS — Z7984 Long term (current) use of oral hypoglycemic drugs: Secondary | ICD-10-CM | POA: Diagnosis not present

## 2021-10-20 DIAGNOSIS — H5213 Myopia, bilateral: Secondary | ICD-10-CM | POA: Diagnosis not present

## 2021-10-20 DIAGNOSIS — H524 Presbyopia: Secondary | ICD-10-CM | POA: Diagnosis not present

## 2021-10-20 DIAGNOSIS — H52202 Unspecified astigmatism, left eye: Secondary | ICD-10-CM | POA: Diagnosis not present

## 2021-10-20 DIAGNOSIS — E119 Type 2 diabetes mellitus without complications: Secondary | ICD-10-CM | POA: Diagnosis not present

## 2021-10-20 DIAGNOSIS — Z961 Presence of intraocular lens: Secondary | ICD-10-CM | POA: Diagnosis not present

## 2021-11-16 DIAGNOSIS — E1165 Type 2 diabetes mellitus with hyperglycemia: Secondary | ICD-10-CM | POA: Diagnosis not present

## 2021-11-16 DIAGNOSIS — E785 Hyperlipidemia, unspecified: Secondary | ICD-10-CM | POA: Diagnosis not present

## 2021-11-17 ENCOUNTER — Telehealth: Payer: Self-pay | Admitting: *Deleted

## 2021-11-17 DIAGNOSIS — Z006 Encounter for examination for normal comparison and control in clinical research program: Secondary | ICD-10-CM

## 2021-11-17 NOTE — Telephone Encounter (Signed)
I called patient to schedule next [facility] study visit. I will need patient to bring back old study medication. We will change out study medication . I scheduled patient Nov. 1 at 11am.

## 2021-11-21 DIAGNOSIS — G4700 Insomnia, unspecified: Secondary | ICD-10-CM | POA: Diagnosis not present

## 2021-11-21 DIAGNOSIS — I2510 Atherosclerotic heart disease of native coronary artery without angina pectoris: Secondary | ICD-10-CM | POA: Diagnosis not present

## 2021-11-21 DIAGNOSIS — E1165 Type 2 diabetes mellitus with hyperglycemia: Secondary | ICD-10-CM | POA: Diagnosis not present

## 2021-11-21 DIAGNOSIS — K649 Unspecified hemorrhoids: Secondary | ICD-10-CM | POA: Diagnosis not present

## 2021-11-21 DIAGNOSIS — E785 Hyperlipidemia, unspecified: Secondary | ICD-10-CM | POA: Diagnosis not present

## 2021-11-21 DIAGNOSIS — G8929 Other chronic pain: Secondary | ICD-10-CM | POA: Diagnosis not present

## 2021-11-21 DIAGNOSIS — I10 Essential (primary) hypertension: Secondary | ICD-10-CM | POA: Diagnosis not present

## 2021-11-21 DIAGNOSIS — K5904 Chronic idiopathic constipation: Secondary | ICD-10-CM | POA: Diagnosis not present

## 2021-11-21 DIAGNOSIS — Z955 Presence of coronary angioplasty implant and graft: Secondary | ICD-10-CM | POA: Diagnosis not present

## 2021-11-23 ENCOUNTER — Other Ambulatory Visit: Payer: Self-pay | Admitting: Internal Medicine

## 2021-11-29 ENCOUNTER — Telehealth: Payer: Self-pay | Admitting: *Deleted

## 2021-11-29 DIAGNOSIS — Z006 Encounter for examination for normal comparison and control in clinical research program: Secondary | ICD-10-CM

## 2021-11-29 NOTE — Telephone Encounter (Signed)
I called patient to cancel his [facility] study visit on  12/07/21. I have not received the study medication. I will call him to schedule after I receive the study medication.

## 2021-12-12 ENCOUNTER — Encounter: Payer: Medicare Other | Admitting: *Deleted

## 2021-12-12 VITALS — BP 158/67 | HR 50 | Temp 98.0°F | Resp 16

## 2021-12-12 DIAGNOSIS — Z006 Encounter for examination for normal comparison and control in clinical research program: Secondary | ICD-10-CM

## 2021-12-12 MED ORDER — STUDY - SOS-AMI - SELATOGREL 16 MG/0.5 ML OR PLACEBO SQ INJECTION (PI-CHRISTOPHER): 16.0000 mg | INJECTION | Freq: Once | SUBCUTANEOUS | 0 refills | Status: DC

## 2021-12-12 NOTE — Research (Signed)
HERE FOR VISIT 9 Patient doing well, no complains of chest pains.                         [id number], [facility]     FOLLOW-UP VISIT 9  SUBJECT ID:  [id number] Trainer's name: Philemon Kingdom :)  Trainer's signature: Philemon Kingdom :) on Delegation of Authority Log Date of the Follow up VISIT:           12-Dec-2021  Visit Number: 9 Start time of the Follow up VISIT:  1310              End time of VISIT: 1478   - CONTACT   Q1;  Was the follow up VISIT done with the subject?  '[x]'$   YES  '[]'$   NO           If NO, check the following:      Q2:    Was the follow up phone call done with a family member               Or caregiver?    '[]'$   YES   '[x]'$   NO          Make note about reasons ___________________________________    AUTOINJECTOR LABEL:  Still legible?  '[x]'$   Yes  '[]'$   No  - MEDICAL CONDITION      Q3:  Did any of the following occur? N/A'[x]'$           '[]'$   Death       '[]'$   Hospitalization (any cause)         '[]'$   Use of autoinjector  Make note about the type of event, and when it occurred. In case of hospitalization: the Location of the hospital and/or the treating physician's contact details  ____________ ____________________________________________________________________ ____________________________________________________________________  Note: remember to report any SAE/AE which has occurred within 30 days after any  injection of the study drug on relevant eCRF forms.   Q4:  Did the subject develop any condition which is an          exclusion criterion?  '[]'$   YES  '[x]'$   NO   Take note about the occurrence of any exclusion criterion after randomization: _________________________________________________________________ __________________________________________________________________  Q5: Was there any change in subject's antithrombotic therapy?   '[]'$   YES  '[x]'$   NO     Take not about any change of antithrombotic treatment:  _______________________ ____________________________________________________________________ ____________________________________________________________________      Lezlie Octave OF THE STUDY-SPECFIC TRAINING  Q6: Did the subject correctly reply to the following questions?       A.  What are common heart attack symptoms?      '[x]'$   YES   '[]'$   NO      B.  What has to be done in case any of those symptoms occurs? '[x]'$   YES  '[]'$   NO      C.  What are the main steps to perform a self-injection?  '[x]'$   YES  '[]'$   NO             If NO, then report which step/s was/were missing:        '[]'$   Choose injection site (abdomen or thigh)       '[]'$   Twist cap off       '[]'$   Pinch skin and place the study autoinjector       '[]'$   Firmly push down and hold for 3 seconds  D. What has to be done immediately after an injection?  '[x]'$   YES   '[]'$   NO          If NO, then report which step/s was/were missing:       '[]'$   Call  for emergency medical help       '[]'$   Show the autoinjector to the emergency medical responder       E.  Does the subject recall where s/he keeps/ stores the autoinjectors? '[x]'$  YES  '[]'$  NO          Note the place of storage and any corrective explanation if needed below __________________________________________________________________ __________________________________________________________________  - TRAINING REFRESHER   Q7:  Is a training refresher needed?      '[]'$  YES  '[x]'$  NO        If YES, indicate items that have to be refreshed. More than one may apply:               '[]'$   Heart attack symptoms             '[]'$   Actions to be taken following heart attack symptoms             '[]'$   Steps to perform the self-injection and follow-up actions to be taken   '[]'$   Other, Specify  _________________________________________    KITS BROUGHT BACK BY [facility]: [id number]; [id number]  NEW KITS GIVEN TO PATIENT:  [id number]      Current Outpatient Medications:    acetaminophen (TYLENOL) 500 MG  tablet, Take 325 mg by mouth daily., Disp: , Rfl:    ALPRAZolam (XANAX) 0.5 MG tablet, TAKE 1 TABLET IN THE MORNING, AT NOON AND AT BEDTIME., Disp: 90 tablet, Rfl: 0   amLODipine (NORVASC) 5 MG tablet, Take 5 mg by mouth daily., Disp: , Rfl:    aspirin EC 81 MG tablet, Take 81 mg by mouth daily., Disp: , Rfl:    atorvastatin (LIPITOR) 80 MG tablet, TAKE 1 TABLET ONCE DAILY., Disp: 90 tablet, Rfl: 0   clopidogrel (PLAVIX) 75 MG tablet, Take 1 tablet (75 mg total) by mouth daily., Disp: 90 tablet, Rfl: 3   escitalopram (LEXAPRO) 10 MG tablet, TAKE 1 TABLET DAILY IN THE AFTERNOON., Disp: 30 tablet, Rfl: 0   ezetimibe (ZETIA) 10 MG tablet, TAKE 1 TABLET ONCE DAILY., Disp: 30 tablet, Rfl: 0   glipiZIDE-metformin (METAGLIP) 2.5-500 MG tablet, TAKE 2 TABLETS BY MOUTH IN THE MORNING AND 1 TABLET AT NIGHT, Disp: 270 tablet, Rfl: 1   HYDROcodone-acetaminophen (NORCO) 10-325 MG tablet, Take 1 tablet by mouth every 6 (six) hours as needed for severe pain., Disp: 40 tablet, Rfl: 0   hydrocortisone (ANUSOL-HC) 2.5 % rectal cream, Place 1 application rectally 4 (four) times daily as needed (hemorrhoids/discomfort). , Disp: , Rfl:    LINZESS 145 MCG CAPS capsule, TAKE 1 CAPSULE DAILY AS NEEDED FOR CONSTIPATION, Disp: 30 capsule, Rfl: 0   losartan (COZAAR) 100 MG tablet, Take 100 mg by mouth daily., Disp: , Rfl:    metoprolol tartrate (LOPRESSOR) 25 MG tablet, Take 0.5 tablets (12.5 mg total) by mouth 2 (two) times daily., Disp: 90 tablet, Rfl: 3   Misc Natural Products (NEURIVA PO), Take 1 tablet by mouth daily in the afternoon., Disp: , Rfl:    nitroGLYCERIN (NITROSTAT) 0.4 MG SL tablet, Place 1 tablet (0.4 mg total) under the tongue every 5 (five) minutes x 3 doses as needed for chest pain., Disp: 25 tablet, Rfl: 3  terazosin (HYTRIN) 5 MG capsule, TAKE 1 CAPSULE BY MOUTH DAILY IN THE AFTERNOON., Disp: 30 capsule, Rfl: 3   traZODone (DESYREL) 50 MG tablet, TAKE 1 TABLET BY MOUTH AT BEDTIME., Disp: 30 tablet,  Rfl: 0   Study - [facility] - selatogrel 16 mg/0.5 mL or placebo SQ injection (PI-Christopher), Inject 0.5 mLs (16 mg total) into the skin once for 1 dose. Inject 0.5 mL subcutaneously in the abdomen or thigh if you experience  symptoms of a heart attack. Call 911 immediately  and seek emergency medical support., Disp: 1 mL, Rfl: 0

## 2021-12-22 ENCOUNTER — Other Ambulatory Visit: Payer: Self-pay | Admitting: Internal Medicine

## 2021-12-22 NOTE — Progress Notes (Signed)
Cardiology Office Note   Date:  12/23/2021   ID:  Steven Stevens, DOB August 03, 1945, MRN [id number]  PCP:  Celene Squibb, MD  Cardiologist:   [name], MD   F/U of CAD     History of Present Illness: Steven Stevens is a 76 y.o. male with a history of CAD He is s/p PTCA/ DES to RCA in 2005; s/p PTCA/DES to RCA in 2017; s/p PCI with DES to CTO  in March 2017)  Also he has a hstory of COPD, morbid obesity, HL and type II DM  In 2022 admitted with NSTEMI   Underwent cath with PTCA/Stent to LAD      I saw the pt in clinic in Aug 2022 Patient has complained of indigestion  Under increased stress with famly    No CP   Breathing is not as good as once was after last procedure    Not quite as good      No CP    Some swelling in ankles     Lunch   Whatever want    Dinner Diet drinks    Water    +   Current Meds  Medication Sig   acetaminophen (TYLENOL) 500 MG tablet Take 325 mg by mouth daily.   ALPRAZolam (XANAX) 0.5 MG tablet TAKE 1 TABLET IN THE MORNING, AT NOON AND AT BEDTIME.   amLODipine (NORVASC) 2.5 MG tablet Take 1 tablet (2.5 mg total) by mouth daily.   aspirin EC 81 MG tablet Take 81 mg by mouth daily.   atorvastatin (LIPITOR) 80 MG tablet TAKE 1 TABLET ONCE DAILY.   clopidogrel (PLAVIX) 75 MG tablet Take 1 tablet (75 mg total) by mouth daily.   escitalopram (LEXAPRO) 10 MG tablet TAKE 1 TABLET DAILY IN THE AFTERNOON.   ezetimibe (ZETIA) 10 MG tablet TAKE 1 TABLET ONCE DAILY.   glipiZIDE-metformin (METAGLIP) 2.5-500 MG tablet TAKE 2 TABLETS BY MOUTH IN THE MORNING AND 1 TABLET AT NIGHT   HYDROcodone-acetaminophen (NORCO) 10-325 MG tablet Take 1 tablet by mouth every 6 (six) hours as needed for severe pain.   LINZESS 145 MCG CAPS capsule TAKE 1 CAPSULE DAILY AS NEEDED FOR CONSTIPATION   losartan (COZAAR) 100 MG tablet Take 100 mg by mouth daily.   metoprolol tartrate (LOPRESSOR) 25 MG tablet Take 0.5 tablets (12.5 mg total) by mouth 2 (two) times daily.   Misc Natural  Products (NEURIVA PO) Take 1 tablet by mouth daily in the afternoon.   Study - [facility] - selatogrel 16 mg/0.5 mL or placebo SQ injection (PI-Christopher) Inject 0.5 mLs (16 mg total) into the skin once for 1 dose. Inject 0.5 mL subcutaneously in the abdomen or thigh if you experience  symptoms of a heart attack. Call 911 immediately  and seek emergency medical support.   terazosin (HYTRIN) 5 MG capsule TAKE 1 CAPSULE BY MOUTH DAILY IN THE AFTERNOON.   traZODone (DESYREL) 50 MG tablet TAKE 1 TABLET BY MOUTH AT BEDTIME.   [DISCONTINUED] amLODipine (NORVASC) 5 MG tablet Take 5 mg by mouth daily.     Allergies:   Synjardy xr [empagliflozin-metformin hcl er]   Past Medical History:  Diagnosis Date   Anxiety    Chronic lower back pain    Coronary artery disease    DES to RCA 2005, DES to RCA 2017, DES x2 to CTO LAD 2017   Depression    Hyperlipidemia    Hypertension    Myocardial infarct ([place]) 01/27/2004  Type 2 diabetes mellitus ([place])    Type II diabetes mellitus ([place])     Past Surgical History:  Procedure Laterality Date   BACK SURGERY     CARDIAC CATHETERIZATION N/A 03/30/2015   Procedure: Left Heart Cath and Coronary Angiography;  Surgeon: Jettie Booze, MD;  Location: [facility];  Service: Cardiovascular;  Laterality: N/A;   CARDIAC CATHETERIZATION  03/30/2015   Procedure: Coronary Stent Intervention;  Surgeon: Jettie Booze, MD;  Location: [facility];  Service: Cardiovascular;;   CARDIAC CATHETERIZATION N/A 04/14/2015   Procedure: Coronary/Bypass Graft CTO Intervention;  Surgeon: Jettie Booze, MD;  Location: [facility];  Service: Cardiovascular;  Laterality: N/A;   CARDIAC CATHETERIZATION  04/14/2015   Procedure: Left Heart Cath and Coronary Angiography;  Surgeon: Jettie Booze, MD;  Location: [facility];  Service: Cardiovascular;;   CATARACT EXTRACTION W/PHACO Right 12/03/2012   Procedure: CATARACT EXTRACTION RIGHT EYE (WITH  PHACO) AND INTRAOCULAR LENS PLACEMENT  CDE=2.41;  Surgeon: Elta Guadeloupe [name], MD;  Location: [facility];  Service: Ophthalmology;  Laterality: Right;   CATARACT EXTRACTION W/PHACO Left 12/17/2012   Procedure: CATARACT EXTRACTION PHACO AND INTRAOCULAR LENS PLACEMENT (IOC);  Surgeon: Elta Guadeloupe [name], MD;  Location: [facility];  Service: Ophthalmology;  Laterality: Left;  CDE:7.82   COLON SURGERY N/A    Phreesia 01/11/2020   COLONOSCOPY  2011   Dr. Gala Romney: diverticulosis, tubular adenomas   COLONOSCOPY WITH PROPOFOL N/A 07/24/2016   Rourk: 3 tubular adenomas removed.  Diverticulosis.  3-year surveillance colonoscopy recommended.   COLONOSCOPY WITH PROPOFOL N/A 12/15/2019   Procedure: COLONOSCOPY WITH PROPOFOL;  Surgeon: Daneil Dolin, MD;  [facility];  Service: Endoscopy;  Laterality: N/A;  8:30am   CORONARY ANGIOPLASTY WITH STENT PLACEMENT  2005   CORONARY STENT INTERVENTION N/A 04/12/2020   Procedure: CORONARY STENT INTERVENTION;  Surgeon: Sherren Mocha, MD;  Location: [facility];  Service: Cardiovascular;  Laterality: N/A;   CORONARY STENT PLACEMENT  03/30/2015   RCA    DES   EYE SURGERY N/A    Phreesia 01/11/2020   HERNIA REPAIR     INTRAVASCULAR IMAGING/OCT N/A 04/12/2020   Procedure: INTRAVASCULAR IMAGING/OCT;  Surgeon: Sherren Mocha, MD;  Location: [facility];  Service: Cardiovascular;  Laterality: N/A;   KNEE ARTHROSCOPY Left 2012   LEFT HEART CATH AND CORONARY ANGIOGRAPHY N/A 04/12/2020   Procedure: LEFT HEART CATH AND CORONARY ANGIOGRAPHY;  Surgeon: Sherren Mocha, MD;  Location: [facility];  Service: Cardiovascular;  Laterality: N/A;   [place]   "herniated disc"   POLYPECTOMY  07/24/2016   Procedure: POLYPECTOMY;  Surgeon: Daneil Dolin, MD;  [facility];  Service: Endoscopy;;  ascending colon polyp, splenic flexure polyps times 2   POLYPECTOMY  12/15/2019   Procedure: POLYPECTOMY;  Surgeon: Daneil Dolin, MD;  [facility];  Service: Endoscopy;;   UMBILICAL HERNIA REPAIR  0865H   YAG LASER APPLICATION Right 84/69/6295   Procedure: YAG LASER APPLICATION;  Surgeon: Elta Guadeloupe [name], MD;  Location: [facility];  Service: Ophthalmology;  Laterality: Right;     Social History:  The patient  reports that he quit smoking about 17 years ago. His smoking use included cigarettes. He has a 1.00 pack-year smoking history. He has never used smokeless tobacco. He reports that he does not drink alcohol and does not use drugs.     Family History:  The patient's family history includes Colon cancer in his mother.    ROS:  Please see the history of present illness. All other systems are reviewed and  Negative to the above problem except as noted.    PHYSICAL EXAM: VS:  BP 122/80 (BP Location: Left Arm, Patient Position: Sitting, Cuff Size: Large)   Pulse 67   Ht 5' 8.5" (1.74 m)   Wt 299 lb 3.2 oz (135.7 kg)   SpO2 95%   BMI 44.83 kg/m   GEN:  Mobridly obese 76 yo in no acute distress  HEENT: normal  Neck: no JVD, carotid bruits Cardiac: RRR; no murmurs Tr to 1+ LE  edema    Respiratory:  clear to auscultation bilaterally, normal work of breathing GI: Obese  nontender   MS: no deformity Moving all extremities   Skin: warm and dry, no rash Neuro:  Strength and sensation are intact Psych: euthymic mood, full affect   EKG:  EKG is ordered today.    SR 69  rSR1 V1    Possible IWMI CATH   March 2022   CARDIAC CATH: 04/12/2020 1. Patent stents in the LAD and RCA with mild diffuse in-stent restneosis 2. Severe eccentric stenosis of the proximal LAD, treated with PCI using a 4.0x22 mm Resolute Onyx DES under OCT guidance.  3. Moderately elevated LVEDP   Recommend: ASA and clopidogrel at least 12 months without interruption, aggressive medical therapy. Favor long-term DAPT if tolerated. OK for discharge tomorrow if no complicating features.  Left Anterior Descending  The vessel exhibits minimal luminal  irregularities.  Collaterals  Dist LAD filled by collaterals from 1st Diag.     Ost LAD to Prox LAD lesion is 90% stenosed. The lesion is eccentric.  Mid LAD to Dist LAD lesion is 25% stenosed. The lesion was previously treated using a drug eluting stent over 2 years ago. Mild diffuse ISR without any flow-limiting lesions identified.  Previously placed Dist LAD drug eluting stent is widely patent.  Left Circumflex  The vessel exhibits minimal luminal irregularities.  Right Coronary Artery  The vessel exhibits minimal luminal irregularities.  Dist RCA-1 lesion is 25% stenosed. The lesion was previously treatedover 2 years ago.  Non-stenotic Dist RCA-2 lesion was previously treated. The lesion is type C and located at the major branch.  Intervention     Lipid Panel    Component Value Date/Time   CHOL 104 01/20/2021 1356   TRIG 71 01/20/2021 1356   HDL 44 01/20/2021 1356   CHOLHDL 3.7 04/12/2020 1744   VLDL 23 04/12/2020 1744   LDLCALC 45 01/20/2021 1356      Wt Readings from Last 3 Encounters:  12/23/21 299 lb 3.2 oz (135.7 kg)  01/20/21 (!) 305 lb 1.9 oz (138.4 kg)  11/05/20 297 lb 1.3 oz (134.8 kg)      ASSESSMENT AND PLAN:  1  CAD Patient denies CP   Does says his energy is a little less than right after stent     FOllow   Call if worsens Will review use of DAPT      2  HL  LIpids in October 2023  LDL 38    Keep on same meds     in Study   On Zetia   3  HTN  PT has some LE edema   Will cut back on Amlodipine to 2.5  Follow    If improves and BP ok continue   If BP higher then write in via  MyChart   Will change meds   4  DM  A1C in OCtober 2023 was 7   Discussed diet, TRE    Plan for f/u in July 2024   Current medicines are reviewed at length with the patient today.  The patient does not have concerns regarding medicines.  Signed, [name], MD  12/23/2021 3:54 PM    [address], [place], [place]  [address] Phone: [phone]; Fax: [phone]

## 2021-12-23 ENCOUNTER — Ambulatory Visit: Payer: Medicare Other | Attending: Internal Medicine | Admitting: Internal Medicine

## 2021-12-23 ENCOUNTER — Encounter: Payer: Self-pay | Admitting: Internal Medicine

## 2021-12-23 VITALS — BP 122/80 | HR 67 | Ht 68.5 in | Wt 299.2 lb

## 2021-12-23 DIAGNOSIS — I10 Essential (primary) hypertension: Secondary | ICD-10-CM | POA: Diagnosis not present

## 2021-12-23 MED ORDER — AMLODIPINE BESYLATE 2.5 MG PO TABS: 2.5000 mg | ORAL_TABLET | Freq: Every day | ORAL | 3 refills | Status: DC

## 2021-12-23 NOTE — Patient Instructions (Signed)
Medication Instructions:  Decrease Amlodipine to 2.5 mg Daily   Monitor for swelling, and send MyChart message.   *If you need a refill on your cardiac medications before your next appointment, please call your pharmacy*   Lab Work: NONE   If you have labs (blood work) drawn today and your tests are completely normal, you will receive your results only by: [place] (if you have MyChart) OR A paper copy in the mail If you have any lab test that is abnormal or we need to change your treatment, we will call you to review the results.   Testing/Procedures: NONE    Follow-Up: At [facility], you and your health needs are our priority.  As part of our continuing mission to provide you with exceptional heart care, we have created designated Provider Care Teams.  These Care Teams include your primary Cardiologist (physician) and Advanced Practice Providers (APPs -  Physician Assistants and Nurse Practitioners) who all work together to provide you with the care you need, when you need it.  We recommend signing up for the patient portal called "MyChart".  Sign up information is provided on this After Visit Summary.  MyChart is used to connect with patients for Virtual Visits (Telemedicine).  Patients are able to view lab/test results, encounter notes, upcoming appointments, etc.  Non-urgent messages can be sent to your provider as well.   To learn more about what you can do with MyChart, go to [email or web address].    Your next appointment:    July   The format for your next appointment:   In Person  Provider:   You may see [name], MD or one of the following Advanced Practice Providers on your designated Care Team:   Bernerd Pho, PA-C  Ermalinda Barrios, PA-C     Other Instructions Thank you for choosing [facility]!    Important Information About Sugar

## 2022-01-19 ENCOUNTER — Other Ambulatory Visit: Payer: Self-pay | Admitting: Internal Medicine

## 2022-02-15 ENCOUNTER — Encounter: Payer: Self-pay | Admitting: *Deleted

## 2022-02-15 DIAGNOSIS — Z006 Encounter for examination for normal comparison and control in clinical research program: Secondary | ICD-10-CM

## 2022-02-15 NOTE — Research (Addendum)
[id number], [facility]     FOLLOW-UP PHONE CALLS  SUBJECT ID:  [id number] Trainer's name: Belva Bertin Trainer's signature: on Delegation of Authority Log Date of the Follow up call:           10Jan2024 Visit Number: 10 Start time of the Follow up call:  09:54               End time of call: 10:10   - CONTACT   Q1;  Was the follow up phone call done with the subject?    YES    NO           If NO, check the following:      Q2:    Was the follow up phone call done with a family member               Or caregiver?      YES     NO          Make note about reasons ___________________________________    AUTOINJECTOR LABEL:  Still legible?    Yes    No  - MEDICAL CONDITION      Q3:  Did any of the following occur?     Death         Hospitalization (any cause)    None of the events       Use of autoinjector  Make note about the type of event, and when it occurred. In case of hospitalization: the Location of the hospital and/or the treating physician's contact details  ____________ ____________________________________________________________________ ____________________________________________________________________  Note: remember to report any SAE/AE which has occurred within 30 days after any  injection of the study drug on relevant eCRF forms.   Q4:  Did the subject develop any condition which is an          exclusion criterion?    YES    NO   Take note about the occurrence of any exclusion criterion after randomization: _________________________________________________________________ __________________________________________________________________  Q5: Was there any change in subject's antithrombotic therapy?     YES    NO     Take not about any change of antithrombotic treatment:  _______________________ ____________________________________________________________________ ____________________________________________________________________      Tedra [place] OF THE STUDY-SPECFIC TRAINING  Q6: Did the subject correctly reply to the following questions?       A.  What are common heart attack symptoms?        YES     NO      B.  What has to be done in case any of those symptoms occurs?   YES    NO      C.  What are the main steps to perform a self-injection?    YES    NO             If NO, then report which step/s was/were missing:          Choose injection site (abdomen or thigh)         Twist cap off         Pinch skin and place the study autoinjector         Firmly push down and hold for 3 seconds       D. What has to be done immediately after an injection?    YES     NO          If NO, then report which step/s was/were missing:         Call  for emergency medical help       []   Show the autoinjector to the emergency medical responder       E.  Does the subject recall where s/he keeps/ stores the autoinjectors? [x]  YES  []  NO          Note the place of storage and any corrective explanation if needed below __________________________________________________________________ __________________________________________________________________  - TRAINING REFRESHER   Q7:  Is a training refresher needed?      []  YES  [x]  NO        If YES, indicate items that have to be refreshed. More than one may apply:               []   Heart attack symptoms             []   Actions to be taken following heart attack symptoms             []   Steps to perform the self-injection and follow-up actions to be taken   []   Other, Specify  _________________________________________       Current Outpatient Medications:    acetaminophen (TYLENOL) 500 MG tablet, Take 325 mg by mouth daily., Disp: , Rfl:    ALPRAZolam (XANAX) 0.5 MG tablet, TAKE 1 TABLET  IN THE MORNING, AT NOON AND AT BEDTIME., Disp: 90 tablet, Rfl: 0   amLODipine (NORVASC) 2.5 MG tablet, Take 1 tablet (2.5 mg total) by mouth daily., Disp: 90 tablet, Rfl: 3   aspirin EC 81 MG tablet, Take 81 mg by mouth daily., Disp: , Rfl:    atorvastatin (LIPITOR) 80 MG tablet, TAKE 1 TABLET ONCE DAILY., Disp: 30 tablet, Rfl: 6   clopidogrel (PLAVIX) 75 MG tablet, Take 1 tablet (75 mg total) by mouth daily., Disp: 90 tablet, Rfl: 3   escitalopram (LEXAPRO) 10 MG tablet, TAKE 1 TABLET DAILY IN THE AFTERNOON., Disp: 30 tablet, Rfl: 0   ezetimibe (ZETIA) 10 MG tablet, TAKE 1 TABLET ONCE DAILY., Disp: 30 tablet, Rfl: 3   glipiZIDE-metformin (METAGLIP) 2.5-500 MG tablet, TAKE 2 TABLETS BY MOUTH IN THE MORNING AND 1 TABLET AT NIGHT, Disp: 270 tablet, Rfl: 1   HYDROcodone-acetaminophen (NORCO) 10-325 MG tablet, Take 1 tablet by mouth every 6 (six) hours as needed for severe pain., Disp: 40 tablet, Rfl: 0   hydrocortisone (ANUSOL-HC) 2.5 % rectal cream, Place 1 application rectally 4 (four) times daily as needed (hemorrhoids/discomfort).  (Patient not taking: Reported on 12/23/2021), Disp: , Rfl:    LINZESS 145 MCG CAPS capsule, TAKE 1 CAPSULE DAILY AS NEEDED FOR CONSTIPATION, Disp: 30 capsule, Rfl: 0   losartan (COZAAR) 100 MG tablet, Take 100 mg by mouth daily., Disp: , Rfl:    metoprolol tartrate (LOPRESSOR) 25 MG tablet, Take 0.5 tablets (12.5 mg total) by mouth 2 (two) times daily., Disp: 90 tablet, Rfl: 3   Misc Natural Products (NEURIVA PO), Take 1 tablet by mouth daily in the afternoon., Disp: , Rfl:    nitroGLYCERIN (NITROSTAT) 0.4 MG SL tablet, Place 1 tablet (0.4 mg total) under the tongue every 5 (five) minutes x 3 doses as needed for chest pain. (Patient not taking: Reported on 12/23/2021), Disp: 25 tablet, Rfl: 3   Study - [facility] - selatogrel 16 mg/0.5 mL or placebo SQ injection (PI-Christopher), Inject 0.5 mLs (16 mg total) into the skin once for 1 dose. Inject 0.5 mL subcutaneously in the  abdomen or thigh if you experience  symptoms of a heart attack. Call  911 immediately  and seek emergency medical support., Disp: 1 mL, Rfl: 0   terazosin (HYTRIN) 5 MG capsule, TAKE 1 CAPSULE BY MOUTH DAILY IN THE AFTERNOON., Disp: 30 capsule, Rfl: 3   traZODone (DESYREL) 50 MG tablet, TAKE 1 TABLET BY MOUTH AT BEDTIME., Disp: 30 tablet, Rfl: 0

## 2022-02-23 DIAGNOSIS — E785 Hyperlipidemia, unspecified: Secondary | ICD-10-CM | POA: Diagnosis not present

## 2022-02-23 DIAGNOSIS — E1165 Type 2 diabetes mellitus with hyperglycemia: Secondary | ICD-10-CM | POA: Diagnosis not present

## 2022-02-28 DIAGNOSIS — I2510 Atherosclerotic heart disease of native coronary artery without angina pectoris: Secondary | ICD-10-CM | POA: Diagnosis not present

## 2022-02-28 DIAGNOSIS — Z955 Presence of coronary angioplasty implant and graft: Secondary | ICD-10-CM | POA: Diagnosis not present

## 2022-02-28 DIAGNOSIS — J019 Acute sinusitis, unspecified: Secondary | ICD-10-CM | POA: Diagnosis not present

## 2022-02-28 DIAGNOSIS — K649 Unspecified hemorrhoids: Secondary | ICD-10-CM | POA: Diagnosis not present

## 2022-02-28 DIAGNOSIS — E785 Hyperlipidemia, unspecified: Secondary | ICD-10-CM | POA: Diagnosis not present

## 2022-02-28 DIAGNOSIS — Z0001 Encounter for general adult medical examination with abnormal findings: Secondary | ICD-10-CM | POA: Diagnosis not present

## 2022-02-28 DIAGNOSIS — I10 Essential (primary) hypertension: Secondary | ICD-10-CM | POA: Diagnosis not present

## 2022-02-28 DIAGNOSIS — E1165 Type 2 diabetes mellitus with hyperglycemia: Secondary | ICD-10-CM | POA: Diagnosis not present

## 2022-02-28 DIAGNOSIS — G4700 Insomnia, unspecified: Secondary | ICD-10-CM | POA: Diagnosis not present

## 2022-02-28 DIAGNOSIS — K5904 Chronic idiopathic constipation: Secondary | ICD-10-CM | POA: Diagnosis not present

## 2022-02-28 DIAGNOSIS — I700 Atherosclerosis of aorta: Secondary | ICD-10-CM | POA: Diagnosis not present

## 2022-02-28 DIAGNOSIS — I119 Hypertensive heart disease without heart failure: Secondary | ICD-10-CM | POA: Diagnosis not present

## 2022-05-23 DIAGNOSIS — Z006 Encounter for examination for normal comparison and control in clinical research program: Secondary | ICD-10-CM

## 2022-05-23 NOTE — Research (Signed)
[id number], [facility]     FOLLOW-UP PHONE CALLS  SUBJECT ID:  [id number] Trainer's name: Dutch Quint Trainer's signature: on Delegation of Authority Log Date of the Follow up call:    23-May-2022  Visit Number: 19 Start time of the Follow up call:       1215          End time of call: 1225   - CONTACT   Q1;  Was the follow up phone call done with the subject?    YES    NO           If NO, check the following:      Q2:    Was the follow up phone call done with a family member               Or caregiver?      YES     NO          Make note about reasons ___________________________________    AUTOINJECTOR LABEL:  Still legible?    Yes    No  - MEDICAL CONDITION      Q3:  Did any of the following occur?            Death          Hospitalization (any cause)            Use of autoinjector  Make note about the type of event, and when it occurred. In case of hospitalization: the Location of the hospital and/or the treating physician's contact details  ____________ ____________________________________________________________________ ____________________________________________________________________  Note: remember to report any SAE/AE which has occurred within 30 days after any  injection of the study drug on relevant eCRF forms.   Q4:  Did the subject develop any condition which is an          exclusion criterion?    YES    NO   Take note about the occurrence of any exclusion criterion after randomization: _________________________________________________________________ __________________________________________________________________  Q5: Was there any change in subject's antithrombotic therapy?     YES    NO     Take not about any change of antithrombotic treatment:  _______________________ ____________________________________________________________________ ____________________________________________________________________      Tedra Senegal OF THE STUDY-SPECFIC TRAINING  Q6: Did the subject correctly reply to the following questions?       A.  What are common heart attack symptoms?        YES     NO      B.  What has to be done in case any of those symptoms occurs?   YES    NO      C.  What are the main steps to perform a self-injection?    YES    NO             If NO, then report which step/s was/were missing:          Choose injection site (abdomen or thigh)         Twist cap off         Pinch skin and place the study autoinjector         Firmly push down and hold for 3 seconds       D. What has to be done immediately after an injection?    YES     NO          If NO, then report which step/s was/were missing:       []   Call  for emergency medical help       []   Show the autoinjector to the emergency medical responder       E.  Does the subject recall where s/he keeps/ stores the autoinjectors? [x]  YES  []  NO          Note the place of storage and any corrective explanation if needed below __________________________________________________________________ __________________________________________________________________  - TRAINING REFRESHER   Q7:  Is a training refresher needed?      []  YES  [x]  NO        If YES, indicate items that have to be refreshed. More than one may apply:               []   Heart attack symptoms             []   Actions to be taken following heart attack symptoms             []   Steps to perform the self-injection and follow-up actions to be taken   []   Other, Specify  _________________________________________     Form Based on [facility]_CRF_Version 5.0_27Oct2021 [id number], [id number],            and eCRF  Version 5.0-  03-Dec-2019      Current Outpatient Medications:     acetaminophen (TYLENOL) 500 MG tablet, Take 325 mg by mouth daily., Disp: , Rfl:    ALPRAZolam (XANAX) 0.5 MG tablet, TAKE 1 TABLET IN THE MORNING, AT NOON AND AT BEDTIME., Disp: 90 tablet, Rfl: 0   amLODipine (NORVASC) 2.5 MG tablet, Take 1 tablet (2.5 mg total) by mouth daily., Disp: 90 tablet, Rfl: 3   aspirin EC 81 MG tablet, Take 81 mg by mouth daily., Disp: , Rfl:    atorvastatin (LIPITOR) 80 MG tablet, TAKE 1 TABLET ONCE DAILY., Disp: 30 tablet, Rfl: 6   clopidogrel (PLAVIX) 75 MG tablet, Take 1 tablet (75 mg total) by mouth daily., Disp: 90 tablet, Rfl: 3   escitalopram (LEXAPRO) 10 MG tablet, TAKE 1 TABLET DAILY IN THE AFTERNOON., Disp: 30 tablet, Rfl: 0   ezetimibe (ZETIA) 10 MG tablet, TAKE 1 TABLET ONCE DAILY., Disp: 30 tablet, Rfl: 3   glipiZIDE-metformin (METAGLIP) 2.5-500 MG tablet, TAKE 2 TABLETS BY MOUTH IN THE MORNING AND 1 TABLET AT NIGHT, Disp: 270 tablet, Rfl: 1   HYDROcodone-acetaminophen (NORCO) 10-325 MG tablet, Take 1 tablet by mouth every 6 (six) hours as needed for severe pain., Disp: 40 tablet, Rfl: 0   hydrocortisone (ANUSOL-HC) 2.5 % rectal cream, Place 1 application rectally 4 (four) times daily as needed (hemorrhoids/discomfort).  (Patient not taking: Reported on 12/23/2021), Disp: , Rfl:    LINZESS 145 MCG CAPS capsule, TAKE 1 CAPSULE DAILY AS NEEDED FOR CONSTIPATION, Disp: 30 capsule, Rfl: 0   losartan (COZAAR) 100 MG tablet, Take 100 mg by mouth daily., Disp: , Rfl:    metoprolol tartrate (LOPRESSOR) 25 MG tablet, Take 0.5 tablets (12.5 mg total) by mouth 2 (two) times daily., Disp: 90 tablet, Rfl: 3   Misc Natural Products (NEURIVA PO), Take 1 tablet by mouth daily in the afternoon., Disp: , Rfl:    nitroGLYCERIN (NITROSTAT) 0.4 MG SL tablet, Place 1 tablet (0.4 mg total) under the tongue every 5 (five) minutes x 3 doses as needed for chest pain. (Patient not taking: Reported  on 12/23/2021), Disp: 25 tablet, Rfl: 3   Study - [facility]OS-[facility]I - selatogrel 16 mg/0.5 mL or  placebo SQ injection (PI-Christopher), Inject 0.5 mLs (16 mg total) into the skin once for 1 dose. Inject 0.5 mL subcutaneously in the abdomen or thigh if you experience  symptoms of a heart attack. Call 911 immediately  and seek emergency medical support., Disp: 1 mL, Rfl: 0   terazosin (HYTRIN) 5 MG capsule, TAKE 1 CAPSULE BY MOUTH DAILY IN THE AFTERNOON., Disp: 30 capsule, Rfl: 3   traZODone (DESYREL) 50 MG tablet, TAKE 1 TABLET BY MOUTH AT BEDTIME., Disp: 30 tablet, Rfl: 0

## 2022-06-06 DIAGNOSIS — E1165 Type 2 diabetes mellitus with hyperglycemia: Secondary | ICD-10-CM | POA: Diagnosis not present

## 2022-06-06 DIAGNOSIS — E785 Hyperlipidemia, unspecified: Secondary | ICD-10-CM | POA: Diagnosis not present

## 2022-06-13 DIAGNOSIS — E1165 Type 2 diabetes mellitus with hyperglycemia: Secondary | ICD-10-CM | POA: Diagnosis not present

## 2022-06-13 DIAGNOSIS — Z955 Presence of coronary angioplasty implant and graft: Secondary | ICD-10-CM | POA: Diagnosis not present

## 2022-06-13 DIAGNOSIS — G8929 Other chronic pain: Secondary | ICD-10-CM | POA: Diagnosis not present

## 2022-06-13 DIAGNOSIS — G4700 Insomnia, unspecified: Secondary | ICD-10-CM | POA: Diagnosis not present

## 2022-06-13 DIAGNOSIS — I10 Essential (primary) hypertension: Secondary | ICD-10-CM | POA: Diagnosis not present

## 2022-06-13 DIAGNOSIS — K649 Unspecified hemorrhoids: Secondary | ICD-10-CM | POA: Diagnosis not present

## 2022-06-13 DIAGNOSIS — E785 Hyperlipidemia, unspecified: Secondary | ICD-10-CM | POA: Diagnosis not present

## 2022-06-13 DIAGNOSIS — K5904 Chronic idiopathic constipation: Secondary | ICD-10-CM | POA: Diagnosis not present

## 2022-06-13 DIAGNOSIS — I700 Atherosclerosis of aorta: Secondary | ICD-10-CM | POA: Diagnosis not present

## 2022-06-13 DIAGNOSIS — I2510 Atherosclerotic heart disease of native coronary artery without angina pectoris: Secondary | ICD-10-CM | POA: Diagnosis not present

## 2022-08-15 VITALS — BP 117/60 | HR 65 | Resp 14 | Wt 299.0 lb

## 2022-08-15 DIAGNOSIS — Z006 Encounter for examination for normal comparison and control in clinical research program: Secondary | ICD-10-CM

## 2022-08-15 MED ORDER — STUDY - SOS-AMI - SELATOGREL 16 MG/0.5 ML OR PLACEBO SQ INJECTION (PI-CHRISTOPHER): 16.0000 mg | INJECTION | Freq: Once | SUBCUTANEOUS | 0 refills | Status: DC

## 2022-08-15 NOTE — Research (Cosign Needed Addendum)
[id number], [id number]     FOLLOW-UP   SUBJECT ID:  [id number] Trainer's name: Dutch Quint Trainer's signature: on Delegation of Authority Log Date of the Follow up:    15-Aug-2022  Visit Number: 12  Kits returned: [id number]  New kits received: [id number]   Q1;  Was the follow up done with the subject?  [x]   YES  []   NO           If NO, check the following:      Q2:    Was the follow up done with a family member               Or caregiver?    []   YES   [x]   NO          Make note about reasons ___________________________________    AUTOINJECTOR LABEL:  Still legible?  [x]   Yes  []   No  - MEDICAL CONDITION      Q3:  Did any of the following occur?   []   Death       []   Hospitalization (any cause)         []   Use of autoinjector  Make note about the type of event, and when it occurred. In case of hospitalization: the Location of the hospital and/or the treating physician's contact details  ____________ ____________________________________________________________________ ____________________________________________________________________  Note: remember to report any SAE/AE which has occurred within 30 days after any  injection of the study drug on relevant eCRF forms.   Q4:  Did the subject develop any condition which is an          exclusion criterion?  []   YES  [x]   NO   Take note about the occurrence of any exclusion criterion after randomization: _________________________________________________________________ __________________________________________________________________  Q5: Was there any change in subject's antithrombotic therapy?   []   YES  [x]   NO     Take not about any change of antithrombotic treatment: _______________________ ____________________________________________________________________ ____________________________________________________________________      Tedra [place] OF THE STUDY-SPECFIC TRAINING  Q6:  Did the subject correctly reply to the following questions?       A.  What are common heart attack symptoms?      [x]   YES   []   NO      B.  What has to be done in case any of those symptoms occurs? [x]   YES  []   NO      C.  What are the main steps to perform a self-injection?  [x]   YES  []   NO             If NO, then report which step/s was/were missing:        []   Choose injection site (abdomen or thigh)       []   Twist cap off       []   Pinch skin and place the study autoinjector       []   Firmly push down and hold for 3 seconds       D. What has to be done immediately after an injection?  [x]   YES   []   NO          If NO, then report which step/s was/were missing:       []   Call  for emergency medical help       []   Show the autoinjector to the emergency medical responder       E.  Does the subject recall where s/he keeps/ stores the autoinjectors? [x]  YES  []  NO          Note the place of storage and any corrective explanation if needed below __________________________________________________________________ __________________________________________________________________  - TRAINING REFRESHER   Q7:  Is a training refresher needed?      []  YES  [x]  NO        If YES, indicate items that have to be refreshed. More than one may apply:               []   Heart attack symptoms             []   Actions to be taken following heart attack symptoms             []   Steps to perform the self-injection and follow-up actions to be taken   []   Other, Specify  _________________________________________      Subject was notified of extended study duration per SOS memo.  Current Outpatient Medications:    acetaminophen (TYLENOL) 500 MG tablet, Take 325 mg by mouth daily., Disp: , Rfl:    ALPRAZolam (XANAX) 0.5 MG tablet, TAKE 1 TABLET IN THE MORNING, AT NOON AND AT BEDTIME., Disp: 90 tablet, Rfl: 0   amLODipine (NORVASC) 2.5 MG tablet, Take 1 tablet (2.5 mg total) by mouth daily., Disp: 90  tablet, Rfl: 3   aspirin EC 81 MG tablet, Take 81 mg by mouth daily., Disp: , Rfl:    atorvastatin (LIPITOR) 80 MG tablet, TAKE 1 TABLET ONCE DAILY., Disp: 30 tablet, Rfl: 6   clopidogrel (PLAVIX) 75 MG tablet, Take 1 tablet (75 mg total) by mouth daily., Disp: 90 tablet, Rfl: 3   escitalopram (LEXAPRO) 10 MG tablet, TAKE 1 TABLET DAILY IN THE AFTERNOON., Disp: 30 tablet, Rfl: 0   ezetimibe (ZETIA) 10 MG tablet, TAKE 1 TABLET ONCE DAILY., Disp: 30 tablet, Rfl: 3   glipiZIDE-metformin (METAGLIP) 2.5-500 MG tablet, TAKE 2 TABLETS BY MOUTH IN THE MORNING AND 1 TABLET AT NIGHT, Disp: 270 tablet, Rfl: 1   HYDROcodone-acetaminophen (NORCO) 10-325 MG tablet, Take 1 tablet by mouth every 6 (six) hours as needed for severe pain., Disp: 40 tablet, Rfl: 0   hydrocortisone (ANUSOL-HC) 2.5 % rectal cream, Place 1 application  rectally 4 (four) times daily as needed (hemorrhoids/discomfort)., Disp: , Rfl:    LINZESS 145 MCG CAPS capsule, TAKE 1 CAPSULE DAILY AS NEEDED FOR CONSTIPATION, Disp: 30 capsule, Rfl: 0   losartan (COZAAR) 100 MG tablet, Take 100 mg by mouth daily., Disp: , Rfl:    metoprolol tartrate (LOPRESSOR) 25 MG tablet, Take 0.5 tablets (12.5 mg total) by mouth 2 (two) times daily., Disp: 90 tablet, Rfl: 3   Misc Natural Products (NEURIVA PO), Take 1 tablet by mouth daily in the afternoon., Disp: , Rfl:    nitroGLYCERIN (NITROSTAT) 0.4 MG SL tablet, Place 1 tablet (0.4 mg total) under the tongue every 5 (five) minutes x 3 doses as needed for chest pain., Disp: 25 tablet, Rfl: 3   Study - SOS-AMI - selatogrel 16 mg/0.5 mL or placebo SQ injection (PI-Christopher), Inject 0.5 mLs (16 mg total) into the skin once for 1 dose. Inject 0.5 mL subcutaneously in the abdomen or thigh if you experience  symptoms of a heart attack. Call 911 immediately  and seek emergency medical support. Contact [facility] for any questions or concerns regarding  this medication., Disp: 1 mL, Rfl: 0   terazosin (HYTRIN) 5  MG capsule, TAKE 1 CAPSULE BY MOUTH DAILY IN THE AFTERNOON., Disp: 30 capsule, Rfl: 3   traZODone (DESYREL) 50 MG tablet, TAKE 1 TABLET BY MOUTH AT BEDTIME., Disp: 30 tablet, Rfl: 0

## 2022-09-13 DIAGNOSIS — J019 Acute sinusitis, unspecified: Secondary | ICD-10-CM | POA: Diagnosis not present

## 2022-09-13 DIAGNOSIS — Z87891 Personal history of nicotine dependence: Secondary | ICD-10-CM | POA: Diagnosis not present

## 2022-09-20 DIAGNOSIS — E1165 Type 2 diabetes mellitus with hyperglycemia: Secondary | ICD-10-CM | POA: Diagnosis not present

## 2022-09-20 DIAGNOSIS — E785 Hyperlipidemia, unspecified: Secondary | ICD-10-CM | POA: Diagnosis not present

## 2022-10-03 DIAGNOSIS — G4700 Insomnia, unspecified: Secondary | ICD-10-CM | POA: Diagnosis not present

## 2022-10-03 DIAGNOSIS — E1165 Type 2 diabetes mellitus with hyperglycemia: Secondary | ICD-10-CM | POA: Diagnosis not present

## 2022-10-03 DIAGNOSIS — K649 Unspecified hemorrhoids: Secondary | ICD-10-CM | POA: Diagnosis not present

## 2022-10-03 DIAGNOSIS — I700 Atherosclerosis of aorta: Secondary | ICD-10-CM | POA: Diagnosis not present

## 2022-10-03 DIAGNOSIS — E785 Hyperlipidemia, unspecified: Secondary | ICD-10-CM | POA: Diagnosis not present

## 2022-10-03 DIAGNOSIS — I2510 Atherosclerotic heart disease of native coronary artery without angina pectoris: Secondary | ICD-10-CM | POA: Diagnosis not present

## 2022-10-03 DIAGNOSIS — Z955 Presence of coronary angioplasty implant and graft: Secondary | ICD-10-CM | POA: Diagnosis not present

## 2022-10-03 DIAGNOSIS — I10 Essential (primary) hypertension: Secondary | ICD-10-CM | POA: Diagnosis not present

## 2022-10-03 DIAGNOSIS — K5904 Chronic idiopathic constipation: Secondary | ICD-10-CM | POA: Diagnosis not present

## 2022-10-03 DIAGNOSIS — I119 Hypertensive heart disease without heart failure: Secondary | ICD-10-CM | POA: Diagnosis not present

## 2022-10-24 ENCOUNTER — Other Ambulatory Visit: Payer: Self-pay | Admitting: Cardiovascular Disease

## 2022-11-28 DIAGNOSIS — Z006 Encounter for examination for normal comparison and control in clinical research program: Secondary | ICD-10-CM

## 2022-11-28 NOTE — Research (Signed)
[id number], SOS-AMI     FOLLOW-UP PHONE CALLS  SUBJECT ID:  [id number] Trainer's name: Dutch Quint Trainer's signature: on Delegation of Authority Log Date of the Follow up call:     28-Nov-2022  Visit Number: 56 Start time of the Follow up call:   1030              End time of call: 1045     Q1;  Was the follow up phone call done with the subject?  [x]   YES  []   NO           If NO, check the following:      Q2:    Was the follow up phone call done with a family member               Or caregiver?    []   YES   [x]   NO          Make note about reasons ___________________________________    AUTOINJECTOR LABEL:  Still legible?  [x]   Yes  []   No  - MEDICAL CONDITION      Q3:  Did any of the following occur?   []   Death       []   Hospitalization (any cause)         []   Use of autoinjector  Make note about the type of event, and when it occurred. In case of hospitalization: the Location of the hospital and/or the treating physician's contact details  ____________ ____________________________________________________________________ ____________________________________________________________________  Note: remember to report any SAE/AE which has occurred within 30 days after any  injection of the study drug on relevant eCRF forms.   Q4:  Did the subject develop any condition which is an          exclusion criterion?  []   YES  [x]   NO   Take note about the occurrence of any exclusion criterion after randomization:   Q5: Was there any change in subject's antithrombotic therapy?   []   YES  [x]   NO     Take note about any change of antithrombotic treatment:  - RECOLLECTION OF THE STUDY-SPECFIC TRAINING  Q6: Did the subject correctly reply to the following questions?       A.  What are common heart attack symptoms?      [x]   YES   []   NO      B.  What has to be done in case any of those symptoms occurs? [x]   YES  []   NO      C.  What are the main steps to  perform a self-injection?  [x]   YES  []   NO             If NO, then report which step/s was/were missing:        []   Choose injection site (abdomen or thigh)       []   Twist cap off       []   Pinch skin and place the study autoinjector       []   Firmly push down and hold for 3 seconds       D. What has to be done immediately after an injection?  [x]   YES   []   NO          If NO, then report which step/s was/were missing:       []   Call  for emergency medical help       []   Show the autoinjector to the emergency medical responder       E.  Does the subject recall where s/he keeps/ stores the autoinjectors? [x]  YES  []  NO          Note the place of storage and any corrective explanation if needed below __________________________________________________________________ __________________________________________________________________  - TRAINING REFRESHER   Q7:  Is a training refresher needed?      []  YES  [x]  NO        If YES, indicate items that have to be refreshed. More than one may apply:               []   Heart attack symptoms             []   Actions to be taken following heart attack symptoms             []   Steps to perform the self-injection and follow-up actions to be taken   []   Other, Specify  _________________________________________        Current Outpatient Medications:    acetaminophen (TYLENOL) 500 MG tablet, Take 325 mg by mouth daily., Disp: , Rfl:    ALPRAZolam (XANAX) 0.5 MG tablet, TAKE 1 TABLET IN THE MORNING, AT NOON AND AT BEDTIME., Disp: 90 tablet, Rfl: 0   amLODipine (NORVASC) 2.5 MG tablet, Take 1 tablet (2.5 mg total) by mouth daily., Disp: 90 tablet, Rfl: 3   aspirin EC 81 MG tablet, Take 81 mg by mouth daily., Disp: , Rfl:    atorvastatin (LIPITOR) 80 MG tablet, TAKE 1 TABLET ONCE DAILY., Disp: 30 tablet, Rfl: 6   clopidogrel (PLAVIX) 75 MG tablet, Take 1 tablet (75 mg total) by mouth daily., Disp: 90 tablet, Rfl: 3   escitalopram (LEXAPRO) 10 MG  tablet, TAKE 1 TABLET DAILY IN THE AFTERNOON., Disp: 30 tablet, Rfl: 0   ezetimibe (ZETIA) 10 MG tablet, TAKE 1 TABLET ONCE DAILY., Disp: 30 tablet, Rfl: 3   glipiZIDE-metformin (METAGLIP) 2.5-500 MG tablet, TAKE 2 TABLETS BY MOUTH IN THE MORNING AND 1 TABLET AT NIGHT, Disp: 270 tablet, Rfl: 1   HYDROcodone-acetaminophen (NORCO) 10-325 MG tablet, Take 1 tablet by mouth every 6 (six) hours as needed for severe pain., Disp: 40 tablet, Rfl: 0   hydrocortisone (ANUSOL-HC) 2.5 % rectal cream, Place 1 application  rectally 4 (four) times daily as needed (hemorrhoids/discomfort)., Disp: , Rfl:    LINZESS 145 MCG CAPS capsule, TAKE 1 CAPSULE DAILY AS NEEDED FOR CONSTIPATION, Disp: 30 capsule, Rfl: 0   losartan (COZAAR) 100 MG tablet, Take 100 mg by mouth daily., Disp: , Rfl:    metoprolol tartrate (LOPRESSOR) 25 MG tablet, Take 0.5 tablets (12.5 mg total) by mouth 2 (two) times daily., Disp: 90 tablet, Rfl: 3   Misc Natural Products (NEURIVA PO), Take 1 tablet by mouth daily in the afternoon., Disp: , Rfl:    nitroGLYCERIN (NITROSTAT) 0.4 MG SL tablet, DISSOLVE 1 TABLET UNDER THE TONGUE EVERY 5 MINUTES AS NEEDED FOR CHEST PAINS FOR A MAX OF 3 DOSES. CALL 911 AFTER 3 DOSES., Disp: 25 tablet, Rfl: 6   Study - SOS-AMI - selatogrel 16 mg/0.5 mL or placebo SQ injection (PI-Christopher), Inject 0.5 mLs (16 mg total) into the skin once for 1 dose. Inject 0.5 mL subcutaneously in the abdomen or thigh if you experience  symptoms of a heart attack. Call 911 immediately  and seek emergency medical support. Contact [facility] for any questions or concerns regarding this medication., Disp: 1 mL,  Rfl: 0   terazosin (HYTRIN) 5 MG capsule, TAKE 1 CAPSULE BY MOUTH DAILY IN THE AFTERNOON., Disp: 30 capsule, Rfl: 3   traZODone (DESYREL) 50 MG tablet, TAKE 1 TABLET BY MOUTH AT BEDTIME., Disp: 30 tablet, Rfl: 0

## 2022-12-04 NOTE — Progress Notes (Unsigned)
Cardiology Office Note   Date:  12/06/2022   ID:  Steven Stevens, DOB Jun 14, 1945, MRN [id number]  PCP:  Benita Stabile, MD  Cardiologist:   Dietrich Pates, MD   F/U of CAD     History of Present Illness: Steven Stevens is a 77 y.o. male with a history of CAD He is s/p PTCA/ DES to RCA in 2005; s/p PTCA/DES to RCA in 2017; s/p PCI with DES to CTO  in March 2017)  Also he has a hstory of COPD, morbid obesity, HL and type II DM  In 2022 admitted with NSTEMI   Underwent cath with PTCA/Stent to LAD       I saw the pt in Nov 2023  Since seen he says he has done OK from a cardiac standpoint   His breathing is OK (except for current sinus issues).  He denies CP    He was using Mounjaro and lost 25lbs  Can't afford    Has occasional LE edema    Current Meds  Medication Sig   acetaminophen (TYLENOL) 500 MG tablet Take 325 mg by mouth daily.   ALPRAZolam (XANAX) 0.5 MG tablet TAKE 1 TABLET IN THE MORNING, AT NOON AND AT BEDTIME.   amLODipine (NORVASC) 2.5 MG tablet Take 1 tablet (2.5 mg total) by mouth daily.   aspirin EC 81 MG tablet Take 81 mg by mouth daily.   atorvastatin (LIPITOR) 80 MG tablet TAKE 1 TABLET ONCE DAILY.   clopidogrel (PLAVIX) 75 MG tablet Take 1 tablet (75 mg total) by mouth daily.   escitalopram (LEXAPRO) 10 MG tablet TAKE 1 TABLET DAILY IN THE AFTERNOON.   ezetimibe (ZETIA) 10 MG tablet TAKE 1 TABLET ONCE DAILY.   glipiZIDE-metformin (METAGLIP) 2.5-500 MG tablet TAKE 2 TABLETS BY MOUTH IN THE MORNING AND 1 TABLET AT NIGHT   HYDROcodone-acetaminophen (NORCO) 10-325 MG tablet Take 1 tablet by mouth every 6 (six) hours as needed for severe pain.   hydrocortisone (ANUSOL-HC) 2.5 % rectal cream Place 1 application  rectally 4 (four) times daily as needed (hemorrhoids/discomfort).   LINZESS 145 MCG CAPS capsule TAKE 1 CAPSULE DAILY AS NEEDED FOR CONSTIPATION   losartan (COZAAR) 100 MG tablet Take 100 mg by mouth daily.   metoprolol tartrate (LOPRESSOR) 25 MG tablet Take 0.5  tablets (12.5 mg total) by mouth 2 (two) times daily.   Misc Natural Products (NEURIVA PO) Take 1 tablet by mouth daily in the afternoon.   nitroGLYCERIN (NITROSTAT) 0.4 MG SL tablet DISSOLVE 1 TABLET UNDER THE TONGUE EVERY 5 MINUTES AS NEEDED FOR CHEST PAINS FOR A MAX OF 3 DOSES. CALL 911 AFTER 3 DOSES.   Study - SOS-AMI - selatogrel 16 mg/0.5 mL or placebo SQ injection (PI-Christopher) Inject 0.5 mLs (16 mg total) into the skin once for 1 dose. Inject 0.5 mL subcutaneously in the abdomen or thigh if you experience  symptoms of a heart attack. Call 911 immediately  and seek emergency medical support. Contact [facility] for any questions or concerns regarding this medication.   terazosin (HYTRIN) 5 MG capsule TAKE 1 CAPSULE BY MOUTH DAILY IN THE AFTERNOON.   traZODone (DESYREL) 50 MG tablet TAKE 1 TABLET BY MOUTH AT BEDTIME.     Allergies:   Synjardy xr [empagliflozin-metformin hcl er]   Past Medical History:  Diagnosis Date   Anxiety    Chronic lower back pain    Coronary artery disease    DES to RCA 2005, DES to RCA 2017, DES  x2 to CTO LAD 2017   Depression    Hyperlipidemia    Hypertension    Myocardial infarct (HCC) 01/27/2004   Type 2 diabetes mellitus (HCC)    Type II diabetes mellitus (HCC)     Past Surgical History:  Procedure Laterality Date   BACK SURGERY     CARDIAC CATHETERIZATION N/A 03/30/2015   Procedure: Left Heart Cath and Coronary Angiography;  Surgeon: Corky Crafts, MD;  Location: [facility] INVASIVE CV LAB;  Service: Cardiovascular;  Laterality: N/A;   CARDIAC CATHETERIZATION  03/30/2015   Procedure: Coronary Stent Intervention;  Surgeon: Corky Crafts, MD;  Location: [facility] INVASIVE CV LAB;  Service: Cardiovascular;;   CARDIAC CATHETERIZATION N/A 04/14/2015   Procedure: Coronary/Bypass Graft CTO Intervention;  Surgeon: Corky Crafts, MD;  Location[facility] INVASIVE CV LAB;  Service: Cardiovascular;  Laterality: N/A;   CARDIAC CATHETERIZATION  04/14/2015    Procedure: Left Heart Cath and Coronary Angiography;  Surgeon: Corky Crafts, MD;  Location: [facility] INVASIVE CV LAB;  Service: Cardiovascular;;   CATARACT EXTRACTION W/PHACO Right 12/03/2012   Procedure: CATARACT EXTRACTION RIGHT EYE (WITH PHACO) AND INTRAOCULAR LENS PLACEMENT  CDE=2.41;  Surgeon: Loraine Leriche T. Nile Riggs, MD;  Location: [facility];  Service: Ophthalmology;  Laterality: Right;   CATARACT EXTRACTION W/PHACO Left 12/17/2012   Procedure: CATARACT EXTRACTION PHACO AND INTRAOCULAR LENS PLACEMENT (IOC);  Surgeon: Loraine Leriche T. Nile Riggs, MD;  Location: [facility];  Service: Ophthalmology;  Laterality: Left;  CDE:7.82   COLON SURGERY N/A    Phreesia 01/11/2020   COLONOSCOPY  2011   Dr. Jena Gauss: diverticulosis, tubular adenomas   COLONOSCOPY WITH PROPOFOL N/A 07/24/2016   Rourk: 3 tubular adenomas removed.  Diverticulosis.  3-year surveillance colonoscopy recommended.   COLONOSCOPY WITH PROPOFOL N/A 12/15/2019   Procedure: COLONOSCOPY WITH PROPOFOL;  Surgeon: Corbin Ade, MD;  Location: [facility];  Service: Endoscopy;  Laterality: N/A;  8:30am   CORONARY ANGIOPLASTY WITH STENT PLACEMENT  2005   CORONARY IMAGING/OCT N/A 04/12/2020   Procedure: INTRAVASCULAR IMAGING/OCT;  Surgeon: Tonny Bollman, MD;  Location: [facility] INVASIVE CV LAB;  Service: Cardiovascular;  Laterality: N/A;   CORONARY STENT INTERVENTION N/A 04/12/2020   Procedure: CORONARY STENT INTERVENTION;  Surgeon: Tonny Bollman, MD;  Location: [facility] INVASIVE CV LAB;  Service: Cardiovascular;  Laterality: N/A;   CORONARY STENT PLACEMENT  03/30/2015   RCA    DES   EYE SURGERY N/A    Phreesia 01/11/2020   HERNIA REPAIR     KNEE ARTHROSCOPY Left 2012   LEFT HEART CATH AND CORONARY ANGIOGRAPHY N/A 04/12/2020   Procedure: LEFT HEART CATH AND CORONARY ANGIOGRAPHY;  Surgeon: Tonny Bollman, MD;  Location: [facility] INVASIVE CV LAB;  Service: Cardiovascular;  Laterality: N/A;   LUMBAR DISC SURGERY  1995   "herniated disc"   POLYPECTOMY  07/24/2016   Procedure:  POLYPECTOMY;  Surgeon: Corbin Ade, MD;  Location: [facility];  Service: Endoscopy;;  ascending colon polyp, splenic flexure polyps times 2   POLYPECTOMY  12/15/2019   Procedure: POLYPECTOMY;  Surgeon: Corbin Ade, MD;  Location: [facility];  Service: Endoscopy;;   UMBILICAL HERNIA REPAIR  1990s   YAG LASER APPLICATION Right 01/27/2014   Procedure: YAG LASER APPLICATION;  Surgeon: Loraine Leriche T. Nile Riggs, MD;  Location: [facility];  Service: Ophthalmology;  Laterality: Right;     Social History:  The patient  reports that he quit smoking about 18 years ago. His smoking use included cigarettes. He started smoking about 28 years ago. He  has a 1 pack-year smoking history. He has never used smokeless tobacco. He reports that he does not drink alcohol and does not use drugs.     Family History:  The patient's family history includes Colon cancer in his mother.    ROS:  Please see the history of present illness. All other systems are reviewed and  Negative to the above problem except as noted.    PHYSICAL EXAM: VS:  BP 132/74   Pulse 64   Ht 5' 8.5" (1.74 m)   Wt 298 lb 4.8 oz (135.3 kg)   SpO2 98%   BMI 44.70 kg/m   GEN:  Mobridly obese 77 yo in no acute distress   (today is his birthday) HEENT: normal  Neck: no JVD, no carotid bruit Cardiac: RRR; no murmur Tr  LE  edema    Respiratory:  clear to auscultation bilaterally GI: Obese  nontender   MS: no deformity Moving all extremities     EKG:  EKG is ordered today.    SR   rSR1 V1    CATH   March 2022   CARDIAC CATH: 04/12/2020 1. Patent stents in the LAD and RCA with mild diffuse in-stent restneosis 2. Severe eccentric stenosis of the proximal LAD, treated with PCI using a 4.0x22 mm Resolute Onyx DES under OCT guidance.  3. Moderately elevated LVEDP   Recommend: ASA and clopidogrel at least 12 months without interruption, aggressive medical therapy. Favor long-term DAPT if tolerated. OK for discharge tomorrow if no  complicating features.  Left Anterior Descending  The vessel exhibits minimal luminal irregularities.  Collaterals  Dist LAD filled by collaterals from 1st Diag.     Ost LAD to Prox LAD lesion is 90% stenosed. The lesion is eccentric.  Mid LAD to Dist LAD lesion is 25% stenosed. The lesion was previously treated using a drug eluting stent over 2 years ago. Mild diffuse ISR without any flow-limiting lesions identified.  Previously placed Dist LAD drug eluting stent is widely patent.  Left Circumflex  The vessel exhibits minimal luminal irregularities.  Right Coronary Artery  The vessel exhibits minimal luminal irregularities.  Dist RCA-1 lesion is 25% stenosed. The lesion was previously treatedover 2 years ago.  Non-stenotic Dist RCA-2 lesion was previously treated. The lesion is type C and located at the major branch.  Intervention     Lipid Panel    Component Value Date/Time   CHOL 104 01/20/2021 1356   TRIG 71 01/20/2021 1356   HDL 44 01/20/2021 1356   CHOLHDL 3.7 04/12/2020 1744   VLDL 23 04/12/2020 1744   LDLCALC 45 01/20/2021 1356      Wt Readings from Last 3 Encounters:  12/06/22 298 lb 4.8 oz (135.3 kg)  08/15/22 299 lb (135.6 kg)  12/23/21 299 lb 3.2 oz (135.7 kg)      ASSESSMENT AND PLAN:  1  CAD No symptoms of angina      2  HL  Last lipids in Aug 2024 LDL was 41, HDL was 41  Trig 79  Keep on same regimen   3  HTN BP is controlled  Continue current meds   WIll give lasix 40 mg to take only as needed (at most 1x per week) for LE edema    4  DM  A1C in Aug 2024 was 6.3    Will work on getting GLP1 agonist approved given DM and CAD     Follow up Sept 2025   Current medicines are reviewed  at length with the patient today.  The patient does not have concerns regarding medicines.  Signed, Dietrich Pates, MD  12/06/2022 2:18 PM    [facility] [address] [place], [place], [place]  [address] Phone: [phone]; Fax: [phone]

## 2022-12-06 ENCOUNTER — Ambulatory Visit: Payer: Medicare Other | Attending: Internal Medicine | Admitting: Internal Medicine

## 2022-12-06 ENCOUNTER — Encounter: Payer: Self-pay | Admitting: Internal Medicine

## 2022-12-06 VITALS — BP 132/74 | HR 64 | Ht 68.5 in | Wt 298.3 lb

## 2022-12-06 DIAGNOSIS — I2510 Atherosclerotic heart disease of native coronary artery without angina pectoris: Secondary | ICD-10-CM | POA: Diagnosis not present

## 2022-12-06 DIAGNOSIS — Z9861 Coronary angioplasty status: Secondary | ICD-10-CM

## 2022-12-06 DIAGNOSIS — I10 Essential (primary) hypertension: Secondary | ICD-10-CM | POA: Diagnosis not present

## 2022-12-06 MED ORDER — FUROSEMIDE 40 MG PO TABS: 40.0000 mg | ORAL_TABLET | ORAL | 0 refills | Status: DC | PRN

## 2022-12-06 NOTE — Patient Instructions (Addendum)
Medication Instructions:  Your physician recommends that you continue on your current medications as directed. Please refer to the Current Medication list given to you today.  Take Lasix 40 mg weekly as needed   *If you need a refill on your cardiac medications before your next appointment, please call your pharmacy*   Lab Work: NONE   If you have labs (blood work) drawn today and your tests are completely normal, you will receive your results only by: MyChart Message (if you have MyChart) OR A paper copy in the mail If you have any lab test that is abnormal or we need to change your treatment, we will call you to review the results.   Testing/Procedures: NONE    Follow-Up: At [facility], you and your health needs are our priority.  As part of our continuing mission to provide you with exceptional heart care, we have created designated Provider Care Teams.  These Care Teams include your primary Cardiologist (physician) and Advanced Practice Providers (APPs -  Physician Assistants and Nurse Practitioners) who all work together to provide you with the care you need, when you need it.  We recommend signing up for the patient portal called "MyChart".  Sign up information is provided on this After Visit Summary.  MyChart is used to connect with patients for Virtual Visits (Telemedicine).  Patients are able to view lab/test results, encounter notes, upcoming appointments, etc.  Non-urgent messages can be sent to your provider as well.   To learn more about what you can do with MyChart, go to [email or web address].    Your next appointment:   September   Provider:   You may see Dietrich Pates, MD or one of the following Advanced Practice Providers on your designated Care Team:   Randall An, PA-C  Jacolyn Reedy, PA-C     Other Instructions Thank you for choosing [facility]!

## 2023-01-09 ENCOUNTER — Other Ambulatory Visit: Payer: Self-pay | Admitting: Internal Medicine

## 2023-01-10 ENCOUNTER — Telehealth: Payer: Self-pay | Admitting: Pharmacist

## 2023-01-10 ENCOUNTER — Ambulatory Visit: Payer: Medicare Other | Attending: Cardiovascular Disease | Admitting: Pharmacist

## 2023-01-10 ENCOUNTER — Encounter: Payer: Self-pay | Admitting: Pharmacist

## 2023-01-10 VITALS — Ht 68.0 in | Wt 298.0 lb

## 2023-01-10 DIAGNOSIS — E119 Type 2 diabetes mellitus without complications: Secondary | ICD-10-CM

## 2023-01-10 DIAGNOSIS — I214 Non-ST elevation (NSTEMI) myocardial infarction: Secondary | ICD-10-CM

## 2023-01-10 MED ORDER — METFORMIN HCL 500 MG PO TABS: 500.0000 mg | ORAL_TABLET | Freq: Two times a day (BID) | ORAL | 5 refills | Status: AC

## 2023-01-10 NOTE — Progress Notes (Signed)
Patient ID: Steven Stevens                 DOB: 1945-11-11                    MRN: [id number]     HPI: Steven Stevens is a 77 y.o. male patient referred to pharmacy clinic by Dr Tenny Craw to initiate GLP1-RA therapy. PMH is significant for CAD, NSTEMI, T2DM, and obesity. Most recent BMI 45.32.  Patient presents today with daughter. Daughter cooks majority of meals for him and tries to cook heart healthy however he prefers foods such as fried chicken.  Currently managed on glipizide-metformin for T2DM. Was previously on [facility] which helped lower his blood sugar and he lost weight until copay increased.   On atorvastatin, ezetimibe, clopidogrel, and metoprolol for CAD. Also enrolled in selatogrel study.  Labs: Lab Results  Component Value Date   HGBA1C 6.9 (H) 01/20/2021    Wt Readings from Last 1 Encounters:  12/06/22 298 lb 4.8 oz (135.3 kg)    BP Readings from Last 1 Encounters:  12/06/22 132/74   Pulse Readings from Last 1 Encounters:  12/06/22 64       Component Value Date/Time   CHOL 104 01/20/2021 1356   TRIG 71 01/20/2021 1356   HDL 44 01/20/2021 1356   CHOLHDL 3.7 04/12/2020 1744   VLDL 23 04/12/2020 1744   LDLCALC 45 01/20/2021 1356    Past Medical History:  Diagnosis Date   Anxiety    Chronic lower back pain    Coronary artery disease    DES to RCA 2005, DES to RCA 2017, DES x2 to CTO LAD 2017   Depression    Hyperlipidemia    Hypertension    Myocardial infarct (HCC) 01/27/2004   Type 2 diabetes mellitus (HCC)    Type II diabetes mellitus (HCC)     Current Outpatient Medications on File Prior to Visit  Medication Sig Dispense Refill   acetaminophen (TYLENOL) 500 MG tablet Take 325 mg by mouth daily.     ALPRAZolam (XANAX) 0.5 MG tablet TAKE 1 TABLET IN THE MORNING, AT NOON AND AT BEDTIME. 90 tablet 0   amLODipine (NORVASC) 2.5 MG tablet Take 1 tablet (2.5 mg total) by mouth daily. 90 tablet 3   aspirin EC 81 MG tablet Take 81 mg by mouth daily.      atorvastatin (LIPITOR) 80 MG tablet TAKE 1 TABLET ONCE DAILY. 30 tablet 6   clopidogrel (PLAVIX) 75 MG tablet Take 1 tablet (75 mg total) by mouth daily. 90 tablet 3   escitalopram (LEXAPRO) 10 MG tablet TAKE 1 TABLET DAILY IN THE AFTERNOON. 30 tablet 0   ezetimibe (ZETIA) 10 MG tablet TAKE 1 TABLET ONCE DAILY. 30 tablet 3   furosemide (LASIX) 40 MG tablet take 1 tablet (40 MILLIGRAM total) by mouth as needed for edema. 90 tablet 2   glipiZIDE-metformin (METAGLIP) 2.5-500 MG tablet TAKE 2 TABLETS BY MOUTH IN THE MORNING AND 1 TABLET AT NIGHT 270 tablet 1   HYDROcodone-acetaminophen (NORCO) 10-325 MG tablet Take 1 tablet by mouth every 6 (six) hours as needed for severe pain. 40 tablet 0   hydrocortisone (ANUSOL-HC) 2.5 % rectal cream Place 1 application  rectally 4 (four) times daily as needed (hemorrhoids/discomfort).     LINZESS 145 MCG CAPS capsule TAKE 1 CAPSULE DAILY AS NEEDED FOR CONSTIPATION 30 capsule 0   losartan (COZAAR) 100 MG tablet Take 100 mg by mouth daily.  metoprolol tartrate (LOPRESSOR) 25 MG tablet Take 0.5 tablets (12.5 mg total) by mouth 2 (two) times daily. 90 tablet 3   Misc Natural Products (NEURIVA PO) Take 1 tablet by mouth daily in the afternoon.     nitroGLYCERIN (NITROSTAT) 0.4 MG SL tablet DISSOLVE 1 TABLET UNDER THE TONGUE EVERY 5 MINUTES AS NEEDED FOR CHEST PAINS FOR A MAX OF 3 DOSES. CALL 911 AFTER 3 DOSES. 25 tablet 6   Study - [facility] - selatogrel 16 mg/0.5 mL or placebo SQ injection (PI-Aunesty Tyson) Inject 0.5 mLs (16 mg total) into the skin once for 1 dose. Inject 0.5 mL subcutaneously in the abdomen or thigh if you experience  symptoms of a heart attack. Call 911 immediately  and seek emergency medical support. Contact [facility] for any questions or concerns regarding this medication. 1 mL 0   terazosin (HYTRIN) 5 MG capsule TAKE 1 CAPSULE BY MOUTH DAILY IN THE AFTERNOON. 30 capsule 3   traZODone (DESYREL) 50 MG tablet TAKE 1 TABLET BY MOUTH AT  BEDTIME. 30 tablet 0   No current facility-administered medications on file prior to visit.    Allergies  Allergen Reactions   Synjardy Xr [Empagliflozin-Metformin Hcl Er] Other (See Comments)    Itching/burning urination     Assessment/Plan:  1. Weight loss/ T2DM - Patient last A1c 6.9% which is at goal of <7.0%. However patient has been gaining weight. Recommended stopping glipzide-metformin combination pill and just using metformin. Advised side effects were hypoglycemia and weight gain.    Would benefit from GLP1a due to T2DM, CAD, and obesity. Is familiar with Greggory Keen and how to administer. Taught patient how to use Ozempic pen in room. Will complete PA for both to see which is covered. Confirmed no personal or family history of medullary thyroid carcinoma (MTC) or Multiple Endocrine Neoplasia syndrome type 2 (MEN 2). Injection technique reviewed at today's visit.  Advised patient on common side effects including nausea, diarrhea, dyspepsia, decreased appetite, and fatigue. Counseled patient on reducing meal size and how to titrate medication to minimize side effects. Counseled patient to call if intolerable side effects or if experiencing dehydration, abdominal pain, or dizziness. Patient will adhere to dietary modifications .  Laural Golden, PharmD, BCACP, CDCES, CPP [address], [address] [place], [place], [address] Phone: [phone], Fax: [phone]

## 2023-01-10 NOTE — Telephone Encounter (Signed)
Please complete PA for Ozempic and/or [employer]

## 2023-01-10 NOTE — Patient Instructions (Addendum)
It was nice meeting you two today  The medications we discussed today are called Ozempic and Mounjaro. Both are given once a week  I will complete the prior authorizations for you and contact you with the response  Try to focus on vegetables, lean proteins, and whole grains   Please let me know if you have any questions  Laural Golden, PharmD, BCACP, CDCES, CPP [address] [place], [place], [address] Phone: [phone], Fax: [phone]

## 2023-01-11 ENCOUNTER — Other Ambulatory Visit (HOSPITAL_COMMUNITY): Payer: Self-pay

## 2023-01-11 ENCOUNTER — Telehealth: Payer: Self-pay | Admitting: Pharmacy Technician

## 2023-01-11 DIAGNOSIS — E1165 Type 2 diabetes mellitus with hyperglycemia: Secondary | ICD-10-CM | POA: Diagnosis not present

## 2023-01-11 DIAGNOSIS — E785 Hyperlipidemia, unspecified: Secondary | ICD-10-CM | POA: Diagnosis not present

## 2023-01-11 NOTE — Telephone Encounter (Signed)
Pharmacy Patient Advocate Encounter   Received notification from Pt Calls Messages that prior authorization for mounjaro is required/requested.   Insurance verification completed.   The patient is insured through  [employer]  .   Per test claim: The current 01/11/23 day co-pay is, $268.80- one month.  No PA needed at this time. This test claim was processed through [facility]- copay amounts may vary at other pharmacies due to pharmacy/plan contracts, or as the patient moves through the different stages of their insurance plan.

## 2023-01-11 NOTE — Telephone Encounter (Signed)
Pharmacy Patient Advocate Encounter   Received notification from Pt Calls Messages that prior authorization for ozempic is required/requested.   Insurance verification completed.   The patient is insured through  [employer]  .   Per test claim: The current 01/11/23 day co-pay is, $243.53- one month [facility]).  No PA needed at this time. This test claim was processed through [facility]- copay amounts may vary at other pharmacies due to pharmacy/plan contracts, or as the patient moves through the different stages of their insurance plan.

## 2023-01-17 DIAGNOSIS — I10 Essential (primary) hypertension: Secondary | ICD-10-CM | POA: Diagnosis not present

## 2023-01-17 DIAGNOSIS — K649 Unspecified hemorrhoids: Secondary | ICD-10-CM | POA: Diagnosis not present

## 2023-01-17 DIAGNOSIS — E785 Hyperlipidemia, unspecified: Secondary | ICD-10-CM | POA: Diagnosis not present

## 2023-01-17 DIAGNOSIS — E1165 Type 2 diabetes mellitus with hyperglycemia: Secondary | ICD-10-CM | POA: Diagnosis not present

## 2023-01-17 DIAGNOSIS — I700 Atherosclerosis of aorta: Secondary | ICD-10-CM | POA: Diagnosis not present

## 2023-01-17 DIAGNOSIS — Z23 Encounter for immunization: Secondary | ICD-10-CM | POA: Diagnosis not present

## 2023-01-17 DIAGNOSIS — I2510 Atherosclerotic heart disease of native coronary artery without angina pectoris: Secondary | ICD-10-CM | POA: Diagnosis not present

## 2023-01-17 DIAGNOSIS — K5904 Chronic idiopathic constipation: Secondary | ICD-10-CM | POA: Diagnosis not present

## 2023-01-17 DIAGNOSIS — Z955 Presence of coronary angioplasty implant and graft: Secondary | ICD-10-CM | POA: Diagnosis not present

## 2023-02-14 ENCOUNTER — Encounter: Payer: Self-pay | Admitting: Pharmacist

## 2023-02-15 ENCOUNTER — Telehealth: Payer: Self-pay

## 2023-02-15 ENCOUNTER — Other Ambulatory Visit (HOSPITAL_COMMUNITY): Payer: Self-pay

## 2023-02-15 ENCOUNTER — Telehealth: Payer: Self-pay | Admitting: Pharmacist

## 2023-02-15 NOTE — Telephone Encounter (Signed)
 New Encounter created for follow up. For additional info see Pharmacy Prior Auth telephone encounters from 02/15/23.

## 2023-02-15 NOTE — Telephone Encounter (Addendum)
 Pharmacy Patient Advocate Encounter   Received notification from Physician's Office that prior authorization for OZEMPIC is required/requested.   Insurance verification completed.   The patient is insured through  [employer]  .   Per test claim: The current 28 day co-pay is, $410.94 (363.94 TOWARDS DEDUCTIBLE, COPAY WILL BE $47 AFTER DEDUCTIBLE HAS BEEN MET).  No PA needed at this time. This test claim was processed through [facility]- copay amounts may vary at other pharmacies due to pharmacy/plan contracts, or as the patient moves through the different stages of their insurance plan.

## 2023-02-15 NOTE — Telephone Encounter (Signed)
 Pharmacy Patient Advocate Encounter   Received notification from Physician's Office that prior authorization for MOUNJARO is required/requested.   Insurance verification completed.   The patient is insured through  [employer]  .   Per test claim: The current 28 day co-pay is, $410.94 ($363.94 OF PT COST IS GOING TOWARDS DEDUCTIBLE, COPAY SHOULD BE $47 AFTER DEDUCTIBLE HAS BEEN MET .  No PA needed at this time. This test claim was processed through [facility]- copay amounts may vary at other pharmacies due to pharmacy/plan contracts, or as the patient moves through the different stages of their insurance plan.

## 2023-02-15 NOTE — Telephone Encounter (Signed)
 Please complete PA for Ozempic and [employer]

## 2023-02-26 DIAGNOSIS — Z006 Encounter for examination for normal comparison and control in clinical research program: Secondary | ICD-10-CM

## 2023-02-26 NOTE — Research (Signed)
[id number], [facility]     FOLLOW-UP PHONE CALLS  SUBJECT ID:  [id number] Trainer's name: Dutch Quint Trainer's signature: on Delegation of Authority Log Date of the Follow up call: 26-Feb-2023  Visit Number:  Start time of the Follow up call:  0950               End time of call: 1000   Q1;  Was the follow up phone call done with the subject?  [x]   YES  []   NO           If NO, check the following:      Q2:    Was the follow up phone call done with a family member               Or caregiver?    []   YES   [x]   NO          Make note about reasons ___________________________________    AUTOINJECTOR LABEL:  Still legible?  [x]   Yes  []   No  - MEDICAL CONDITION      Q3:  Did any of the following occur?   []   Death       []   Hospitalization (any cause)         []   Use of autoinjector  Make note about the type of event, and when it occurred. In case of hospitalization: the Location of the hospital and/or the treating physician's contact details  ____________ ____________________________________________________________________ ____________________________________________________________________   Q4:  Did the subject develop any condition which is an          exclusion criterion?  []   YES  [x]   NO   Take note about the occurrence of any exclusion criterion after randomization: _________________________________________________________________ __________________________________________________________________  Q5: Was there any change in subject's antithrombotic therapy?   []   YES  [x]   NO     Take note about any change of antithrombotic treatment: _______________________ ____________________________________________________________________ ____________________________________________________________________      Tedra [place] OF THE STUDY-SPECFIC TRAINING  Q6: Did the subject correctly reply to the following questions?       A.  What are common heart attack  symptoms?      [x]   YES   []   NO      B.  What has to be done in case any of those symptoms occurs? [x]   YES  []   NO      C.  What are the main steps to perform a self-injection?  [x]   YES  []   NO             If NO, then report which step/s was/were missing:        []   Choose injection site (abdomen or thigh)       []   Twist cap off       []   Pinch skin and place the study autoinjector       []   Firmly push down and hold for 3 seconds       D. What has to be done immediately after an injection?  [x]   YES   []   NO          If NO, then report which step/s was/were missing:       []   Call  for emergency medical help       []   Show the autoinjector to the emergency medical responder       E.  Does the subject recall where s/he keeps/  stores the autoinjectors? [x]  YES  []  NO          Note the place of storage and any corrective explanation if needed below __________________________________________________________________ __________________________________________________________________  - TRAINING REFRESHER   Q7:  Is a training refresher needed?      []  YES  []  NO        If YES, indicate items that have to be refreshed. More than one may apply:               []   Heart attack symptoms             []   Actions to be taken following heart attack symptoms             []   Steps to perform the self-injection and follow-up actions to be taken   []   Other, Specify  _________________________________________         Current Outpatient Medications:    acetaminophen (TYLENOL) 500 MG tablet, Take 325 mg by mouth daily., Disp: , Rfl:    ALPRAZolam (XANAX) 0.5 MG tablet, TAKE 1 TABLET IN THE MORNING, AT NOON AND AT BEDTIME., Disp: 90 tablet, Rfl: 0   amLODipine (NORVASC) 5 MG tablet, Take 5 mg by mouth daily., Disp: , Rfl:    aspirin EC 81 MG tablet, Take 81 mg by mouth daily., Disp: , Rfl:    atorvastatin (LIPITOR) 80 MG tablet, TAKE 1 TABLET ONCE DAILY., Disp: 30 tablet, Rfl: 6   azelastine  (ASTELIN) 0.1 % nasal spray, Place 1 spray into both nostrils 2 (two) times daily., Disp: , Rfl:    clopidogrel (PLAVIX) 75 MG tablet, Take 1 tablet (75 mg total) by mouth daily., Disp: 90 tablet, Rfl: 3   escitalopram (LEXAPRO) 10 MG tablet, TAKE 1 TABLET DAILY IN THE AFTERNOON., Disp: 30 tablet, Rfl: 0   ezetimibe (ZETIA) 10 MG tablet, TAKE 1 TABLET ONCE DAILY., Disp: 30 tablet, Rfl: 3   furosemide (LASIX) 40 MG tablet, take 1 tablet (40 MILLIGRAM total) by mouth as needed for edema., Disp: 90 tablet, Rfl: 2   HYDROcodone-acetaminophen (NORCO) 10-325 MG tablet, Take 1 tablet by mouth every 6 (six) hours as needed for severe pain., Disp: 40 tablet, Rfl: 0   hydrocortisone (ANUSOL-HC) 2.5 % rectal cream, Place 1 application  rectally 4 (four) times daily as needed (hemorrhoids/discomfort)., Disp: , Rfl:    LINZESS 145 MCG CAPS capsule, TAKE 1 CAPSULE DAILY AS NEEDED FOR CONSTIPATION, Disp: 30 capsule, Rfl: 0   losartan (COZAAR) 100 MG tablet, Take 100 mg by mouth daily., Disp: , Rfl:    metFORMIN (GLUCOPHAGE) 500 MG tablet, Take 1 tablet (500 mg total) by mouth 2 (two) times daily with a meal., Disp: 60 tablet, Rfl: 5   metoprolol tartrate (LOPRESSOR) 25 MG tablet, Take 0.5 tablets (12.5 mg total) by mouth 2 (two) times daily., Disp: 90 tablet, Rfl: 3   Misc Natural Products (NEURIVA PO), Take 1 tablet by mouth daily in the afternoon., Disp: , Rfl:    nitroGLYCERIN (NITROSTAT) 0.4 MG SL tablet, DISSOLVE 1 TABLET UNDER THE TONGUE EVERY 5 MINUTES AS NEEDED FOR CHEST PAINS FOR A MAX OF 3 DOSES. CALL 911 AFTER 3 DOSES., Disp: 25 tablet, Rfl: 6   Study - [facility] - selatogrel 16 mg/0.5 mL or placebo SQ injection (PI-Christopher), Inject 0.5 mLs (16 mg total) into the skin once for 1 dose. Inject 0.5 mL subcutaneously in the abdomen or thigh if you experience  symptoms of a heart attack. Call  911 immediately  and seek emergency medical support. Contact [facility] for any questions or concerns regarding  this medication., Disp: 1 mL, Rfl: 0   terazosin (HYTRIN) 5 MG capsule, TAKE 1 CAPSULE BY MOUTH DAILY IN THE AFTERNOON., Disp: 30 capsule, Rfl: 3   traZODone (DESYREL) 50 MG tablet, TAKE 1 TABLET BY MOUTH AT BEDTIME., Disp: 30 tablet, Rfl: 0

## 2023-03-08 DIAGNOSIS — J019 Acute sinusitis, unspecified: Secondary | ICD-10-CM | POA: Diagnosis not present

## 2023-04-24 DIAGNOSIS — E785 Hyperlipidemia, unspecified: Secondary | ICD-10-CM | POA: Diagnosis not present

## 2023-04-24 DIAGNOSIS — E1165 Type 2 diabetes mellitus with hyperglycemia: Secondary | ICD-10-CM | POA: Diagnosis not present

## 2023-04-30 DIAGNOSIS — K649 Unspecified hemorrhoids: Secondary | ICD-10-CM | POA: Diagnosis not present

## 2023-04-30 DIAGNOSIS — I2510 Atherosclerotic heart disease of native coronary artery without angina pectoris: Secondary | ICD-10-CM | POA: Diagnosis not present

## 2023-04-30 DIAGNOSIS — G8929 Other chronic pain: Secondary | ICD-10-CM | POA: Diagnosis not present

## 2023-04-30 DIAGNOSIS — Z955 Presence of coronary angioplasty implant and graft: Secondary | ICD-10-CM | POA: Diagnosis not present

## 2023-04-30 DIAGNOSIS — E785 Hyperlipidemia, unspecified: Secondary | ICD-10-CM | POA: Diagnosis not present

## 2023-04-30 DIAGNOSIS — K5904 Chronic idiopathic constipation: Secondary | ICD-10-CM | POA: Diagnosis not present

## 2023-04-30 DIAGNOSIS — I10 Essential (primary) hypertension: Secondary | ICD-10-CM | POA: Diagnosis not present

## 2023-04-30 DIAGNOSIS — E1165 Type 2 diabetes mellitus with hyperglycemia: Secondary | ICD-10-CM | POA: Diagnosis not present

## 2023-04-30 DIAGNOSIS — G4700 Insomnia, unspecified: Secondary | ICD-10-CM | POA: Diagnosis not present

## 2023-05-15 ENCOUNTER — Encounter

## 2023-05-15 VITALS — BP 148/75 | HR 54 | Temp 98.8°F | Resp 18

## 2023-05-15 DIAGNOSIS — Z006 Encounter for examination for normal comparison and control in clinical research program: Secondary | ICD-10-CM

## 2023-05-15 MED ORDER — STUDY - SOS-AMI - SELATOGREL 16 MG/0.5 ML OR PLACEBO SQ INJECTION (PI-CHRISTOPHER): 16.0000 mg | INJECTION | Freq: Once | SUBCUTANEOUS | 0 refills | Status: DC

## 2023-05-15 NOTE — Research (Signed)
 [facility]     FOLLOW-UP and KIT EXCHANGE  SUBJECT ID:  [id number] Trainer's name: Fredericka Bottcher Trainer's signature: on Delegation of Authority Log Date of the Follow up:   15-May-2023  Visit Number:  15   Kits returned: [id number]  Kits received: [phone] [id number]    Q1;  Was the follow up done with the subject?  [x]   YES  []   NO           If NO, check the following:      Q2:    Was the follow up done with a family member               Or caregiver?    [x]   YES   []   NO          Make note about reasons ___________________________________    AUTOINJECTOR LABEL:  Still legible?  [x]   Yes  []   No     Q3:  Did any of the following occur?   []   Death       []   Hospitalization (any cause)         []   Use of autoinjector  Make note about the type of event, and when it occurred. In case of hospitalization: the Location of the hospital and/or the treating physician's contact details  ____________ ____________________________________________________________________ ____________________________________________________________________    Q4:  Did the subject develop any condition which is an          exclusion criterion?  []   YES  [x]   NO   Take note about the occurrence of any exclusion criterion after randomization: _________________________________________________________________ __________________________________________________________________  Q5: Was there any change in subject's antithrombotic therapy?   []   YES  [x]   NO     Take note about any change of antithrombotic treatment: _______________________ ____________________________________________________________________ ____________________________________________________________________      Tedra [place] OF THE STUDY-SPECFIC TRAINING  Q6: Did the subject correctly reply to the following questions?       A.  What are common heart attack symptoms?      [x]   YES   []   NO      B.  What  has to be done in case any of those symptoms occurs? [x]   YES  []   NO      C.  What are the main steps to perform a self-injection?  [x]   YES  []   NO             If NO, then report which step/s was/were missing:        []   Choose injection site (abdomen or thigh)       []   Twist cap off       []   Pinch skin and place the study autoinjector       []   Firmly push down and hold for 3 seconds       D. What has to be done immediately after an injection?  [x]   YES   []   NO          If NO, then report which step/s was/were missing:       []   Call  for emergency medical help       []   Show the autoinjector to the emergency medical responder       E.  Does the subject recall where s/he keeps/ stores the autoinjectors? [x]  YES  []  NO          Note the place of storage  and any corrective explanation if needed below __________________________________________________________________ __________________________________________________________________  TRAINING REFRESHER   Q7:  Is a training refresher needed?      []  YES  [x]  NO        If YES, indicate items that have to be refreshed. More than one may apply:               []   Heart attack symptoms             []   Actions to be taken following heart attack symptoms             []   Steps to perform the self-injection and follow-up actions to be taken   []   Other, Specify  _________________________________________       Current Outpatient Medications:    acetaminophen (TYLENOL) 500 MG tablet, Take 325 mg by mouth daily., Disp: , Rfl:    ALPRAZolam (XANAX) 0.5 MG tablet, TAKE 1 TABLET IN THE MORNING, AT NOON AND AT BEDTIME., Disp: 90 tablet, Rfl: 0   aspirin EC 81 MG tablet, Take 81 mg by mouth daily., Disp: , Rfl:    atorvastatin (LIPITOR) 80 MG tablet, TAKE 1 TABLET ONCE DAILY., Disp: 30 tablet, Rfl: 6   azelastine (ASTELIN) 0.1 % nasal spray, Place 1 spray into both nostrils 2 (two) times daily., Disp: , Rfl:    clopidogrel (PLAVIX) 75 MG  tablet, Take 1 tablet (75 mg total) by mouth daily., Disp: 90 tablet, Rfl: 3   escitalopram (LEXAPRO) 10 MG tablet, TAKE 1 TABLET DAILY IN THE AFTERNOON., Disp: 30 tablet, Rfl: 0   ezetimibe (ZETIA) 10 MG tablet, TAKE 1 TABLET ONCE DAILY., Disp: 30 tablet, Rfl: 3   furosemide (LASIX) 40 MG tablet, take 1 tablet (40 MILLIGRAM total) by mouth as needed for edema., Disp: 90 tablet, Rfl: 2   HYDROcodone-acetaminophen (NORCO) 10-325 MG tablet, Take 1 tablet by mouth every 6 (six) hours as needed for severe pain., Disp: 40 tablet, Rfl: 0   hydrocortisone (ANUSOL-HC) 2.5 % rectal cream, Place 1 application  rectally 4 (four) times daily as needed (hemorrhoids/discomfort)., Disp: , Rfl:    LINZESS 145 MCG CAPS capsule, TAKE 1 CAPSULE DAILY AS NEEDED FOR CONSTIPATION, Disp: 30 capsule, Rfl: 0   losartan (COZAAR) 100 MG tablet, Take 100 mg by mouth daily., Disp: , Rfl:    metoprolol tartrate (LOPRESSOR) 25 MG tablet, Take 0.5 tablets (12.5 mg total) by mouth 2 (two) times daily., Disp: 90 tablet, Rfl: 3   Misc Natural Products (NEURIVA PO), Take 1 tablet by mouth daily in the afternoon., Disp: , Rfl:    nitroGLYCERIN (NITROSTAT) 0.4 MG SL tablet, DISSOLVE 1 TABLET UNDER THE TONGUE EVERY 5 MINUTES AS NEEDED FOR CHEST PAINS FOR A MAX OF 3 DOSES. CALL 911 AFTER 3 DOSES., Disp: 25 tablet, Rfl: 6   terazosin (HYTRIN) 5 MG capsule, TAKE 1 CAPSULE BY MOUTH DAILY IN THE AFTERNOON., Disp: 30 capsule, Rfl: 3   traZODone (DESYREL) 50 MG tablet, TAKE 1 TABLET BY MOUTH AT BEDTIME., Disp: 30 tablet, Rfl: 0   amLODipine (NORVASC) 5 MG tablet, Take 5 mg by mouth daily. (Patient not taking: Reported on 05/15/2023), Disp: , Rfl:    metFORMIN (GLUCOPHAGE) 500 MG tablet, Take 1 tablet (500 mg total) by mouth 2 (two) times daily with a meal. (Patient not taking: Reported on 05/15/2023), Disp: 60 tablet, Rfl: 5   Study - [facility] - selatogrel 16 mg/0.5 mL or placebo SQ injection (PI-Christopher), Inject 0.5 mLs (16 mg  total) into the  skin once for 1 dose. Inject 0.5 mL subcutaneously in the abdomen or thigh if you experience  symptoms of a heart attack. Call 911 immediately  and seek emergency medical support. Contact [facility] for any questions or concerns regarding this medication., Disp: 1 mL, Rfl: 0

## 2023-08-03 DIAGNOSIS — E1165 Type 2 diabetes mellitus with hyperglycemia: Secondary | ICD-10-CM | POA: Diagnosis not present

## 2023-08-03 DIAGNOSIS — E785 Hyperlipidemia, unspecified: Secondary | ICD-10-CM | POA: Diagnosis not present

## 2023-08-09 DIAGNOSIS — I10 Essential (primary) hypertension: Secondary | ICD-10-CM | POA: Diagnosis not present

## 2023-08-09 DIAGNOSIS — I2510 Atherosclerotic heart disease of native coronary artery without angina pectoris: Secondary | ICD-10-CM | POA: Diagnosis not present

## 2023-08-09 DIAGNOSIS — E785 Hyperlipidemia, unspecified: Secondary | ICD-10-CM | POA: Diagnosis not present

## 2023-08-09 DIAGNOSIS — K649 Unspecified hemorrhoids: Secondary | ICD-10-CM | POA: Diagnosis not present

## 2023-08-09 DIAGNOSIS — R1013 Epigastric pain: Secondary | ICD-10-CM | POA: Diagnosis not present

## 2023-08-09 DIAGNOSIS — Z955 Presence of coronary angioplasty implant and graft: Secondary | ICD-10-CM | POA: Diagnosis not present

## 2023-08-09 DIAGNOSIS — E1165 Type 2 diabetes mellitus with hyperglycemia: Secondary | ICD-10-CM | POA: Diagnosis not present

## 2023-08-09 DIAGNOSIS — I700 Atherosclerosis of aorta: Secondary | ICD-10-CM | POA: Diagnosis not present

## 2023-08-09 DIAGNOSIS — K5904 Chronic idiopathic constipation: Secondary | ICD-10-CM | POA: Diagnosis not present

## 2023-08-09 DIAGNOSIS — G4700 Insomnia, unspecified: Secondary | ICD-10-CM | POA: Diagnosis not present

## 2023-08-09 DIAGNOSIS — I119 Hypertensive heart disease without heart failure: Secondary | ICD-10-CM | POA: Diagnosis not present

## 2023-08-27 DIAGNOSIS — Z006 Encounter for examination for normal comparison and control in clinical research program: Secondary | ICD-10-CM

## 2023-08-27 NOTE — Research (Signed)
 [facility]     FOLLOW-UP PHONE CALLS  SUBJECT ID:  [id number] Trainer's name: Kerstyn Coryell Trainer's signature: on Delegation of Authority Log Date of the Follow up call:  27-Aug-2023  Visit Number: 40 Start time of the Follow up call:     1245            End time of call: 1300    Q1;  Was the follow up phone call done with the subject?  [x]   YES  []   NO           If NO, check the following:      Q2:    Was the follow up phone call done with a family member               Or caregiver?    []   YES   [x]   NO          Make note about reasons ___________________________________    AUTOINJECTOR LABEL:  Still legible?  [x]   Yes  []   No     Q3:  Did any of the following occur?   []   Death       []   Hospitalization (any cause)         []   Use of autoinjector  Make note about the type of event, and when it occurred. In case of hospitalization: the Location of the hospital and/or the treating physician's contact details  ____________ ____________________________________________________________________ ____________________________________________________________________    Q4:  Did the subject develop any condition which is an          exclusion criterion?  []   YES  [x]   NO   Take note about the occurrence of any exclusion criterion after randomization: _________________________________________________________________ __________________________________________________________________  Q5: Was there any change in subject's antithrombotic therapy?   []   YES  [x]   NO     Take note about any change of antithrombotic treatment: _______________________ ____________________________________________________________________ ____________________________________________________________________      SHARYLE OF THE STUDY-SPECFIC TRAINING  Q6: Did the subject correctly reply to the following questions?       A.  What are common heart attack symptoms?      [x]   YES    []   NO      B.  What has to be done in case any of those symptoms occurs? [x]   YES  []   NO      C.  What are the main steps to perform a self-injection?  [x]   YES  []   NO             If NO, then report which step/s was/were missing:        []   Choose injection site (abdomen or thigh)       []   Twist cap off       []   Pinch skin and place the study autoinjector       []   Firmly push down and hold for 3 seconds       D. What has to be done immediately after an injection?  [x]   YES   []   NO          If NO, then report which step/s was/were missing:       []   Call  for emergency medical help       []   Show the autoinjector to the emergency medical responder       E.  Does the subject recall where s/he keeps/ stores the autoinjectors? [  x] YES  []  NO          Note the place of storage and any corrective explanation if needed below __________________________________________________________________ __________________________________________________________________  TRAINING REFRESHER   Q7:  Is a training refresher needed?      []  YES  [x]  NO        If YES, indicate items that have to be refreshed. More than one may apply:               []   Heart attack symptoms             []   Actions to be taken following heart attack symptoms             []   Steps to perform the self-injection and follow-up actions to be taken   []   Other, Specify  _________________________________________        Current Outpatient Medications:    acetaminophen  (TYLENOL ) 500 MG tablet, Take 325 mg by mouth daily., Disp: , Rfl:    ALPRAZolam  (XANAX ) 0.5 MG tablet, TAKE 1 TABLET IN THE MORNING, AT NOON AND AT BEDTIME., Disp: 90 tablet, Rfl: 0   amLODipine  (NORVASC ) 5 MG tablet, Take 5 mg by mouth daily. (Patient not taking: Reported on 05/15/2023), Disp: , Rfl:    aspirin  EC 81 MG tablet, Take 81 mg by mouth daily., Disp: , Rfl:    atorvastatin  (LIPITOR) 80 MG tablet, TAKE 1 TABLET ONCE DAILY., Disp: 30 tablet, Rfl:  6   azelastine (ASTELIN) 0.1 % nasal spray, Place 1 spray into both nostrils 2 (two) times daily., Disp: , Rfl:    clopidogrel  (PLAVIX ) 75 MG tablet, Take 1 tablet (75 mg total) by mouth daily., Disp: 90 tablet, Rfl: 3   escitalopram  (LEXAPRO ) 10 MG tablet, TAKE 1 TABLET DAILY IN THE AFTERNOON., Disp: 30 tablet, Rfl: 0   ezetimibe  (ZETIA ) 10 MG tablet, TAKE 1 TABLET ONCE DAILY., Disp: 30 tablet, Rfl: 3   furosemide  (LASIX ) 40 MG tablet, take 1 tablet (40 MILLIGRAM total) by mouth as needed for edema., Disp: 90 tablet, Rfl: 2   HYDROcodone -acetaminophen  (NORCO) 10-325 MG tablet, Take 1 tablet by mouth every 6 (six) hours as needed for severe pain., Disp: 40 tablet, Rfl: 0   hydrocortisone (ANUSOL-HC) 2.5 % rectal cream, Place 1 application  rectally 4 (four) times daily as needed (hemorrhoids/discomfort)., Disp: , Rfl:    LINZESS  145 MCG CAPS capsule, TAKE 1 CAPSULE DAILY AS NEEDED FOR CONSTIPATION, Disp: 30 capsule, Rfl: 0   losartan  (COZAAR ) 100 MG tablet, Take 100 mg by mouth daily., Disp: , Rfl:    metFORMIN  (GLUCOPHAGE ) 500 MG tablet, Take 1 tablet (500 mg total) by mouth 2 (two) times daily with a meal. (Patient not taking: Reported on 05/15/2023), Disp: 60 tablet, Rfl: 5   metoprolol  tartrate (LOPRESSOR ) 25 MG tablet, Take 0.5 tablets (12.5 mg total) by mouth 2 (two) times daily., Disp: 90 tablet, Rfl: 3   Misc Natural Products (NEURIVA PO), Take 1 tablet by mouth daily in the afternoon., Disp: , Rfl:    nitroGLYCERIN  (NITROSTAT ) 0.4 MG SL tablet, DISSOLVE 1 TABLET UNDER THE TONGUE EVERY 5 MINUTES AS NEEDED FOR CHEST PAINS FOR A MAX OF 3 DOSES. CALL 911 AFTER 3 DOSES., Disp: 25 tablet, Rfl: 6   Study - [facility] - selatogrel 16 mg/0.5 mL or placebo SQ injection ([facility]), Inject 0.5 mLs (16 mg total) into the skin once for 1 dose. Inject 0.5 mL subcutaneously in the abdomen or thigh if you experience  symptoms of a heart attack. Call 911 immediately  and seek emergency medical support. Contact  [facility] for any questions or concerns regarding this medication., Disp: 1 mL, Rfl: 0   terazosin  (HYTRIN ) 5 MG capsule, TAKE 1 CAPSULE BY MOUTH DAILY IN THE AFTERNOON., Disp: 30 capsule, Rfl: 3   traZODone  (DESYREL ) 50 MG tablet, TAKE 1 TABLET BY MOUTH AT BEDTIME., Disp: 30 tablet, Rfl: 0

## 2023-11-04 ENCOUNTER — Other Ambulatory Visit: Payer: Self-pay | Admitting: Internal Medicine

## 2023-11-29 DIAGNOSIS — E785 Hyperlipidemia, unspecified: Secondary | ICD-10-CM | POA: Diagnosis not present

## 2023-11-29 DIAGNOSIS — E1165 Type 2 diabetes mellitus with hyperglycemia: Secondary | ICD-10-CM | POA: Diagnosis not present

## 2023-12-03 ENCOUNTER — Other Ambulatory Visit: Payer: Self-pay | Admitting: Internal Medicine

## 2023-12-04 DIAGNOSIS — Z006 Encounter for examination for normal comparison and control in clinical research program: Secondary | ICD-10-CM

## 2023-12-04 NOTE — Research (Signed)
 [facility]     FOLLOW-UP PHONE CALLS  SUBJECT ID:  [id number] Trainer's name: Trenice Mesa Trainer's signature: on Delegation of Authority Log Date of the Follow up call:  04-Dec-2023  Visit Number: 17 Start time of the Follow up call:    1200             End time of call: 1210    Q1;  Was the follow up phone call done with the subject?  [x]   YES  []   NO           If NO, check the following:      Q2:    Was the follow up phone call done with a family member               Or caregiver?    []   YES   [x]   NO          Make note about reasons ___________________________________    AUTOINJECTOR LABEL:  Still legible?  [x]   Yes  []   No     Q3:  Did any of the following occur?   []   Death       []   Hospitalization (any cause)         []   Use of autoinjector  Make note about the type of event, and when it occurred. In case of hospitalization: the Location of the hospital and/or the treating physician's contact details  ____________ ____________________________________________________________________ ____________________________________________________________________    Q4:  Did the subject develop any condition which is an          exclusion criterion?  []   YES  [x]   NO   Take note about the occurrence of any exclusion criterion after randomization: _________________________________________________________________ __________________________________________________________________  Q5: Was there any change in subject's antithrombotic therapy?   []   YES  [x]   NO     Take note about any change of antithrombotic treatment: _______________________ ____________________________________________________________________ ____________________________________________________________________      SHARYLE OF THE STUDY-SPECFIC TRAINING  Q6: Did the subject correctly reply to the following questions?       A.  What are common heart attack symptoms?      [x]   YES    []   NO      B.  What has to be done in case any of those symptoms occurs? [x]   YES  []   NO      C.  What are the main steps to perform a self-injection?  [x]   YES  []   NO             If NO, then report which step/s was/were missing:        []   Choose injection site (abdomen or thigh)       []   Twist cap off       []   Pinch skin and place the study autoinjector       []   Firmly push down and hold for 3 seconds       D. What has to be done immediately after an injection?  [x]   YES   []   NO          If NO, then report which step/s was/were missing:       []   Call  for emergency medical help       []   Show the autoinjector to the emergency medical responder       E.  Does the subject recall where s/he keeps/ stores the autoinjectors? [  x] YES  []  NO          Note the place of storage and any corrective explanation if needed below __________________________________________________________________ __________________________________________________________________  TRAINING REFRESHER   Q7:  Is a training refresher needed?      []  YES  [x]  NO        If YES, indicate items that have to be refreshed. More than one may apply:               []   Heart attack symptoms             []   Actions to be taken following heart attack symptoms             []   Steps to perform the self-injection and follow-up actions to be taken   []   Other, Specify  _________________________________________        Current Outpatient Medications:    acetaminophen  (TYLENOL ) 500 MG tablet, Take 325 mg by mouth daily., Disp: , Rfl:    ALPRAZolam  (XANAX ) 0.5 MG tablet, TAKE 1 TABLET IN THE MORNING, AT NOON AND AT BEDTIME., Disp: 90 tablet, Rfl: 0   aspirin  EC 81 MG tablet, Take 81 mg by mouth daily., Disp: , Rfl:    atorvastatin  (LIPITOR) 80 MG tablet, TAKE 1 TABLET ONCE DAILY., Disp: 30 tablet, Rfl: 6   azelastine (ASTELIN) 0.1 % nasal spray, Place 1 spray into both nostrils 2 (two) times daily., Disp: , Rfl:     clopidogrel  (PLAVIX ) 75 MG tablet, Take 1 tablet (75 mg total) by mouth daily., Disp: 90 tablet, Rfl: 3   escitalopram  (LEXAPRO ) 10 MG tablet, TAKE 1 TABLET DAILY IN THE AFTERNOON., Disp: 30 tablet, Rfl: 0   ezetimibe  (ZETIA ) 10 MG tablet, TAKE 1 TABLET ONCE DAILY., Disp: 30 tablet, Rfl: 3   furosemide  (LASIX ) 40 MG tablet, take 1 tablet (40 milligram total) by mouth as needed for edema., Disp: 30 tablet, Rfl: 0   HYDROcodone -acetaminophen  (NORCO) 10-325 MG tablet, Take 1 tablet by mouth every 6 (six) hours as needed for severe pain., Disp: 40 tablet, Rfl: 0   hydrocortisone (ANUSOL-HC) 2.5 % rectal cream, Place 1 application  rectally 4 (four) times daily as needed (hemorrhoids/discomfort)., Disp: , Rfl:    LINZESS  145 MCG CAPS capsule, TAKE 1 CAPSULE DAILY AS NEEDED FOR CONSTIPATION, Disp: 30 capsule, Rfl: 0   losartan  (COZAAR ) 100 MG tablet, Take 100 mg by mouth daily., Disp: , Rfl:    metoprolol  tartrate (LOPRESSOR ) 25 MG tablet, Take 0.5 tablets (12.5 mg total) by mouth 2 (two) times daily., Disp: 90 tablet, Rfl: 3   Misc Natural Products (NEURIVA PO), Take 1 tablet by mouth daily in the afternoon., Disp: , Rfl:    nitroGLYCERIN  (NITROSTAT ) 0.4 MG SL tablet, DISSOLVE 1 TABLET UNDER THE TONGUE EVERY 5 MINUTES AS NEEDED FOR CHEST PAINS FOR A MAX OF 3 DOSES. CALL 911 AFTER 3 DOSES., Disp: 25 tablet, Rfl: 6   Study - [facility] - selatogrel 16 mg/0.5 mL or placebo SQ injection ([facility]), Inject 0.5 mLs (16 mg total) into the skin once for 1 dose. Inject 0.5 mL subcutaneously in the abdomen or thigh if you experience  symptoms of a heart attack. Call 911 immediately  and seek emergency medical support. Contact [facility] for any questions or concerns regarding this medication., Disp: 1 mL, Rfl: 0   terazosin  (HYTRIN ) 5 MG capsule, TAKE 1 CAPSULE BY MOUTH DAILY IN THE AFTERNOON., Disp: 30 capsule, Rfl: 3   traZODone  (DESYREL )  50 MG tablet, TAKE 1 TABLET BY MOUTH AT BEDTIME., Disp: 30 tablet,  Rfl: 0   amLODipine  (NORVASC ) 5 MG tablet, Take 5 mg by mouth daily. (Patient not taking: Reported on 12/04/2023), Disp: , Rfl:    metFORMIN  (GLUCOPHAGE ) 500 MG tablet, Take 1 tablet (500 mg total) by mouth 2 (two) times daily with a meal. (Patient not taking: Reported on 12/04/2023), Disp: 60 tablet, Rfl: 5

## 2023-12-06 DIAGNOSIS — G8929 Other chronic pain: Secondary | ICD-10-CM | POA: Diagnosis not present

## 2023-12-06 DIAGNOSIS — G4700 Insomnia, unspecified: Secondary | ICD-10-CM | POA: Diagnosis not present

## 2023-12-06 DIAGNOSIS — Z23 Encounter for immunization: Secondary | ICD-10-CM | POA: Diagnosis not present

## 2023-12-06 DIAGNOSIS — E785 Hyperlipidemia, unspecified: Secondary | ICD-10-CM | POA: Diagnosis not present

## 2023-12-06 DIAGNOSIS — Z0001 Encounter for general adult medical examination with abnormal findings: Secondary | ICD-10-CM | POA: Diagnosis not present

## 2023-12-06 DIAGNOSIS — Z0000 Encounter for general adult medical examination without abnormal findings: Secondary | ICD-10-CM | POA: Diagnosis not present

## 2023-12-06 DIAGNOSIS — Z955 Presence of coronary angioplasty implant and graft: Secondary | ICD-10-CM | POA: Diagnosis not present

## 2023-12-06 DIAGNOSIS — I10 Essential (primary) hypertension: Secondary | ICD-10-CM | POA: Diagnosis not present

## 2023-12-06 DIAGNOSIS — M545 Low back pain, unspecified: Secondary | ICD-10-CM | POA: Diagnosis not present

## 2023-12-06 DIAGNOSIS — E1165 Type 2 diabetes mellitus with hyperglycemia: Secondary | ICD-10-CM | POA: Diagnosis not present

## 2024-01-02 ENCOUNTER — Other Ambulatory Visit: Payer: Self-pay | Admitting: Internal Medicine

## 2024-02-03 ENCOUNTER — Other Ambulatory Visit: Payer: Self-pay | Admitting: Internal Medicine

## 2024-02-13 ENCOUNTER — Encounter

## 2024-02-13 VITALS — BP 142/78 | HR 55 | Resp 14

## 2024-02-13 DIAGNOSIS — Z006 Encounter for examination for normal comparison and control in clinical research program: Secondary | ICD-10-CM

## 2024-02-13 MED ORDER — STUDY - SOS-AMI - SELATOGREL 16 MG/0.5 ML OR PLACEBO SQ INJECTION (PI-CHRISTOPHER): 16.0000 mg | INJECTION | Freq: Once | SUBCUTANEOUS | 0 refills | Status: AC

## 2024-02-13 NOTE — Research (Signed)
 "                            [facility]     FOLLOW-UP and KIT EXCHANGE  SUBJECT ID:  [id number] Trainer's name: Danyel Griess Trainer's signature: on Delegation of Authority Log Date of the Follow up:  13-Feb-2024  Visit Number: 18   Kits returned: [id number]  Kits received: [id number] [phone]     Q1;  Was the follow up done with the subject?  [x]   YES  []   NO           If NO, check the following:      Q2:    Was the follow up done with a family member               Or caregiver?    []   YES   [x]   NO          Make note about reasons ___________________________________    AUTOINJECTOR LABEL:  Still legible?  [x]   Yes  []   No     Q3:  Did any of the following occur?   []   Death       []   Hospitalization (any cause)         []   Use of autoinjector  Make note about the type of event, and when it occurred. In case of hospitalization: the Location of the hospital and/or the treating physician's contact details  ____________ ____________________________________________________________________ ____________________________________________________________________    Q4:  Did the subject develop any condition which is an          exclusion criterion?  []   YES  [x]   NO   Take note about the occurrence of any exclusion criterion after randomization: _________________________________________________________________ __________________________________________________________________  Q5: Was there any change in subject's antithrombotic therapy?   []   YES  [x]   NO     Take note about any change of antithrombotic treatment: _______________________ ____________________________________________________________________ ____________________________________________________________________      SHARYLE OF THE STUDY-SPECFIC TRAINING  Q6: Did the subject correctly reply to the following questions?       A.  What are common heart attack symptoms?      [x]   YES   []   NO      B.  What has  to be done in case any of those symptoms occurs? [x]   YES  []   NO      C.  What are the main steps to perform a self-injection?  [x]   YES  []   NO             If NO, then report which step/s was/were missing:        []   Choose injection site (abdomen or thigh)       []   Twist cap off       []   Pinch skin and place the study autoinjector       []   Firmly push down and hold for 3 seconds       D. What has to be done immediately after an injection?  [x]   YES   []   NO          If NO, then report which step/s was/were missing:       []   Call  for emergency medical help       []   Show the autoinjector to the emergency medical responder       E.  Does the  subject recall where s/he keeps/ stores the autoinjectors? [x]  YES  []  NO          Note the place of storage and any corrective explanation if needed below __________________________________________________________________ __________________________________________________________________  TRAINING REFRESHER   Q7:  Is a training refresher needed?      []  YES  [x]  NO        If YES, indicate items that have to be refreshed. More than one may apply:               []   Heart attack symptoms             []   Actions to be taken following heart attack symptoms             []   Steps to perform the self-injection and follow-up actions to be taken   []   Other, Specify  _________________________________________     Current Medications[1]     [1]  Current Outpatient Medications:    acetaminophen  (TYLENOL ) 500 MG tablet, Take 325 mg by mouth daily., Disp: , Rfl:    ALPRAZolam  (XANAX ) 0.5 MG tablet, TAKE 1 TABLET IN THE MORNING, AT NOON AND AT BEDTIME., Disp: 90 tablet, Rfl: 0   aspirin  EC 81 MG tablet, Take 81 mg by mouth daily., Disp: , Rfl:    atorvastatin  (LIPITOR) 80 MG tablet, TAKE 1 TABLET ONCE DAILY., Disp: 30 tablet, Rfl: 6   azelastine (ASTELIN) 0.1 % nasal spray, Place 1 spray into both nostrils 2 (two) times daily., Disp: , Rfl:     clopidogrel  (PLAVIX ) 75 MG tablet, Take 1 tablet (75 mg total) by mouth daily., Disp: 90 tablet, Rfl: 3   escitalopram  (LEXAPRO ) 10 MG tablet, TAKE 1 TABLET DAILY IN THE AFTERNOON., Disp: 30 tablet, Rfl: 0   ezetimibe  (ZETIA ) 10 MG tablet, TAKE 1 TABLET ONCE DAILY., Disp: 30 tablet, Rfl: 3   furosemide  (LASIX ) 40 MG tablet, take 1 tablet (40 MILLIGRAM total) by mouth daily as needed. must make appt for further refills., Disp: 7 tablet, Rfl: 0   HYDROcodone -acetaminophen  (NORCO) 10-325 MG tablet, Take 1 tablet by mouth every 6 (six) hours as needed for severe pain., Disp: 40 tablet, Rfl: 0   hydrocortisone (ANUSOL-HC) 2.5 % rectal cream, Place 1 application  rectally 4 (four) times daily as needed (hemorrhoids/discomfort)., Disp: , Rfl:    LINZESS  145 MCG CAPS capsule, TAKE 1 CAPSULE DAILY AS NEEDED FOR CONSTIPATION, Disp: 30 capsule, Rfl: 0   losartan  (COZAAR ) 100 MG tablet, Take 100 mg by mouth daily., Disp: , Rfl:    metFORMIN  (GLUCOPHAGE ) 500 MG tablet, Take 1 tablet (500 mg total) by mouth 2 (two) times daily with a meal., Disp: 60 tablet, Rfl: 5   metoprolol  tartrate (LOPRESSOR ) 25 MG tablet, Take 0.5 tablets (12.5 mg total) by mouth 2 (two) times daily., Disp: 90 tablet, Rfl: 3   Misc Natural Products (NEURIVA PO), Take 1 tablet by mouth daily in the afternoon., Disp: , Rfl:    nitroGLYCERIN  (NITROSTAT ) 0.4 MG SL tablet, DISSOLVE 1 TABLET UNDER THE TONGUE EVERY 5 MINUTES AS NEEDED FOR CHEST PAINS FOR A MAX OF 3 DOSES. CALL 911 AFTER 3 DOSES., Disp: 25 tablet, Rfl: 6   terazosin  (HYTRIN ) 5 MG capsule, TAKE 1 CAPSULE BY MOUTH DAILY IN THE AFTERNOON., Disp: 30 capsule, Rfl: 3   traZODone  (DESYREL ) 50 MG tablet, TAKE 1 TABLET BY MOUTH AT BEDTIME., Disp: 30 tablet, Rfl: 0   amLODipine  (NORVASC ) 5 MG tablet, Take 5 mg by  mouth daily. (Patient not taking: Reported on 02/13/2024), Disp: , Rfl:    Study - [facility] - selatogrel 16 mg/0.5 mL or placebo SQ injection ([facility]), Inject 0.5 mLs (16 mg  total) into the skin once. Inject 0.5 mL subcutaneously in the abdomen or thigh if you experience  symptoms of a heart attack. Call 911 immediately  and seek emergency medical support. Contact [facility] for any questions or concerns regarding this medication., Disp: 1 mL, Rfl: 0  "

## 2024-03-28 ENCOUNTER — Ambulatory Visit: Admitting: Student
